# Patient Record
Sex: Male | Born: 1955
Health system: Southern US, Community
[De-identification: ages and names within clinical notes are randomized; demographics above are authoritative.]

## PROBLEM LIST (undated history)

## (undated) DIAGNOSIS — Z87891 Personal history of nicotine dependence: Secondary | ICD-10-CM

## (undated) DIAGNOSIS — C3492 Malignant neoplasm of unspecified part of left bronchus or lung: Secondary | ICD-10-CM

## (undated) DIAGNOSIS — I639 Cerebral infarction, unspecified: Secondary | ICD-10-CM

## (undated) HISTORY — DX: Malignant neoplasm of unspecified part of left bronchus or lung: C34.92

---

## 2017-04-26 ENCOUNTER — Encounter (HOSPITAL_COMMUNITY): Payer: Self-pay | Admitting: Emergency Medicine

## 2017-04-26 ENCOUNTER — Ambulatory Visit (HOSPITAL_COMMUNITY)
Admission: EM | Admit: 2017-04-26 | Discharge: 2017-04-26 | Disposition: A | Discharge status: Home / Self Care | Payer: Self-pay | Attending: Internal Medicine | Admitting: Internal Medicine

## 2017-04-26 DIAGNOSIS — J029 Acute pharyngitis, unspecified: Secondary | ICD-10-CM

## 2017-04-26 MED ORDER — AMOXICILLIN 500 MG PO CAPS
500.0000 mg | ORAL_CAPSULE | Freq: Three times a day (TID) | ORAL | 0 refills | Status: DC
Start: 2017-04-26 — End: 2017-07-05

## 2017-04-26 NOTE — ED Provider Notes (Signed)
CSN: [id number]     Arrival date & time 04/26/17  1343 History   None    Chief Complaint  Patient presents with  . Sore Throat   (Consider location/radiation/quality/duration/timing/severity/associated sxs/prior Treatment) The history is provided by the patient. No language interpreter was used.  Sore Throat  This is a new problem. The problem occurs constantly. The problem has not changed since onset.Nothing aggravates the symptoms. Nothing relieves the symptoms. He has tried nothing for the symptoms. The treatment provided no relief.  Pt complains of a sore throat.  Pt has pain with swallowing  History reviewed. No pertinent past medical history. History reviewed. No pertinent surgical history. History reviewed. No pertinent family history. Social History  Substance Use Topics  . Smoking status: Former Research scientist (life sciences)  . Smokeless tobacco: Not on file     Comment: quit smoking in 2017  . Alcohol use Yes    Review of Systems  All other systems reviewed and are negative.   Allergies  Patient has no known allergies.  Home Medications   Prior to Admission medications   Medication Sig Start Date End Date Taking? Authorizing Provider  amoxicillin (AMOXIL) 500 MG capsule Take 1 capsule (500 mg total) by mouth 3 (three) times daily. 04/26/17   Fransico Meadow, PA-C   Meds Ordered and Administered this Visit  Medications - No data to display  BP (!) 206/113 (BP Location: Right Arm) Comment: rn notified  Pulse 99   Temp 98.9 F (37.2 C) (Oral)   Resp 16   SpO2 100%  No data found.   Physical Exam  Constitutional: He appears well-developed and well-nourished.  HENT:  Head: Normocephalic and atraumatic.  Right Ear: External ear normal.  Left Ear: External ear normal.  Nose: Nose normal.  Erythema throat, no exudate, raspy voice.   Eyes: Conjunctivae are normal.  Neck: Neck supple.  Cardiovascular: Normal rate and regular rhythm.   No murmur heard. Pulmonary/Chest: Effort  normal and breath sounds normal. No respiratory distress.  Abdominal: Soft. There is no tenderness.  Musculoskeletal: He exhibits no edema.  Neurological: He is alert.  Skin: Skin is warm and dry.  Psychiatric: He has a normal mood and affect.  Nursing note and vitals reviewed.   Urgent Care Course     Procedures (including critical care time)  Labs Review Labs Reviewed - No data to display  Imaging Review No results found.   Visual Acuity Review  Right Eye Distance:   Left Eye Distance:   Bilateral Distance:    Right Eye Near:   Left Eye Near:    Bilateral Near:         MDM   1. Pharyngitis, unspecified etiology    Meds ordered this encounter  Medications  . amoxicillin (AMOXIL) 500 MG capsule    Sig: Take 1 capsule (500 mg total) by mouth 3 (three) times daily.    Dispense:  30 capsule    Refill:  0    Order Specific Question:   Supervising Provider    Answer:   Sherlene Shams [[id number]]  An After Visit Summary was printed and given to the patient.     Fransico Meadow, [place] 04/26/17 [phone]

## 2017-04-26 NOTE — ED Triage Notes (Signed)
Sore throat that started Wednesday and voice is raspy.  Throat hurts with a lot of talking

## 2017-04-26 NOTE — Discharge Instructions (Signed)
Return if any problems.

## 2017-07-05 ENCOUNTER — Ambulatory Visit (HOSPITAL_COMMUNITY)
Admission: EM | Admit: 2017-07-05 | Discharge: 2017-07-05 | Disposition: A | Discharge status: Nursing Home (Includes ICF) | Payer: Self-pay | Attending: Internal Medicine | Admitting: Internal Medicine

## 2017-07-05 ENCOUNTER — Encounter (HOSPITAL_COMMUNITY): Payer: Self-pay | Admitting: Emergency Medicine

## 2017-07-05 ENCOUNTER — Inpatient Hospital Stay (HOSPITAL_COMMUNITY)
Admission: EM | Admit: 2017-07-05 | Discharge: 2017-07-08 | DRG: 167 | Disposition: A | Discharge status: Home / Self Care | Payer: Self-pay | Attending: Internal Medicine | Admitting: Internal Medicine

## 2017-07-05 ENCOUNTER — Ambulatory Visit (INDEPENDENT_AMBULATORY_CARE_PROVIDER_SITE_OTHER): Payer: Self-pay

## 2017-07-05 ENCOUNTER — Emergency Department (HOSPITAL_COMMUNITY): Payer: Self-pay

## 2017-07-05 DIAGNOSIS — J9801 Acute bronchospasm: Secondary | ICD-10-CM

## 2017-07-05 DIAGNOSIS — Q278 Other specified congenital malformations of peripheral vascular system: Secondary | ICD-10-CM

## 2017-07-05 DIAGNOSIS — R9389 Abnormal findings on diagnostic imaging of other specified body structures: Secondary | ICD-10-CM

## 2017-07-05 DIAGNOSIS — R0982 Postnasal drip: Secondary | ICD-10-CM

## 2017-07-05 DIAGNOSIS — R000 Tachycardia, unspecified: Secondary | ICD-10-CM | POA: Diagnosis present

## 2017-07-05 DIAGNOSIS — R042 Hemoptysis: Secondary | ICD-10-CM | POA: Diagnosis present

## 2017-07-05 DIAGNOSIS — R0602 Shortness of breath: Secondary | ICD-10-CM

## 2017-07-05 DIAGNOSIS — R030 Elevated blood-pressure reading, without diagnosis of hypertension: Secondary | ICD-10-CM | POA: Diagnosis present

## 2017-07-05 DIAGNOSIS — J9811 Atelectasis: Secondary | ICD-10-CM | POA: Diagnosis present

## 2017-07-05 DIAGNOSIS — R499 Unspecified voice and resonance disorder: Secondary | ICD-10-CM

## 2017-07-05 DIAGNOSIS — R938 Abnormal findings on diagnostic imaging of other specified body structures: Secondary | ICD-10-CM

## 2017-07-05 DIAGNOSIS — I10 Essential (primary) hypertension: Secondary | ICD-10-CM | POA: Diagnosis present

## 2017-07-05 DIAGNOSIS — J9859 Other diseases of mediastinum, not elsewhere classified: Secondary | ICD-10-CM

## 2017-07-05 DIAGNOSIS — R490 Dysphonia: Secondary | ICD-10-CM | POA: Diagnosis present

## 2017-07-05 DIAGNOSIS — Z825 Family history of asthma and other chronic lower respiratory diseases: Secondary | ICD-10-CM

## 2017-07-05 DIAGNOSIS — Z87891 Personal history of nicotine dependence: Secondary | ICD-10-CM

## 2017-07-05 DIAGNOSIS — C3412 Malignant neoplasm of upper lobe, left bronchus or lung: Principal | ICD-10-CM | POA: Diagnosis present

## 2017-07-05 HISTORY — DX: Personal history of nicotine dependence: Z87.891

## 2017-07-05 LAB — CBC WITH DIFFERENTIAL/PLATELET
BASOS PCT: 1 %
Basophils Absolute: 0 10*3/uL (ref 0.0–0.1)
EOS ABS: 0.2 10*3/uL (ref 0.0–0.7)
EOS PCT: 2 %
HCT: 46.4 % (ref 39.0–52.0)
HEMOGLOBIN: 15.8 g/dL (ref 13.0–17.0)
Lymphocytes Relative: 21 %
Lymphs Abs: 1.7 10*3/uL (ref 0.7–4.0)
MCH: 32.8 pg (ref 26.0–34.0)
MCHC: 34.1 g/dL (ref 30.0–36.0)
MCV: 96.5 fL (ref 78.0–100.0)
MONOS PCT: 9 %
Monocytes Absolute: 0.7 10*3/uL (ref 0.1–1.0)
NEUTROS PCT: 67 %
Neutro Abs: 5.5 10*3/uL (ref 1.7–7.7)
PLATELETS: 312 10*3/uL (ref 150–400)
RBC: 4.81 MIL/uL (ref 4.22–5.81)
RDW: 13.7 % (ref 11.5–15.5)
WBC: 8.2 10*3/uL (ref 4.0–10.5)

## 2017-07-05 LAB — COMPREHENSIVE METABOLIC PANEL
ALBUMIN: 4.1 g/dL (ref 3.5–5.0)
ALK PHOS: 86 U/L (ref 38–126)
ALT: 15 U/L — ABNORMAL LOW (ref 17–63)
ANION GAP: 9 (ref 5–15)
AST: 21 U/L (ref 15–41)
BUN: <5 mg/dL — ABNORMAL LOW (ref 6–20)
CALCIUM: 9.5 mg/dL (ref 8.9–10.3)
CHLORIDE: 96 mmol/L — AB (ref 101–111)
CO2: 31 mmol/L (ref 22–32)
Creatinine, Ser: 0.84 mg/dL (ref 0.61–1.24)
GFR calc non Af Amer: >60 mL/min (ref >60)
GLUCOSE: 103 mg/dL — AB (ref 65–99)
Potassium: 3.5 mmol/L (ref 3.5–5.1)
SODIUM: 136 mmol/L (ref 135–145)
Total Bilirubin: 1 mg/dL (ref 0.3–1.2)
Total Protein: 7.5 g/dL (ref 6.5–8.1)

## 2017-07-05 MED ORDER — HYDRALAZINE HCL 20 MG/ML IJ SOLN: 5.0000 mg | INTRAMUSCULAR | Status: DC | PRN

## 2017-07-05 MED ORDER — ACETAMINOPHEN 325 MG PO TABS: 650.0000 mg | ORAL_TABLET | Freq: Four times a day (QID) | ORAL | Status: DC | PRN

## 2017-07-05 MED ORDER — ACETAMINOPHEN 650 MG RE SUPP: 650.0000 mg | Freq: Four times a day (QID) | RECTAL | Status: DC | PRN

## 2017-07-05 MED ORDER — ONDANSETRON HCL 4 MG/2ML IJ SOLN: 4.0000 mg | Freq: Three times a day (TID) | INTRAMUSCULAR | Status: DC | PRN

## 2017-07-05 MED ORDER — ZOLPIDEM TARTRATE 5 MG PO TABS: 5.0000 mg | ORAL_TABLET | Freq: Every evening | ORAL | Status: DC | PRN

## 2017-07-05 MED ORDER — ALBUTEROL SULFATE (2.5 MG/3ML) 0.083% IN NEBU
INHALATION_SOLUTION | RESPIRATORY_TRACT | Status: AC
  Filled 2017-07-05: qty 3

## 2017-07-05 MED ORDER — IPRATROPIUM-ALBUTEROL 0.5-2.5 (3) MG/3ML IN SOLN
3.0000 mL | Freq: Once | RESPIRATORY_TRACT | Status: AC
  Administered 2017-07-05: 3 mL via RESPIRATORY_TRACT
  Filled 2017-07-05: qty 3

## 2017-07-05 MED ORDER — IPRATROPIUM-ALBUTEROL 0.5-2.5 (3) MG/3ML IN SOLN
3.0000 mL | Freq: Once | RESPIRATORY_TRACT | Status: AC
  Administered 2017-07-05: 3 mL via RESPIRATORY_TRACT

## 2017-07-05 MED ORDER — IPRATROPIUM-ALBUTEROL 0.5-2.5 (3) MG/3ML IN SOLN
RESPIRATORY_TRACT | Status: AC
  Filled 2017-07-05: qty 3

## 2017-07-05 MED ORDER — SODIUM CHLORIDE 0.9 % IV SOLN
INTRAVENOUS | Status: DC
  Administered 2017-07-05 – 2017-07-07 (×4): via INTRAVENOUS

## 2017-07-05 MED ORDER — AMLODIPINE BESYLATE 5 MG PO TABS
5.0000 mg | ORAL_TABLET | Freq: Every day | ORAL | Status: DC
  Administered 2017-07-05 – 2017-07-06 (×2): 5 mg via ORAL
  Filled 2017-07-05 (×2): qty 1

## 2017-07-05 MED ORDER — GUAIFENESIN 100 MG/5ML PO SYRP
200.0000 mg | ORAL_SOLUTION | ORAL | Status: DC | PRN
  Filled 2017-07-05: qty 10

## 2017-07-05 MED ORDER — DEXAMETHASONE SODIUM PHOSPHATE 10 MG/ML IJ SOLN
10.0000 mg | Freq: Once | INTRAMUSCULAR | Status: AC
  Administered 2017-07-05: 10 mg via INTRAVENOUS
  Filled 2017-07-05: qty 1

## 2017-07-05 MED ORDER — LEVALBUTEROL HCL 1.25 MG/0.5ML IN NEBU
1.2500 mg | INHALATION_SOLUTION | Freq: Four times a day (QID) | RESPIRATORY_TRACT | Status: DC
  Filled 2017-07-05 (×2): qty 0.5

## 2017-07-05 MED ORDER — IOPAMIDOL (ISOVUE-300) INJECTION 61%
INTRAVENOUS | Status: AC
  Administered 2017-07-05: 100 mL
  Filled 2017-07-05: qty 75

## 2017-07-05 NOTE — ED Notes (Signed)
Radiology called needing to give report on chest x-ray... Notified Youlanda Roys, NP who sts he has seen the report already.

## 2017-07-05 NOTE — H&P (Signed)
History and Physical    Levi Brown QPY:[id number] DOB: July 28, 1956 DOA: 07/05/2017  Referring MD/NP/PA:   PCP: Patient, No Pcp Per   Patient coming from:  The patient is coming from home.  At baseline, pt is independent for most of ADL.   Chief Complaint: Shortness of breath, cough, hemoptysis and hoarseness  HPI: Levi Brown is a 61 y.o. male without significant past medical history except for former smoking, who presents with shortness of breath, cough, hemoptysis or hoarseness.   Patient states that he has been having dry cough, SOB, hoarse voice for 6 weeks, which has been progressively getting worse. Patient does not have chest pain. No fever or chills. Occasionally he has mild hemoptysis with blood-streaked sputum. No night sweats. He was treated with Amoxicillin recently with no improvement in his symptoms. No calf tenderness. He denies PND or orthopnea. Patient does not have nausea, vomiting, diarrhea, abdominal pain, symptoms of UTI or unilateral weakness.  Pt was seen in urgent care and found to have significantly abnormal chest x-ray concerning for thoracic aortic aneurysm versus cancer, therefore sent to ED for further evaluation and treatment. Patient states that he lost 5 pounds in the past 5 months. Patient states that he does not have a primary care physician. He smoked 30-pack-years and quit 1.5 years ago.   ED Course: pt was found to have WBC 22, electrolytes renal function okay, temperature 98.6, tachycardia, oxygen saturation 93% on room air. Patient is admitted to telemetry bed as inpatient.  CT of the chest with contrast showed: 1. 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 There is also mass effect and significant narrowing of the left main pulmonary artery. Differential considerations include lymphoma, thymic  malignancy, and potentially small cell lung cancer. No evidence of distant metastatic disease. 2. Complete collapse of the left upper lobe. It is difficult to identify where the primary mediastinal mass ends and atelectatic lung begins. Direct invasion into the lung is not excluded.  3. 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 MSK: No muscle spasm, no deformity, no limitation of range of movement in spin Heme: No easy bruising.  Travel history: No recent long distant travel.  Allergy: No Known Allergies  Past Medical History:  Diagnosis Date  . Former smoker     History reviewed. No pertinent surgical history.  Social History:  reports that he has quit smoking. He has never used smokeless tobacco. He reports that he drinks alcohol. He reports that he does not use drugs.  Family History:  Family History  Problem Relation Age of Onset  . Asthma Mother   . COPD Mother      Prior to Admission medications   Medication Sig Start Date End Date Taking? Authorizing Provider  amoxicillin (AMOXIL) 500 MG capsule Take 1 capsule (500 mg total) by mouth 3 (three) times daily. 04/26/17  Fransico Meadow, [place]    Physical Exam: Vitals:   07/05/17 2215 07/05/17 2258 07/05/17 2300 07/05/17 2317  BP: (!) 178/115 (!) 183/120 (!) 183/120   Pulse: (!) 106  99   Resp:   16   Temp:   98.4 F (36.9 C)   TempSrc:   Oral   SpO2: 93%  93%   Weight:    73 kg (160 lb  14.4 oz)  Height:   5\' 6"  (1.676 m)    General: Not in acute distress HEENT:       Eyes: PERRL, EOMI, no scleral icterus.       ENT: No discharge from the ears and nose, no pharynx injection, no tonsillar enlargement.        Neck: No JVD, no bruit, no mass felt. Heme: No neck lymph node enlargement. Cardiac: S1/S2, RRR, No murmurs, No gallops or rubs. Respiratory: has decreased air movement on the left side. No rales, wheezing, rhonchi or rubs. GI: Soft, nondistended, nontender, no rebound pain, no organomegaly, BS present. GU: No hematuria Ext: No pitting leg edema bilaterally. 2+DP/PT pulse bilaterally. Musculoskeletal: No joint deformities, No joint redness or warmth, no limitation of ROM in spin. Skin: No rashes.  Neuro: Alert, oriented X3, cranial nerves II-XII grossly intact, moves all extremities normally. Psych: Patient is not psychotic, no suicidal or hemocidal ideation.  Labs on Admission: I have personally reviewed following labs and imaging studies  CBC:  Recent Labs Lab 07/05/17 1759  WBC 8.2  NEUTROABS 5.5  HGB 15.8  HCT 46.4  MCV 96.5  PLT 778   Basic Metabolic Panel:  Recent Labs Lab 07/05/17 1759  NA 136  K 3.5  CL 96*  CO2 31  GLUCOSE 103*  BUN <5*  CREATININE 0.84  CALCIUM 9.5   GFR: Estimated Creatinine Clearance: 85.4 mL/min (by C-G formula based on SCr of 0.84 mg/dL). Liver Function Tests:  Recent Labs Lab 07/05/17 1759  AST 21  ALT 15*  ALKPHOS 86  BILITOT 1.0  PROT 7.5  ALBUMIN 4.1   No results for input(s): LIPASE, AMYLASE in the last 168 hours. No results for input(s): AMMONIA in the last 168 hours. Coagulation Profile:  Recent Labs Lab 07/05/17 2346  INR 1.02   Cardiac Enzymes: No results for input(s): CKTOTAL, CKMB, CKMBINDEX, TROPONINI in the last 168 hours. BNP (last 3 results) No results for input(s): PROBNP in the last 8760 hours. HbA1C: No results for input(s): HGBA1C in the last 72 hours. CBG: No results  for input(s): GLUCAP in the last 168 hours. Lipid Profile: No results for input(s): CHOL, HDL, LDLCALC, TRIG, CHOLHDL, LDLDIRECT in the last 72 hours. Thyroid Function Tests: No results for input(s): TSH, T4TOTAL, FREET4, T3FREE, THYROIDAB in the last 72 hours. Anemia Panel: No results for input(s): VITAMINB12, FOLATE, FERRITIN, TIBC, IRON, RETICCTPCT in the last 72 hours. Urine analysis: No results found for: COLORURINE, APPEARANCEUR, LABSPEC, PHURINE, GLUCOSEU, HGBUR, BILIRUBINUR, KETONESUR, PROTEINUR, UROBILINOGEN, NITRITE, LEUKOCYTESUR Sepsis Labs: @LABRCNTIP (procalcitonin:4,lacticidven:4) )No results found for this or any previous visit (from the past 240 hour(s)).   Radiological Exams on Admission: Dg Chest 2 View  Result Date: 07/05/2017 CLINICAL DATA:  Shortness of breath and wheezing. EXAM: CHEST  2 VIEW COMPARISON:  None. FINDINGS: Marked enlargement of the mediastinum and the patient may even have a right aortic arch. However, the mediastinal tissue is more prominent on the left side. Trachea is midline. Heart size appears to be grossly normal. There is mild elevation of the left  hemidiaphragm. Lungs are clear without airspace disease or pulmonary edema. No large pleural effusions. IMPRESSION: Enlargement and abnormal appearance of the mediastinum. Differential diagnosis would include a mediastinal/lung mass, lymphadenopathy and/or thoracic aortic aneurysm. Recommend further characterization with a chest CT with IV contrast. These results will be called to the ordering clinician or representative by the Radiologist Assistant, and communication documented in the PACS or zVision Dashboard. Electronically Signed   By: Markus Daft M.D.   On: 07/05/2017 13:20   Ct Chest W Contrast  Result Date: 07/05/2017 CLINICAL DATA:  61 year old male with shortness of breath for the past month and hoarseness for the past month and a half EXAM: CT CHEST WITH CONTRAST TECHNIQUE: Multidetector CT imaging of  the chest was performed during intravenous contrast administration. CONTRAST:  195mL ISOVUE-300 IOPAMIDOL (ISOVUE-300) INJECTION 61% COMPARISON:  Chest x-ray obtained earlier today FINDINGS: Cardiovascular: Right-sided aortic arch with aberrant left subclavian artery. Diffuse narrowing of the left main pulmonary artery secondary to local mass effect from the mediastinal mass. No evidence of aortic aneurysm. The heart is normal in size. No pericardial effusion. Mediastinum/Nodes: Ill-defined mediastinal mass which is difficult to measure precisely. The mass measures at least 6.5 x 5.2 x 6.6 cm and is contiguous with collapse of the left upper lobe. The mass completely obliterates the left upper lobe bronchus and likely impinges upon the left lower lobe bronchus. No additional mediastinal adenopathy identified. Lungs/Pleura: Complete collapse of the left upper lobe. There is no aeration within the left upper lobe. Secondary hyperinflation of the left lower lobe. The aerated portion of the lungs are clear. No pulmonary mass or nodules. Upper Abdomen: Visualized upper abdominal organs are unremarkable. Musculoskeletal: No acute fracture or aggressive appearing lytic or blastic osseous lesion. IMPRESSION: 1. 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 There is also mass effect and significant narrowing of the left main pulmonary artery. Differential considerations include lymphoma, thymic malignancy, and potentially small cell lung cancer. No evidence of distant metastatic disease. 2. Complete collapse of the left upper lobe. It is difficult to identify where the primary mediastinal mass ends and atelectatic lung begins. Direct invasion into the lung is not excluded. 3. 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 Electronically Signed   By: Jacqulynn Cadet M.D.   On: 07/05/2017 20:58     EKG: Independently reviewed.  Sinus rhythm, QTC 482, T-wave inversion in V1-V2, RAE.   Assessment/Plan Principal Problem:   Mediastinal mass Active Problems:   Elevated blood pressure reading   Mediastinal mass: Patient's shortness of breath, hemoptysis and hoarseness are due to mediastinal mass as shown by CT of chest. It is very concerning for malignancy, ifferential considerations include lymphoma, thymic malignancy, and potentially small cell lung cancer. No evidence of distant metastatic disease on CT scan.  -Will admit to tele bed as inpt -prn . Albuterol nebulizers for SOB -Robitussin when necessary for cough -check LDH and peripheral smear -pt received one dose of Decadron in ED -check INR/PTT -please call oncology in AM.  Elevated blood pressure reading: Blood pressure is elevated to 180/117. Patient may have undiagnosed hypertension -Start amlodipine 5 mg daily -IVF hydration when necessary  DVT ppx: SCD Code Status: Full code Family Communication: Yes, patient's sister  at bed side Disposition Plan:  Anticipate discharge back to previous home environment Consults called: none  Admission status:  Inpatient/tele    Date of Service 07/06/2017    Ivor Costa [facility] Pager [phone]  If 7PM-7AM, please contact night-coverage [email or web address] Password [email or web address] 07/06/2017, 1:20 AM

## 2017-07-05 NOTE — ED Provider Notes (Signed)
[facility] DEPT Provider Note   CSN: [id number] Arrival date & time: 07/05/17  1441     History   Chief Complaint Chief Complaint  Patient presents with  . Laryngitis     HPI   Blood pressure (!) 179/109, pulse 100, temperature 98.6 F (37 C), temperature source Oral, resp. rate 15, SpO2 99 %.  Levi Brown is a 61 y.o. male sent from urgent care for further evaluation of significantly abnormal chest x-ray, findings concerning for [place] differential includes a thoracic aortic aneurysm versus cancer. Patient states that he does not have a primary care physician, he smoked 30-pack-years and quit 1.5 years ago. He states that he developed a hoarse voice 1.5 months ago, he was seen and given a prescription for an antibiotic which he completed with no improvement in his symptoms. He reports a shortness of breath with increasing bilateral peripheral edema onset several weeks ago. He denies any unilateral swelling, calf pain. He states that he had difficulty sleeping last night because he felt so short of breath, he denies PND or orthopnea. He denies chest pain, syncope. He is not on any medications, he states that he feels improvement in his shortness of breath after nebulizer treatment at urgent care. He states that the shortness of breath is exacerbated by exertion, he works doing Artist. He denies unintentional weight loss, easy bruising or bleeding (he notes that he's had a blood-streaked sputum on his cough and the AM over the last week), night sweats.   Past Medical History:  Diagnosis Date  . Former smoker     Patient Active Problem List   Diagnosis Date Noted  . 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 Elevated blood pressure reading 07/05/2017    History reviewed. No pertinent surgical history.     Home Medications    Prior to Admission medications   Medication Sig Start Date End Date Taking? Authorizing Provider  acetaminophen (TYLENOL) 325 MG tablet  Take 325 mg by mouth every 6 (six) hours as needed for moderate pain.   Yes [provider]  Dextromethorphan Polistirex (COUGH DM PO) Take 30 mLs by mouth daily as needed (cough).   Yes [provider]  Multiple Vitamins-Minerals (DRY EYE FORMULA PO) Place 1 drop into both eyes daily.   Yes [provider]    Family History Family History  Problem Relation Age of Onset  . Asthma Mother   . COPD Mother     Social History Social History  Substance Use Topics  . Smoking status: Former Research scientist (life sciences)  . Smokeless tobacco: Never Used     Comment: quit smoking in 2017  . Alcohol use Yes     Allergies   Patient has no known allergies.   Review of Systems Review of Systems  A complete review of systems was obtained and all systems are negative except as noted in the HPI and PMH.   Physical Exam Updated Vital Signs BP (!) 183/120 (BP Location: Left Arm)   Pulse 99   Temp 98.4 F (36.9 C) (Oral)   Resp 16   Ht 5\' 6"  (1.676 m)   Wt 73 kg (160 lb 14.4 oz) Comment: a scale  SpO2 93%   BMI 25.97 kg/m   Physical Exam  Constitutional: He is oriented to person, place, and time. He appears well-developed and well-nourished. No distress.  HENT:  Head: Normocephalic and atraumatic.  Mouth/Throat: Oropharynx is clear and moist.  Eyes: Pupils are equal, round, and reactive to light.  Conjunctivae and EOM are normal.  Neck: Normal range of motion.  Cardiovascular: Normal rate, regular rhythm and intact distal pulses.   Pulses equal bilaterally.  Pulmonary/Chest: Effort normal. He has wheezes.  Scattered expiratory wheezing  Abdominal: Soft. There is no tenderness.  Musculoskeletal: Normal range of motion. He exhibits edema.  1+ edema to midshin bilaterally, Homans sign negative  Neurological: He is alert and oriented to person, place, and time.  Skin: He is not diaphoretic.  Psychiatric: He has a normal mood and affect.  Nursing note and vitals  reviewed.    ED Treatments / Results  Labs (all labs ordered are listed, but only abnormal results are displayed) Labs Reviewed  COMPREHENSIVE METABOLIC PANEL - Abnormal; Notable for the following:       Result Value   Chloride 96 (*)    Glucose, Bld 103 (*)    BUN <5 (*)    ALT 15 (*)    All other components within normal limits  CBC WITH DIFFERENTIAL/PLATELET  LACTATE DEHYDROGENASE  PATHOLOGIST SMEAR REVIEW  SAVE SMEAR  PROTIME-INR  APTT  HIV ANTIBODY (ROUTINE TESTING)  BASIC METABOLIC PANEL  CBC    EKG  EKG Interpretation None       Radiology Dg Chest 2 View  Result Date: 07/05/2017 CLINICAL DATA:  Shortness of breath and wheezing. EXAM: CHEST  2 VIEW COMPARISON:  None. FINDINGS: Marked enlargement of the mediastinum and the patient may even have a right aortic arch. However, the mediastinal tissue is more prominent on the left side. Trachea is midline. Heart size appears to be grossly normal. There is mild elevation of the left hemidiaphragm. Lungs are clear without airspace disease or pulmonary edema. No large pleural effusions. IMPRESSION: Enlargement and abnormal appearance of the mediastinum. Differential diagnosis would include a mediastinal/lung mass, lymphadenopathy and/or thoracic aortic aneurysm. Recommend further characterization with a chest CT with IV contrast. These results will be called to the ordering clinician or representative by the Radiologist Assistant, and communication documented in the PACS or zVision Dashboard. Electronically Signed   By: Markus Daft M.D.   On: 07/05/2017 13:20   Ct Chest W Contrast  Result Date: 07/05/2017 CLINICAL DATA:  61 year old male with shortness of breath for the past month and hoarseness for the past month and a half EXAM: CT CHEST WITH CONTRAST TECHNIQUE: Multidetector CT imaging of the chest was performed during intravenous contrast administration. CONTRAST:  142mL ISOVUE-300 IOPAMIDOL (ISOVUE-300) INJECTION 61%  COMPARISON:  Chest x-ray obtained earlier today FINDINGS: Cardiovascular: Right-sided aortic arch with aberrant left subclavian artery. Diffuse narrowing of the left main pulmonary artery secondary to local mass effect from the mediastinal mass. No evidence of aortic aneurysm. The heart is normal in size. No pericardial effusion. Mediastinum/Nodes: Ill-defined mediastinal mass which is difficult to measure precisely. The mass measures at least 6.5 x 5.2 x 6.6 cm and is contiguous with collapse of the left upper lobe. The mass completely obliterates the left upper lobe bronchus and likely impinges upon the left lower lobe bronchus. No additional mediastinal adenopathy identified. Lungs/Pleura: Complete collapse of the left upper lobe. There is no aeration within the left upper lobe. Secondary hyperinflation of the left lower lobe. The aerated portion of the lungs are clear. No pulmonary mass or nodules. Upper Abdomen: Visualized upper abdominal organs are unremarkable. Musculoskeletal: No acute fracture or aggressive appearing lytic or blastic osseous lesion. IMPRESSION: 1. 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 There is also mass effect and significant narrowing of the left main pulmonary artery. Differential considerations include lymphoma, thymic malignancy, and potentially small cell lung cancer. No evidence of distant metastatic disease. 2. Complete collapse of the left upper lobe. It is difficult to identify where the primary mediastinal mass ends and atelectatic lung begins. Direct invasion into the lung is not excluded. 3. 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 Electronically Signed   By: Jacqulynn Cadet M.D.   On: 07/05/2017 20:58    Procedures Procedures (including critical care time)  Medications Ordered in ED Medications   hydrALAZINE (APRESOLINE) injection 5 mg (not administered)  ondansetron (ZOFRAN) injection 4 mg (not administered)  amLODipine (NORVASC) tablet 5 mg (not administered)  levalbuterol (XOPENEX) nebulizer solution 1.25 mg (not administered)  guaifenesin (ROBITUSSIN) 100 MG/5ML syrup 200 mg (not administered)  0.9 %  sodium chloride infusion (not administered)  acetaminophen (TYLENOL) tablet 650 mg (not administered)    Or  acetaminophen (TYLENOL) suppository 650 mg (not administered)  zolpidem (AMBIEN) tablet 5 mg (not administered)  dexamethasone (DECADRON) injection 10 mg (10 mg Intravenous Given 07/05/17 1902)  ipratropium-albuterol (DUONEB) 0.5-2.5 (3) MG/3ML nebulizer solution 3 mL (3 mLs Nebulization Given 07/05/17 1902)  iopamidol (ISOVUE-300) 61 % injection (100 mLs  Contrast Given 07/05/17 2030)     Initial Impression / Assessment and Plan / ED Course  I have reviewed the triage vital signs and the nursing notes.  Pertinent labs & imaging results that were available during my care of the patient were reviewed by me and considered in my medical decision making (see chart for details).     Vitals:   07/05/17 2215 07/05/17 2258 07/05/17 2300 07/05/17 2317  BP: (!) 178/115 (!) 183/120 (!) 183/120   Pulse: (!) 106  99   Resp:   16   Temp:   98.4 F (36.9 C)   TempSrc:   Oral   SpO2: 93%  93%   Weight:    73 kg (160 lb 14.4 oz)  Height:   5\' 6"  (1.676 m)     Medications  hydrALAZINE (APRESOLINE) injection 5 mg (not administered)  ondansetron (ZOFRAN) injection 4 mg (not administered)  amLODipine (NORVASC) tablet 5 mg (not administered)  levalbuterol (XOPENEX) nebulizer solution 1.25 mg (not administered)  guaifenesin (ROBITUSSIN) 100 MG/5ML syrup 200 mg (not administered)  0.9 %  sodium chloride infusion (not administered)  acetaminophen (TYLENOL) tablet 650 mg (not administered)    Or  acetaminophen (TYLENOL) suppository 650 mg (not administered)  zolpidem (AMBIEN)  tablet 5 mg (not administered)  dexamethasone (DECADRON) injection 10 mg (10 mg Intravenous Given 07/05/17 1902)  ipratropium-albuterol (DUONEB) 0.5-2.5 (3) MG/3ML nebulizer solution 3 mL (3 mLs Nebulization Given 07/05/17 1902)  iopamidol (ISOVUE-300) 61 % injection (100 mLs  Contrast Given 07/05/17 2030)    Levi Brown is 61 y.o. male presenting with Hoarse voice, increasing peripheral edema, shortness of breath and grossly abnormal chest x-ray from urgent care with widened mediastinum. Patient with no chest pain, equal pulses, extensive smoking history, likely cancer. Patient's blood pressure is elevated he states that he's never had a diagnosis of hypertension and he is nervous given the abnormal chest x-ray. Will further evaluate with chest CT, I invited him to call family members to come to the emergency department for support. Patient is still wheezing, will give Decadron and second nebulizer treatment. Blood work pending prior  to CT  CAT scan with a 6-1/2 cm amorphous mass in the left mediastinum. Differential includes lymphoma versus thymic malignancy versus small cell lung cancer. No evidence of metastatic disease. Pilar Plate discussion with patient and his sister, will need admission for biopsy and staging. Patient with no insurance or outpatient care. Unassigned admission to [facility] hospitalist Dr. Blaine Hamper.    Final Clinical Impressions(s) / ED Diagnoses   Final diagnoses:  Mediastinal mass    New Prescriptions Current Discharge Medication List       Waynetta Pean 07/05/17 2331    Davonna Belling, MD 07/06/17 0002

## 2017-07-05 NOTE — Discharge Instructions (Signed)
To go to emergency department now for evaluation of chest abnormalities.

## 2017-07-05 NOTE — ED Provider Notes (Signed)
CSN: [id number]     Arrival date & time 07/05/17  1102 History   First MD Initiated Contact with Patient 07/05/17 1224     Chief Complaint  Patient presents with  . Shortness of Breath   (Consider location/radiation/quality/duration/timing/severity/associated sxs/prior Treatment) 61 year old male complaining of change of voice since May and a foreign body sensation in throat. States he lost his voice several weeks ago. Denies perceived drainage. Denies shortness of breath although he states it is hard to get a good breath. In addition he states that he frequently spits up blood, but especially in the morning when he gets up or brushes his teeth. He does not smoke he does not have asthma. 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 Currently no acute distress. Denies chest pain, abdominal pain, nausea or vomiting, focal weakness or paresthesias.      History reviewed. No pertinent past medical history. History reviewed. No pertinent surgical history. History reviewed. No pertinent family history. Social History  Substance Use Topics  . Smoking status: Former Research scientist (life sciences)  . Smokeless tobacco: Never Used     Comment: quit smoking in 2017  . Alcohol use Yes    Review of Systems  Constitutional: Positive for activity change. Negative for fever.  HENT: Positive for sore throat and voice change. Negative for congestion, ear pain and postnasal drip.   Eyes: Negative.   Respiratory: Negative for cough.        Denies having shortness of breath but states it is hard to breathe and get a good breath.  Cardiovascular: Negative for chest pain.  Gastrointestinal: Negative.   Neurological: Negative.   All other systems reviewed and are negative.   Allergies  Patient has no known allergies.  Home Medications   Prior to Admission medications   Medication Sig Start Date End Date Taking? Authorizing Provider  amoxicillin (AMOXIL) 500 MG capsule Take 1 capsule (500 mg total) by  mouth 3 (three) times daily. 04/26/17   Fransico Meadow, PA-C   Meds Ordered and Administered this Visit   Medications  ipratropium-albuterol (DUONEB) 0.5-2.5 (3) MG/3ML nebulizer solution 3 mL (3 mLs Nebulization Given 07/05/17 1317)    BP 104/75 (BP Location: Left Arm)   Pulse (!) 116   Temp 98.5 F (36.9 C) (Oral)   Resp 20   SpO2 95%  No data found.   Physical Exam  Constitutional: He is oriented to person, place, and time. He appears well-developed and well-nourished. No distress.  HENT:  Oropharynx with minor erythema and scant clear PND, no exudate.  Eyes: EOM are normal.  Neck: Normal range of motion. Neck supple. No thyromegaly present.  Cardiovascular: Normal rate, regular rhythm and normal heart sounds.   Pulmonary/Chest: Effort normal. No respiratory distress.  Breath sounds with expiratory wheeze bilaterally. Prolonged expiratory phase. No rhonchi. Minor coarseness with forced cough.  Musculoskeletal: He exhibits no edema.  Lymphadenopathy:    He has no cervical adenopathy.  Neurological: He is alert and oriented to person, place, and time. He exhibits normal muscle tone.  Skin: Skin is warm and dry.  Psychiatric: He has a normal mood and affect.  Nursing note and vitals reviewed.   Urgent Care Course     Procedures (including critical care time)  Labs Review Labs Reviewed - No data to display  Imaging Review Dg Chest 2 View  Result Date: 07/05/2017 CLINICAL DATA:  Shortness of breath and wheezing. EXAM: CHEST  2 VIEW COMPARISON:  None. FINDINGS: Marked enlargement of  the mediastinum and the patient may even have a right aortic arch. However, the mediastinal tissue is more prominent on the left side. Trachea is midline. Heart size appears to be grossly normal. There is mild elevation of the left hemidiaphragm. Lungs are clear without airspace disease or pulmonary edema. No large pleural effusions. IMPRESSION: Enlargement and abnormal appearance of the  mediastinum. Differential diagnosis would include a mediastinal/lung mass, lymphadenopathy and/or thoracic aortic aneurysm. Recommend further characterization with a chest CT with IV contrast. These results will be called to the ordering clinician or representative by the Radiologist Assistant, and communication documented in the PACS or zVision Dashboard. Electronically Signed   By: Markus Daft M.D.   On: 07/05/2017 13:20     Visual Acuity Review  Right Eye Distance:   Left Eye Distance:   Bilateral Distance:    Right Eye Near:   Left Eye Near:    Bilateral Near:         MDM   1. SOB (shortness of breath)   2. Bronchospasm   3. PND (post-nasal drip)   4. Change in voice   5. New abnormality on chest x-ray    Patient is being discharged to [facility] department due to abnormalities of the chest x-ray suggesting possible thoracic aortic aneurysm versus mass/lymphadenopathy possible metastatic disease. Patient does not have a PCP to follow up with. Currently no chest pain. He is breathing better after the DuoNeb. He looks generally well, stable no chest pain or shortness of breath.   Janne Napoleon, NP 07/05/17 1418

## 2017-07-05 NOTE — ED Triage Notes (Signed)
Pt here for SOB onset 1 month ... sts he "feels like there is something is stuck in throat"  Sts he was here on 04/26/17 for similar sx  Sx also include prod cough and hoarseness  Denies odynophagia, dyspnea  A&O x4... NAD... Ambulatory

## 2017-07-05 NOTE — ED Triage Notes (Signed)
Pt. Stated, I've had laryngitis for over a month. Ive seen a doctor for it and they sent me here.

## 2017-07-05 NOTE — ED Notes (Signed)
Pt back from CT

## 2017-07-05 NOTE — ED Notes (Signed)
Patient transported to CT 

## 2017-07-05 NOTE — ED Notes (Signed)
Pt given PO fluids, ok per [place], [place]

## 2017-07-06 ENCOUNTER — Encounter (HOSPITAL_COMMUNITY): Payer: Self-pay | Admitting: Hematology and Oncology

## 2017-07-06 ENCOUNTER — Inpatient Hospital Stay (HOSPITAL_COMMUNITY): Payer: Self-pay

## 2017-07-06 DIAGNOSIS — J9859 Other diseases of mediastinum, not elsewhere classified: Secondary | ICD-10-CM

## 2017-07-06 LAB — PHOSPHORUS: Phosphorus: 3 mg/dL (ref 2.5–4.6)

## 2017-07-06 LAB — BASIC METABOLIC PANEL
Anion gap: 8 (ref 5–15)
BUN: 6 mg/dL (ref 6–20)
CHLORIDE: 97 mmol/L — AB (ref 101–111)
CO2: 30 mmol/L (ref 22–32)
CREATININE: 0.9 mg/dL (ref 0.61–1.24)
Calcium: 9.2 mg/dL (ref 8.9–10.3)
GFR calc Af Amer: >60 mL/min (ref >60)
GFR calc non Af Amer: >60 mL/min (ref >60)
Glucose, Bld: 156 mg/dL — ABNORMAL HIGH (ref 65–99)
Potassium: 4 mmol/L (ref 3.5–5.1)
SODIUM: 135 mmol/L (ref 135–145)

## 2017-07-06 LAB — HIV ANTIBODY (ROUTINE TESTING W REFLEX): HIV SCREEN 4TH GENERATION: NONREACTIVE

## 2017-07-06 LAB — CBC
HCT: 44 % (ref 39.0–52.0)
Hemoglobin: 14.4 g/dL (ref 13.0–17.0)
MCH: 31.9 pg (ref 26.0–34.0)
MCHC: 32.7 g/dL (ref 30.0–36.0)
MCV: 97.6 fL (ref 78.0–100.0)
PLATELETS: 297 10*3/uL (ref 150–400)
RBC: 4.51 MIL/uL (ref 4.22–5.81)
RDW: 14.1 % (ref 11.5–15.5)
WBC: 4.3 10*3/uL (ref 4.0–10.5)

## 2017-07-06 LAB — URIC ACID: Uric Acid, Serum: 6.1 mg/dL (ref 4.4–7.6)

## 2017-07-06 LAB — APTT: aPTT: 35 s (ref 24–36)

## 2017-07-06 LAB — PROTIME-INR
INR: 1.02
Prothrombin Time: 13.4 seconds (ref 11.4–15.2)

## 2017-07-06 LAB — SAVE SMEAR

## 2017-07-06 LAB — LACTATE DEHYDROGENASE: LDH: 213 U/L — ABNORMAL HIGH (ref 98–192)

## 2017-07-06 MED ORDER — LEVALBUTEROL HCL 1.25 MG/0.5ML IN NEBU
1.2500 mg | INHALATION_SOLUTION | Freq: Four times a day (QID) | RESPIRATORY_TRACT | Status: DC
  Administered 2017-07-06: 1.25 mg via RESPIRATORY_TRACT
  Filled 2017-07-06: qty 0.5

## 2017-07-06 MED ORDER — IOPAMIDOL (ISOVUE-300) INJECTION 61%
INTRAVENOUS | Status: AC
  Administered 2017-07-06: 30 mL
  Filled 2017-07-06: qty 30

## 2017-07-06 MED ORDER — IOPAMIDOL (ISOVUE-300) INJECTION 61%
INTRAVENOUS | Status: AC
  Administered 2017-07-06: 30 mL
  Filled 2017-07-06: qty 100

## 2017-07-06 MED ORDER — METOPROLOL TARTRATE 12.5 MG HALF TABLET
12.5000 mg | ORAL_TABLET | Freq: Two times a day (BID) | ORAL | Status: DC
  Administered 2017-07-06 – 2017-07-08 (×4): 12.5 mg via ORAL
  Filled 2017-07-06 (×4): qty 1

## 2017-07-06 MED ORDER — AMLODIPINE BESYLATE 5 MG PO TABS
5.0000 mg | ORAL_TABLET | Freq: Every day | ORAL | Status: DC
  Administered 2017-07-06 – 2017-07-08 (×3): 5 mg via ORAL
  Filled 2017-07-06 (×3): qty 1

## 2017-07-06 MED ORDER — LEVALBUTEROL HCL 1.25 MG/0.5ML IN NEBU
1.2500 mg | INHALATION_SOLUTION | Freq: Three times a day (TID) | RESPIRATORY_TRACT | Status: DC
Start: 2017-07-06 — End: 2017-07-08
  Filled 2017-07-06 (×5): qty 0.5

## 2017-07-06 MED ORDER — ALBUTEROL SULFATE (2.5 MG/3ML) 0.083% IN NEBU: 2.5000 mg | INHALATION_SOLUTION | RESPIRATORY_TRACT | Status: DC | PRN

## 2017-07-06 MED ORDER — IOPAMIDOL (ISOVUE-300) INJECTION 61%
INTRAVENOUS | Status: AC
  Administered 2017-07-06: 100 mL
  Filled 2017-07-06: qty 100

## 2017-07-06 NOTE — Progress Notes (Signed)
Pulmonology seen the patient, note says planning for biopsy somepoint but does not say when, tried to be in touch with MD since pt is still in NPO, will continue to monitor the patient

## 2017-07-06 NOTE — Progress Notes (Signed)
Imaging reviewed by Dr. Earleen Newport.  After review, he feels a bronchoscopy would be the best first step.  I will contact the ordering provider to let him know.  Vennesa Bastedo E 2:23 PM 07/06/2017

## 2017-07-06 NOTE — Assessment & Plan Note (Addendum)
61 year old male with new presentation with coarse voice, dyspnea on exertion, cough, and hemoptysis in the setting of a large infiltrative mass lesion in the left mediastinum, hilum, and pulmonary involvement. Current contralateral pulmonary disease, additional lymphadenopathy, or evidence of metastasis in the visualized skeletal structures and liver. Most likely, we're dealing with a malignancy, completion staging as needed and tissue diagnosis is of paramount significance. She'll include small cell lung cancer versus lymphoma. Other malignancies are possible, but I'm only slightly lower and my differential based on location in the mediastinum. 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  LDH is minimally elevated which can be present in any number of aggressive tumors and by itself does help much to differentiate the diagnosis. CT of the abdomen and pelvis was obtained last night and showed no evidence of metastatic disease in the liver, retroperitoneum, or adrenal glands. No abnormalities skeletal structures.  Recommendations: --No significant changes in my recommendations from yesterday. --Proceed with bronchoscopic evaluation and biopsies to obtain tissue diagnosis --Recommend MRI of the brain  --If patient is found to have a lymphoma or small cell lung cancer, we may consider initiation of the cancer-directed therapy prior to discharge from the hospital due to significant symptoms experienced by the patient.  Thank you for involving [place] in the care of this patient, our service will continue follow-up and we'll update our recommendations as more information becomes available.

## 2017-07-06 NOTE — Progress Notes (Signed)
Client was notified that IV was beeping due to him bending Right arm. Advised client to try to keep arm as straight as possible. Eryck Negron SN

## 2017-07-06 NOTE — Progress Notes (Signed)
PROGRESS NOTE [facility]   Levi Brown   OEU:[id number] DOB: Mar 04, 1956  DOA: 07/05/2017 PCP: Patient, No Pcp Per   Brief Narrative:  61 yo AA male without significant PMH. Former 1 pack/day 30 pack year smoker quite over 1 year ago, orginially seen at urgent care and sent to [facility] for abnormal CXR findings. Presented to the ED with SOB, foreign body sensation in throat, and coughing up blood. States that he frequently spits up blood especially after waking up. Symptoms have been persistent for about 6 weeks and getting worse. A trail of amoxacillin was given with no improvement of symptoms. Breathing tx at urgent care help the SOB. Admits to increasing bilateral peripheral edema that started several weeks ago. Denied chest pain, abdominal pain, nausea, vomiting, focal weakness and paresthesias in the ED.   Subjective: 7-24 He is sitting up in bed comfortable with no complaints and states that he feels better. He has had his scan results discussed with him, he appears to be handling the news well. Admits cough and hoarseness.  Denies fever, dizziness, chest pain, sob, abdominal pain, NVD, LE pain or swelling. pulmonary consulted    Assessment & Plan:   Principal Problem:   Mediastinal mass Active Problems:   Elevated blood pressure reading  Shortness of breath complicated by a Mediastinal Mass  - consult pulmonary for biopsy  - continue breathing tx   Essential hypertension   - continue norvasc  DVT prophylaxis: scd Code Status: full Family Communication: no Disposition Plan: 7/25 pending pulmonary consult    Consultants:   Pulmonary  Procedures:   none  Antimicrobials:  none   Objective: Vitals:   07/06/17 0545 07/06/17 0755 07/06/17 0812 07/06/17 1140  BP: (!) 167/110 (!) 166/117  (!) 157/109  Pulse: 98 96  100  Resp:  16  20  Temp: 98.9 F (37.2 C) 98.3 F (36.8 C)  99.1 F (37.3 C)  TempSrc: Oral Oral  Oral  SpO2: 95% 97% 94% 96%  Weight: 72.9  kg (160 lb 11.2 oz)     Height:        Intake/Output Summary (Last 24 hours) at 07/06/17 1302 Last data filed at 07/06/17 1109  Gross per 24 hour  Intake              300 ml  Output              800 ml  Net             -500 ml   Filed Weights   07/05/17 2317 07/06/17 0545  Weight: 73 kg (160 lb 14.4 oz) 72.9 kg (160 lb 11.2 oz)    Examination:  General exam: Appears calm and comfortable  HEENT: NCAT, PERRL, OP moist and clear Respiratory system: Clear to auscultation. No wheezes,crackle or rhonchi Cardiovascular system: S1 & S2 heard, RRR. No JVD, murmurs, rubs or gallops Gastrointestinal system: Abdomen is nondistended, soft and nontender. Normal bowel sounds heard. Central nervous system: Alert and oriented. No focal neurological deficits. Extremities: No pedal edema.  Skin: No rashes, lesions or ulcers Psychiatry: Judgement and insight appear normal. Mood & affect appropriate.    Data Reviewed: I have personally reviewed following labs and imaging studies  CBC:  Recent Labs Lab 07/05/17 1759 07/06/17 0357  WBC 8.2 4.3  NEUTROABS 5.5  --   HGB 15.8 14.4  HCT 46.4 44.0  MCV 96.5 97.6  PLT 312 154   Basic Metabolic Panel:  Recent Labs Lab 07/05/17  1759 07/06/17 0357  NA 136 135  K 3.5 4.0  CL 96* 97*  CO2 31 30  GLUCOSE 103* 156*  BUN <5* 6  CREATININE 0.84 0.90  CALCIUM 9.5 9.2   GFR: Estimated Creatinine Clearance: 79.8 mL/min (by C-G formula based on SCr of 0.9 mg/dL). Liver Function Tests:  Recent Labs Lab 07/05/17 1759  AST 21  ALT 15*  ALKPHOS 86  BILITOT 1.0  PROT 7.5  ALBUMIN 4.1   No results for input(s): LIPASE, AMYLASE in the last 168 hours. No results for input(s): AMMONIA in the last 168 hours. Coagulation Profile:  Recent Labs Lab 07/05/17 2346  INR 1.02   Cardiac Enzymes: No results for input(s): CKTOTAL, CKMB, CKMBINDEX, TROPONINI in the last 168 hours. BNP (last 3 results) No results for input(s): PROBNP in the last  8760 hours. HbA1C: No results for input(s): HGBA1C in the last 72 hours. CBG: No results for input(s): GLUCAP in the last 168 hours. Lipid Profile: No results for input(s): CHOL, HDL, LDLCALC, TRIG, CHOLHDL, LDLDIRECT in the last 72 hours. Thyroid Function Tests: No results for input(s): TSH, T4TOTAL, FREET4, T3FREE, THYROIDAB in the last 72 hours. Anemia Panel: No results for input(s): VITAMINB12, FOLATE, FERRITIN, TIBC, IRON, RETICCTPCT in the last 72 hours. Sepsis Labs: No results for input(s): PROCALCITON, LATICACIDVEN in the last 168 hours.  No results found for this or any previous visit (from the past 240 hour(s)).       Radiology Studies: Dg Chest 2 View  Result Date: 07/05/2017 CLINICAL DATA:  Shortness of breath and wheezing. EXAM: CHEST  2 VIEW COMPARISON:  None. FINDINGS: Marked enlargement of the mediastinum and the patient may even have a right aortic arch. However, the mediastinal tissue is more prominent on the left side. Trachea is midline. Heart size appears to be grossly normal. There is mild elevation of the left hemidiaphragm. Lungs are clear without airspace disease or pulmonary edema. No large pleural effusions. IMPRESSION: Enlargement and abnormal appearance of the mediastinum. Differential diagnosis would include a mediastinal/lung mass, lymphadenopathy and/or thoracic aortic aneurysm. Recommend further characterization with a chest CT with IV contrast. These results will be called to the ordering clinician or representative by the Radiologist Assistant, and communication documented in the PACS or zVision Dashboard. Electronically Signed   By: Markus Daft M.D.   On: 07/05/2017 13:20   Ct Chest W Contrast  Result Date: 07/05/2017 CLINICAL DATA:  61 year old male with shortness of breath for the past month and hoarseness for the past month and a half EXAM: CT CHEST WITH CONTRAST TECHNIQUE: Multidetector CT imaging of the chest was performed during intravenous contrast  administration. CONTRAST:  168mL ISOVUE-300 IOPAMIDOL (ISOVUE-300) INJECTION 61% COMPARISON:  Chest x-ray obtained earlier today FINDINGS: Cardiovascular: Right-sided aortic arch with aberrant left subclavian artery. Diffuse narrowing of the left main pulmonary artery secondary to local mass effect from the mediastinal mass. No evidence of aortic aneurysm. The heart is normal in size. No pericardial effusion. Mediastinum/Nodes: Ill-defined mediastinal mass which is difficult to measure precisely. The mass measures at least 6.5 x 5.2 x 6.6 cm and is contiguous with collapse of the left upper lobe. The mass completely obliterates the left upper lobe bronchus and likely impinges upon the left lower lobe bronchus. No additional mediastinal adenopathy identified. Lungs/Pleura: Complete collapse of the left upper lobe. There is no aeration within the left upper lobe. Secondary hyperinflation of the left lower lobe. The aerated portion of the lungs are clear. No pulmonary mass  or nodules. Upper Abdomen: Visualized upper abdominal organs are unremarkable. Musculoskeletal: No acute fracture or aggressive appearing lytic or blastic osseous lesion. IMPRESSION: 1. 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 There is also mass effect and significant narrowing of the left main pulmonary artery. Differential considerations include lymphoma, thymic malignancy, and potentially small cell lung cancer. No evidence of distant metastatic disease. 2. Complete collapse of the left upper lobe. It is difficult to identify where the primary mediastinal mass ends and atelectatic lung begins. Direct invasion into the lung is not excluded. 3. 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 Electronically Signed   By: Jacqulynn Cadet M.D.   On: 07/05/2017 20:58      Scheduled  Meds: . amLODipine  5 mg Oral Daily  . levalbuterol  1.25 mg Nebulization TID   Continuous Infusions: . sodium chloride 75 mL/hr at 07/06/17 1109     LOS: 1 day    Time spent:     Mitzie Na, PA-S Pager: Text Page via [email or web address]  [phone]  If 7PM-7AM, please contact night-coverage [email or web address] Password [email or web address] 07/06/2017, 1:02 PM

## 2017-07-06 NOTE — Consult Note (Signed)
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 Current contralateral pulmonary disease, additional lymphadenopathy, or evidence of metastasis in the visualized skeletal structures and liver. Most likely, we're dealing with a malignancy, completion staging as needed and tissue diagnosis is of paramount significance. She'll include small cell lung cancer versus lymphoma. Other malignancies are possible, but I'm only slightly lower and my differential based on location in the mediastinum. 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  [place] is largely nonrevealing. LDH is minimally elevated which can be present in any number of aggressive tumors and by itself does help much to differentiate the diagnosis.  Recommendations: --Agree with obtaining a biopsy by the most expeditious route. Bronchoscopy with transbronchial biopsy will likely serve the purpose. --Recommend MRI of the brain and CT of the abdomen and pelvis to complete initial staging --If patient is found to have a lymphoma or small cell lung cancer, we may consider initiation of the cancer-directed therapy prior to discharge from the hospital due to significant symptoms experienced by the patient.  Thank you for involving [place] in the care of this patient, our service will continue follow-up and we'll update our recommendations as more information becomes available.   Orders Placed This Encounter  Procedures  . CT Chest W Contrast    Standing Status:   Standing    Number of Occurrences:   1    Order  Specific Question:   Reason for Exam (SYMPTOM  OR DIAGNOSIS REQUIRED)    Answer:   abnormal    Order Specific Question:   Does the patient have a contrast media/X-ray dye allergy?    Answer:   No    Order Specific Question:   If indicated for the ordered procedure, I authorize the administration of contrast media per Radiology protocol    Answer:   Yes    Order Specific Question:   Radiology Contrast Protocol - do NOT remove file path    Answer:   [email or web address]  . CBC with Differential/Platelet    Standing Status:   Standing    Number of Occurrences:   1  . Comprehensive metabolic panel    Standing Status:   Standing    Number of Occurrences:   1  . Lactate dehydrogenase    Standing Status:   Standing    Number of Occurrences:   1  . Pathologist smear review    Standing Status:   Standing    Number of Occurrences:   1  . Save smear    Standing Status:   Standing    Number of Occurrences:   1  . Protime-INR    Standing Status:   Standing    Number of Occurrences:   1  . APTT    Standing Status:   Standing    Number of Occurrences:   1  . HIV antibody (Routine Testing)    Standing Status:   Standing    Number of Occurrences:   1  . Basic metabolic panel    Standing Status:   Standing    Number of Occurrences:   1  . CBC  Standing Status:   Standing    Number of Occurrences:   1  . Diet Heart Room service appropriate? Yes; Fluid consistency: Thin    Standing Status:   Standing    Number of Occurrences:   1    Order Specific Question:   Room service appropriate?    Answer:   Yes    Order Specific Question:   Fluid consistency:    Answer:   Thin  . Diet NPO time specified    Standing Status:   Standing    Number of Occurrences:   1  . Nursing communication    Labs are showing as collected but not in process. Can you please call lab and make sure they have the samples and running them? Thank you    Standing Status:   Standing    Number of  Occurrences:   1  . Expected discharge Date: 07/07/2017; Planned Disposition: Home    Standing Status:   Standing    Number of Occurrences:   1    Order Specific Question:   Date    Answer:   07/07/2017    Order Specific Question:   Planned Disposition    Answer:   Home  . Vital signs    Standing Status:   Standing    Number of Occurrences:   1  . Notify physician    Standing Status:   Standing    Number of Occurrences:   20    Order Specific Question:   Notify Physician    Answer:   for pulse less than 55 or greater than 120    Order Specific Question:   Notify Physician    Answer:   for respiratory rate less than 12 or greater than 25    Order Specific Question:   Notify Physician    Answer:   for temperature greater than 100.5 F    Order Specific Question:   Notify Physician    Answer:   for urinary output less than 30 mL/kg/hr for four hours    Order Specific Question:   Notify Physician    Answer:   for systolic BP less than 90 or greater than 287, diastolic BP less than 60 or greater than 100  . RN may order General Admission PRN Orders utilizing "General Admission PRN medications" (through manage orders) for the following patient needs: allergy symptoms (Claritin), cold sores (Carmex), cough (Robitussin DM), eye irritation (Liquifilm Tears), hemorrhoids (Tucks), indigestion (Maalox), minor skin irritation (Hydrocortisone Cream), muscle pain Suezanne Jacquet Gay), nose irritation (saline nasal spray) and sore throat (Chloraseptic spray).    Standing Status:   Standing    Number of Occurrences:   [id number]  . SCDs    Standing Status:   Standing    Number of Occurrences:   1    Order Specific Question:   Laterality    Answer:   Bilateral  . Cardiac Monitoring Continuous x 48 hours Indications for use: Syncope of unknown etiology, Other; Other indications for use: Tachycardia    Standing Status:   Standing    Number of Occurrences:   1    Order Specific Question:   Indications for use:     Answer:   Syncope of unknown etiology    Order Specific Question:   Indications for use:    Answer:   Other    Order Specific Question:   Other indications for use:    Answer:   Tachycardia  . Up as tolerated  Standing Status:   Standing    Number of Occurrences:   1  . Full code    Standing Status:   Standing    Number of Occurrences:   1  . Pulse oximetry check with vital signs    Standing Status:   Standing    Number of Occurrences:   1  . EKG 12-Lead    Once on exam    Standing Status:   Standing    Number of Occurrences:   1    Order Specific Question:   Reason for Exam    Answer:   sob  . EKG 12-Lead    Standing Status:   Standing    Number of Occurrences:   1  . Insert peripheral IV    Standing Status:   Standing    Number of Occurrences:   1  . Admit to Inpatient (patient's expected length of stay will be greater than 2 midnights or inpatient only procedure)    Standing Status:   Standing    Number of Occurrences:   1    Order Specific Question:   Hospital Area    Answer:   [facility] [100100]    Order Specific Question:   Level of Care    Answer:   Telemetry [5]    Order Specific Question:   Diagnosis    Answer:   Mediastinal mass [161096]    Order Specific Question:   Admitting Physician    Answer:   Ivor Costa [[id number]]    Order Specific Question:   Attending Physician    Answer:   Ivor Costa [[id number]]    Order Specific Question:   Estimated length of stay    Answer:   past midnight tomorrow    Order Specific Question:   Certification:    Answer:   I certify this patient will need inpatient services for at least 2 midnights    Order Specific Question:   PT Class (Do Not Modify)    Answer:   Inpatient [101]    Order Specific Question:   PT Acc Code (Do Not Modify)    Answer:   Private [1]    All questions were answered.  . The patient knows to call the clinic with any problems, questions or concerns.  This note was electronically signed.     History of Presenting Illness Levi Brown 61 y.o. referred for consultation by Dr Carolin Sicks for assistance in evaluation of left-side hilar/mediastinal mass. Patient initially presented with complaints of changes in voice, cough, and hemoptysis yesterday. He reports that the hoarseness of the voice has started at the end of May 2018. He is subsequently developed of, progressive dyspnea with exertion, and eventually hemoptysis since the mid-June. Patient denies having any fevers, chills, night sweats. Denies any significant changes to his activity level, he has continued working through the symptoms although he did need to take more rest. He did not have any headaches, vision changes, focal weakness or severe deficits, and copies, or seizures. He denies dysphagia, although he does have persistent sensation of fullness in his throat. He denies abdominal pain, nausea, vomiting, diarrhea, or constipation. Denies chest pain, palpitations. No shortness of breath at rest. He did have a single episode of swelling in bilateral ankles a few days ago, but no persistent swelling in the lower extremities. He denies any new skin rashes. No urinary symptoms.  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 The mass measured 6.6 cm in the largest dimension. No additional lymphadenopathy was visualized in the mediastinum, supraclavicular areas, and no additional pulmonary masses on the contralateral side. The visualized portion of the liver was normal. Currently, patient continues to have his symptoms although they are improved. He did have an adverse reaction with lightheadedness and dizziness to administration of antihypertensives. Evaluated by pulmonology and is tentatively planned for possible bronchoscopy to obtain tissue  samples.  Medical History: Past Medical History:  Diagnosis Date  . Former smoker    Quit smoking in early 2017    Surgical History: History reviewed. No pertinent surgical history.  Family History: Family History  Problem Relation Age of Onset  . Asthma Mother   . COPD Mother   . Cancer Neg Hx     Social History: Social History   Social History  . Marital status: Single    Spouse name: N/A  . Number of children: N/A  . Years of education: N/A   Occupational History  . Not on file.   Social History Main Topics  . Smoking status: Former Smoker    Packs/day: 1.00    Years: 30.00    Types: Cigarettes    Quit date: 2017  . Smokeless tobacco: Never Used     Comment: quit smoking in 2017  . Alcohol use 8.4 - 12.6 oz/week    14 - 21 Cans of beer per week  . Drug use: No  . Sexual activity: Not on file   Other Topics Concern  . Not on file   Social History Narrative  . No narrative on file    Allergies: No Known Allergies  Medications:  Current Facility-Administered Medications  Medication Dose Route Frequency Provider Last Rate Last Dose  . 0.9 %  sodium chloride infusion   Intravenous Continuous Ivor Costa, MD 75 mL/hr at 07/06/17 1109    . acetaminophen (TYLENOL) tablet 650 mg  650 mg Oral Q6H PRN Ivor Costa, MD       Or  . acetaminophen (TYLENOL) suppository 650 mg  650 mg Rectal Q6H PRN Ivor Costa, MD      . albuterol (PROVENTIL) (2.5 MG/3ML) 0.083% nebulizer solution 2.5 mg  2.5 mg Nebulization Q4H PRN Rosita Fire, MD      . amLODipine (NORVASC) tablet 5 mg  5 mg Oral Daily Rosita Fire, MD   5 mg at 07/06/17 1434  . guaifenesin (ROBITUSSIN) 100 MG/5ML syrup 200 mg  200 mg Oral Q4H PRN Ivor Costa, MD      . hydrALAZINE (APRESOLINE) injection 5 mg  5 mg Intravenous Q2H PRN Ivor Costa, MD      . levalbuterol Penne Lash) nebulizer solution 1.25 mg  1.25 mg Nebulization TID Rosita Fire, MD      . ondansetron [facility]) injection 4  mg  4 mg Intravenous Q8H PRN Ivor Costa, MD      . zolpidem (AMBIEN) tablet 5 mg  5 mg Oral QHS PRN Ivor Costa, MD        Review of Systems: Review of Systems  All other systems reviewed and are negative.    PHYSICAL EXAMINATION Blood pressure (!) 168/100, pulse 100, temperature 99.1 F (37.3 C), temperature source Oral, resp. rate 20, height '5\' 6"'$  (1.676 m), weight 160 lb 11.2 oz (72.9 kg), SpO2 96 %.  ECOG PERFORMANCE  STATUS: 1 - Symptomatic but completely ambulatory  Physical Exam  Constitutional: He is oriented to person, place, and time and well-developed, well-nourished, and in no distress. He appears not lethargic and not jaundiced. He appears not cachectic. No distress.  Vital signs reviewed, hypertension noted.  HENT:  Head: Normocephalic.  Mouth/Throat: Oropharynx is clear and moist and mucous membranes are normal. Normal dentition.  Eyes: Pupils are equal, round, and reactive to light. Conjunctivae and EOM are normal. No scleral icterus.  Neck: No JVD present. Carotid bruit is not present. No thyroid mass and no thyromegaly present.  Cardiovascular: Regular rhythm and normal heart sounds.  Tachycardia present.   No murmur heard. No peripheral edema presently  Pulmonary/Chest: No stridor. He has no decreased breath sounds. He has no wheezes. He has no rhonchi. He has no rales.  Decreased air entry on the left side especially in the upper portion of the lung. Coarse breath sounds.  Abdominal: Soft. Normal appearance. There is no hepatosplenomegaly. There is no tenderness.  Musculoskeletal:  No skeletal abnormalities, no tenderness to palpation of the bony prominences.  Lymphadenopathy:  No palpable lymphadenopathy in the submandibular, occipital, cervical, supraclavicular, axillary, and inguinal regions  Neurological: He is oriented to person, place, and time. He appears not lethargic.  No gross focal neurological deficits  Skin: He is not diaphoretic.  No skin rash,  petechiae, or ecchymosis.     LABORATORY DATA: I have personally reviewed the data as listed: Admission on 07/05/2017  Component Date Value Ref Range Status  . WBC 07/05/2017 8.2  4.0 - 10.5 K/uL Final  . RBC 07/05/2017 4.81  4.22 - 5.81 MIL/uL Final  . Hemoglobin 07/05/2017 15.8  13.0 - 17.0 g/dL Final  . HCT 07/05/2017 46.4  39.0 - 52.0 % Final  . MCV 07/05/2017 96.5  78.0 - 100.0 fL Final  . MCH 07/05/2017 32.8  26.0 - 34.0 pg Final  . MCHC 07/05/2017 34.1  30.0 - 36.0 g/dL Final  . RDW 07/05/2017 13.7  11.5 - 15.5 % Final  . Platelets 07/05/2017 312  150 - 400 K/uL Final  . Neutrophils Relative % 07/05/2017 67  % Final  . Neutro Abs 07/05/2017 5.5  1.7 - 7.7 K/uL Final  . Lymphocytes Relative 07/05/2017 21  % Final  . Lymphs Abs 07/05/2017 1.7  0.7 - 4.0 K/uL Final  . Monocytes Relative 07/05/2017 9  % Final  . Monocytes Absolute 07/05/2017 0.7  0.1 - 1.0 K/uL Final  . Eosinophils Relative 07/05/2017 2  % Final  . Eosinophils Absolute 07/05/2017 0.2  0.0 - 0.7 K/uL Final  . Basophils Relative 07/05/2017 1  % Final  . Basophils Absolute 07/05/2017 0.0  0.0 - 0.1 K/uL Final  . Sodium 07/05/2017 136  135 - 145 mmol/L Final  . Potassium 07/05/2017 3.5  3.5 - 5.1 mmol/L Final  . Chloride 07/05/2017 96* 101 - 111 mmol/L Final  . CO2 07/05/2017 31  22 - 32 mmol/L Final  . Glucose, Bld 07/05/2017 103* 65 - 99 mg/dL Final  . BUN 07/05/2017 <5* 6 - 20 mg/dL Final  . Creatinine, Ser 07/05/2017 0.84  0.61 - 1.24 mg/dL Final  . Calcium 07/05/2017 9.5  8.9 - 10.3 mg/dL Final  . Total Protein 07/05/2017 7.5  6.5 - 8.1 g/dL Final  . Albumin 07/05/2017 4.1  3.5 - 5.0 g/dL Final  . AST 07/05/2017 21  15 - 41 U/L Final  . ALT 07/05/2017 15* 17 - 63 U/L Final  .  Alkaline Phosphatase 07/05/2017 86  38 - 126 U/L Final  . Total Bilirubin 07/05/2017 1.0  0.3 - 1.2 mg/dL Final  . GFR calc non Af Amer 07/05/2017 >60  >60 mL/min Final  . GFR calc Af Amer 07/05/2017 >60  >60 mL/min Final   Comment:  (NOTE) The eGFR has been calculated using the CKD EPI equation. This calculation has not been validated in all clinical situations. eGFR's persistently <60 mL/min signify possible Chronic Kidney Disease.   . Anion gap 07/05/2017 9  5 - 15 Final  . LDH 07/05/2017 213* 98 - 192 U/L Final   SLIGHT HEMOLYSIS  . Smear Review 07/05/2017 SMEAR STAINED AND AVAILABLE FOR REVIEW   Final  . Prothrombin Time 07/05/2017 13.4  11.4 - 15.2 seconds Final  . INR 07/05/2017 1.02   Final  . aPTT 07/05/2017 35  24 - 36 seconds Final  . Sodium 07/06/2017 135  135 - 145 mmol/L Final  . Potassium 07/06/2017 4.0  3.5 - 5.1 mmol/L Final  . Chloride 07/06/2017 97* 101 - 111 mmol/L Final  . CO2 07/06/2017 30  22 - 32 mmol/L Final  . Glucose, Bld 07/06/2017 156* 65 - 99 mg/dL Final  . BUN 07/06/2017 6  6 - 20 mg/dL Final  . Creatinine, Ser 07/06/2017 0.90  0.61 - 1.24 mg/dL Final  . Calcium 07/06/2017 9.2  8.9 - 10.3 mg/dL Final  . GFR calc non Af Amer 07/06/2017 >60  >60 mL/min Final  . GFR calc Af Amer 07/06/2017 >60  >60 mL/min Final   Comment: (NOTE) The eGFR has been calculated using the CKD EPI equation. This calculation has not been validated in all clinical situations. eGFR's persistently <60 mL/min signify possible Chronic Kidney Disease.   . Anion gap 07/06/2017 8  5 - 15 Final  . WBC 07/06/2017 4.3  4.0 - 10.5 K/uL Final  . RBC 07/06/2017 4.51  4.22 - 5.81 MIL/uL Final  . Hemoglobin 07/06/2017 14.4  13.0 - 17.0 g/dL Final  . HCT 07/06/2017 44.0  39.0 - 52.0 % Final  . MCV 07/06/2017 97.6  78.0 - 100.0 fL Final  . MCH 07/06/2017 31.9  26.0 - 34.0 pg Final  . MCHC 07/06/2017 32.7  30.0 - 36.0 g/dL Final  . RDW 07/06/2017 14.1  11.5 - 15.5 % Final  . Platelets 07/06/2017 297  150 - 400 K/uL Final         Ardath Sax, MD

## 2017-07-06 NOTE — Consult Note (Signed)
Name: DARLY FAILS MRN: [id number] DOB: 08/29/1956    ADMISSION DATE:  07/05/2017 CONSULTATION DATE:  07/06/2017  REFERRING MD :  Ivor Costa  CHIEF COMPLAINT: Shortness of breath, cough, hemoptysis and hoarseness   BRIEF PATIENT DESCRIPTION:  AA Male  sitting in chair in NAD on RA. Very hoarse voice.  SIGNIFICANT EVENTS  07/05/2017>> Admit  STUDIES:  07/05/2017>>CT Chest with contrast : Large, amorphous approximately 6.6 x 6.5 x 5.2 cm mass centered in the left mediastinum which completely obliterates the left upper lobe bronchus and impinges into the left lower lobe bronchus. There is also mass effect and significant narrowing of the left main pulmonary artery. Differential considerations include lymphoma, thymic malignancy, and potentially small cell lung cancer. No evidence of distant metastatic disease. 2. Complete collapse of the left upper lobe. It is difficult to identify where the primary mediastinal mass ends and atelectatic lung begins. Direct invasion into the lung is not excluded.  3. 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 male without significant past medical history except for former smoker, who presents to the ED 07/05/2017 with shortness of breath, cough, hemoptysis or hoarseness.   Patient states that he has been having dry cough, SOB, hoarse voice for 6 weeks, which has been progressively getting worse. Patient does not have chest pain. No fever or chills. Occasionally he has mild hemoptysis with blood-streaked sputum. No night sweats. He was treated with Amoxicillin recently with no improvement in his symptoms. No calf tenderness. He denies PND or orthopnea. Patient does not have nausea, vomiting, diarrhea, abdominal pain, symptoms of UTI or unilateral weakness.  Pt was seen in urgent care and found to have significantly abnormal chest x-ray concerning for thoracic aortic aneurysm versus  cancer, therefore sent to ED for further evaluation and treatment. Patient states that he lost 5 pounds in the past 5 months. Patient states that he does not have a primary care physician. He smoked 30-pack-years and quit 1.5 years ago. He works in the Pharmacist, community , [employer].  ED Course: pt was found to have WBC 22, electrolytes renal function okay, temperature 98.6, tachycardia, oxygen saturation 93% on room air. Patient is admitted to telemetry bed as inpatient for further work up.  CCM were consulted to assist with management of mediastinal mass  PAST MEDICAL HISTORY :   has a past medical history of Former smoker.  has no past surgical history on file. Prior to Admission medications   Medication Sig Start Date End Date Taking? Authorizing Provider  acetaminophen (TYLENOL) 325 MG tablet Take 325 mg by mouth every 6 (six) hours as needed for moderate pain.   Yes [provider]  Dextromethorphan Polistirex (COUGH DM PO) Take 30 mLs by mouth daily as needed (cough).   Yes [provider]  Multiple Vitamins-Minerals (DRY EYE FORMULA PO) Place 1 drop into both eyes daily.   Yes [provider]   No Known Allergies  FAMILY HISTORY:  family history includes Asthma in his mother; COPD in his mother. SOCIAL HISTORY:  reports that he has quit smoking. He has never used smokeless tobacco. He reports that he drinks alcohol. He reports that he does not use drugs.  REVIEW OF SYSTEMS:   Constitutional: Negative for fever, chills, + weight loss, malaise/fatigue and diaphoresis.  HENT: Negative for hearing loss, ear pain, nosebleeds, congestion, sore throat, neck pain, tinnitus and ear discharge.  Eyes: Negative for blurred vision, double vision, photophobia, pain, discharge and redness.  Respiratory: + for cough, hemoptysis, sputum production, shortness of breath, no wheezing and stridor.   Cardiovascular: Negative for chest pain, palpitations, orthopnea,  claudication, leg swelling and PND.  Gastrointestinal: Negative for heartburn, nausea, vomiting, abdominal pain, diarrhea, constipation, blood in stool and melena.+ decrease in appetite  Genitourinary: Negative for dysuria, urgency, frequency, hematuria and flank pain.  Musculoskeletal: Negative for myalgias, back pain, joint pain and falls.  Skin: Negative for itching and rash.  Neurological: Negative for dizziness, tingling, tremors, sensory change, speech change, focal weakness, seizures, loss of consciousness, weakness and headaches.  Endo/Heme/Allergies: Negative for environmental allergies and polydipsia. Does not bruise/bleed easily.  SUBJECTIVE:   VITAL SIGNS: Temp:  [98.3 F (36.8 C)-98.9 F (37.2 C)] 98.3 F (36.8 C) (07/24 0755) Pulse Rate:  [94-116] 96 (07/24 0755) Resp:  [15-20] 16 (07/24 0755) BP: (104-186)/(75-141) 166/117 (07/24 0755) SpO2:  [92 %-100 %] 94 % (07/24 0812) Weight:  [160 lb 11.2 oz (72.9 kg)-160 lb 14.4 oz (73 kg)] 160 lb 11.2 oz (72.9 kg) (07/24 0545)  PHYSICAL EXAMINATION: General:  Awake and alert, male OOB in chair, In NAD on RA Neuro:A&O x 3, MAE x 4, Non-focal HEENT: PERRLA, EOMI, No scleral icterus, No JVD, bruit, No  mass, No LAD  Cardiovascular: S1, S2, RRR, No MRG Lungs: Coarse throughout, bilateral chest excursion, No rales, + fine wheeze on expiration, decreased per L side. Abdomen: Soft, non-distended, non-tender,BS + Musculoskeletal: No obvious deformities notes, No limitation of ROM Skin:  Warm Dry and intact, no lesions or rash noted   Recent Labs Lab 07/05/17 1759 07/06/17 0357  NA 136 135  K 3.5 4.0  CL 96* 97*  CO2 31 30  BUN <5* 6  CREATININE 0.84 0.90  GLUCOSE 103* 156*    Recent Labs Lab 07/05/17 1759 07/06/17 0357  HGB 15.8 14.4  HCT 46.4 44.0  WBC 8.2 4.3  PLT 312 297   Dg Chest 2 View  Result Date: 07/05/2017 CLINICAL DATA:  Shortness of breath and wheezing. EXAM: CHEST  2 VIEW COMPARISON:  None. FINDINGS:  Marked enlargement of the mediastinum and the patient may even have a right aortic arch. However, the mediastinal tissue is more prominent on the left side. Trachea is midline. Heart size appears to be grossly normal. There is mild elevation of the left hemidiaphragm. Lungs are clear without airspace disease or pulmonary edema. No large pleural effusions. IMPRESSION: Enlargement and abnormal appearance of the mediastinum. Differential diagnosis would include a mediastinal/lung mass, lymphadenopathy and/or thoracic aortic aneurysm. Recommend further characterization with a chest CT with IV contrast. These results will be called to the ordering clinician or representative by the Radiologist Assistant, and communication documented in the PACS or zVision Dashboard. Electronically Signed   By: Markus Daft M.D.   On: 07/05/2017 13:20   Ct Chest W Contrast  Result Date: 07/05/2017 CLINICAL DATA:  61 year old male with shortness of breath for the past month and hoarseness for the past month and a half EXAM: CT CHEST WITH CONTRAST TECHNIQUE: Multidetector CT imaging of the chest was performed during intravenous contrast administration. 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 Diffuse narrowing of the left main pulmonary artery secondary to local mass effect from the mediastinal mass. No evidence of aortic aneurysm. The heart is normal in size. No pericardial effusion. Mediastinum/Nodes:  Ill-defined mediastinal mass which is difficult to measure precisely. The mass measures at least 6.5 x 5.2 x 6.6 cm and is contiguous with collapse of the left upper lobe. The mass completely obliterates the left upper lobe bronchus and likely impinges upon the left lower lobe bronchus. No additional mediastinal adenopathy identified. Lungs/Pleura: Complete collapse of the left  upper lobe. There is no aeration within the left upper lobe. Secondary hyperinflation of the left lower lobe. The aerated portion of the lungs are clear. No pulmonary mass or nodules. Upper Abdomen: Visualized upper abdominal organs are unremarkable. Musculoskeletal: No acute fracture or aggressive appearing lytic or blastic osseous lesion. IMPRESSION: 1. 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 There is also mass effect and significant narrowing of the left main pulmonary artery. Differential considerations include lymphoma, thymic malignancy, and potentially small cell lung cancer. No evidence of distant metastatic disease. 2. Complete collapse of the left upper lobe. It is difficult to identify where the primary mediastinal mass ends and atelectatic lung begins. Direct invasion into the lung is not excluded. 3. 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 Electronically Signed   By: Jacqulynn Cadet M.D.   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 for malignancy LUL collapse, Mass effect and narrowing of Left Main pulmonary artery No evidence of diatant metastatic disease on CT Weight loss of 5 pounds in 5 months Plan: Consider IR CT biopsy or EBUS for tissue sampling and staging of suspected malignancy Albuterol Nebs prn Continue prn cough medication, consider Hycodan if Robitussin not enough Monitor oxygen saturations, titrate oxygen for saturations> 93% IS and mobilize to optimize pulmonary function of healthy lung Follow LDH >> 213, smear pending PFT's once staged to determine if surgical candidate Oncology consult Based on tissue sampling and staging consider either TCTS consult/ oncology or  or palliative radiation therapy Will need PET scan as  outpatient Will need MRI brain  ? Role for additional decadron  DVT prophylaxis>> if pharmacological agent:  Lovenox preferred  30 minutes CC time  Magdalen Spatz, AGACNP-BC [place] Pager # [phone] or  Pager: [phone]  07/06/2017, 11:36 AM  STAFF NOTE: Linwood Dibbles, MD FACP have personally reviewed patient's available data, including medical history, events of note, physical examination and test results as part of my evaluation. I have discussed with resident/NP and other care providers such as pharmacist, RN and RRT. In addition, I personally evaluated patient and elicited key findings of: awake ,a lert, no distress in chair, hoarseness to voice, no mass in neck or lymphadenopathy, agophany anterior left and coarse, clear to right, abdo soft, no masses, no edema, pcxr and CT I reviewed - revealed large mass mediastinal LUL with collapse atx, the mass is concerning around some of the PA, the lesion at chest wall may be atx lung from collapse, would favor Bronch with BX as likely to see endobrochial lesion and will bx, if none seen would use ebus on second case, he is set up for bronch wed 11 am, all questions ansered and npo and consented verbal, etiology not clear, lymphoma, thyroid, lung?  Lavon Paganini. Titus Mould, MD, [place] Pgr: [facility] 07/06/2017 3:12 PM

## 2017-07-06 NOTE — Progress Notes (Signed)
pt called nurse to room complain that patient felt some lightheaded and dizziness , BP is 168/100, tab Norvasc was just given before patient complain that, since RN just gave him Norvasc will recheck BP after half an hour and go from there  [employer]

## 2017-07-06 NOTE — Progress Notes (Signed)
Pt's BP is still higher side but much stable than prior, MD has adjusted the dose of medicine, pt is taking contrast right now for CT abdomen, due for MRI and pt is scheduled for transbronchial biopsy tomorrow am, NPO since midnight, pt is aware about it, will continue to monitor

## 2017-07-07 ENCOUNTER — Inpatient Hospital Stay (HOSPITAL_COMMUNITY): Payer: Self-pay

## 2017-07-07 ENCOUNTER — Encounter (HOSPITAL_COMMUNITY)
Admission: EM | Disposition: A | Discharge status: Home / Self Care | Payer: Self-pay | Source: Home / Self Care | Attending: Internal Medicine

## 2017-07-07 DIAGNOSIS — I639 Cerebral infarction, unspecified: Secondary | ICD-10-CM

## 2017-07-07 HISTORY — PX: VIDEO BRONCHOSCOPY: SHX5072

## 2017-07-07 HISTORY — DX: Cerebral infarction, unspecified: I63.9

## 2017-07-07 SURGERY — VIDEO BRONCHOSCOPY WITHOUT FLUORO
Anesthesia: Moderate Sedation | Laterality: Bilateral

## 2017-07-07 MED ORDER — LIDOCAINE HCL (PF) 1 % IJ SOLN
INTRAMUSCULAR | Status: DC | PRN
  Administered 2017-07-07: 6 mL

## 2017-07-07 MED ORDER — MIDAZOLAM HCL 5 MG/ML IJ SOLN
INTRAMUSCULAR | Status: AC
  Filled 2017-07-07: qty 2

## 2017-07-07 MED ORDER — GADOBENATE DIMEGLUMINE 529 MG/ML IV SOLN
15.0000 mL | Freq: Once | INTRAVENOUS | Status: AC | PRN
  Administered 2017-07-07: 15 mL via INTRAVENOUS

## 2017-07-07 MED ORDER — FENTANYL CITRATE (PF) 100 MCG/2ML IJ SOLN
INTRAMUSCULAR | Status: DC | PRN
  Administered 2017-07-07 (×2): 50 ug via INTRAVENOUS
  Administered 2017-07-07 (×2): 25 ug via INTRAVENOUS
  Administered 2017-07-07: 50 ug via INTRAVENOUS

## 2017-07-07 MED ORDER — HYDRALAZINE HCL 20 MG/ML IJ SOLN
INTRAMUSCULAR | Status: AC
  Filled 2017-07-07: qty 1

## 2017-07-07 MED ORDER — PHENYLEPHRINE HCL 0.25 % NA SOLN
NASAL | Status: DC | PRN
  Administered 2017-07-07: 2 via NASAL

## 2017-07-07 MED ORDER — HYDRALAZINE HCL 20 MG/ML IJ SOLN
INTRAMUSCULAR | Status: DC | PRN
  Administered 2017-07-07: 10 mg via INTRAVENOUS

## 2017-07-07 MED ORDER — FENTANYL CITRATE (PF) 100 MCG/2ML IJ SOLN
INTRAMUSCULAR | Status: AC
  Filled 2017-07-07: qty 4

## 2017-07-07 MED ORDER — HYDRALAZINE HCL 20 MG/ML IJ SOLN
10.0000 mg | Freq: Once | INTRAMUSCULAR | Status: AC
  Administered 2017-07-07: 10 mg via INTRAVENOUS

## 2017-07-07 MED ORDER — LIDOCAINE HCL 2 % EX GEL
CUTANEOUS | Status: DC | PRN
  Administered 2017-07-07: 1

## 2017-07-07 MED ORDER — MIDAZOLAM HCL 10 MG/2ML IJ SOLN
INTRAMUSCULAR | Status: DC | PRN
  Administered 2017-07-07: 2 mg via INTRAVENOUS
  Administered 2017-07-07 (×3): 1 mg via INTRAVENOUS

## 2017-07-07 MED ORDER — EPINEPHRINE PF 1 MG/10ML IJ SOSY
PREFILLED_SYRINGE | INTRAMUSCULAR | Status: DC | PRN
Start: 2017-07-07 — End: 2017-07-07
  Administered 2017-07-07: 1 via INTRATRACHEAL

## 2017-07-07 NOTE — Op Note (Addendum)
[facility] Cardiopulmonary Patient Name: Levi Brown Pocedure Date: 07/07/2017 MRN: [id number] Attending MD: Collene Gobble , MD Date of Birth: Dec 30, 1955 CSN: Finalized Age: 61 Admit Type: Inpatient Gender: Male Procedure:            Bronchoscopy Indications:          Left upper lobe mass, Left hilar mass, Hemoptysis with                        abnormal CXR Providers:            Collene Gobble, MD, Ashley Mariner RRT,RCP, Phillis Knack                        RRT, RCP Referring MD:          Medicines:            Midazolam 5 mg IV, Fentanyl 200 mcg IV, Lidocaine 1%                        applied to cords 10 mL, Lidocaine 1% applied to the                        tracheobronchial tree 15 mL, Epinephrine 1 mg/10 mL                        topical 10 mL, Hydralazine 20mg  total in divided doses. Complications:        No immediate complications. Estimated blood loss:                        Minimal Estimated Blood Loss: Estimated blood loss was minimal. Procedure:            Pre-Anesthesia Assessment:                       - A History and Physical has been performed. Patient                        meds and allergies have been reviewed. The risks and                        benefits of the procedure and the sedation options and                        risks were discussed with the patient. All questions                        were answered and informed consent was obtained.                        Patient identification and proposed procedure were                        verified prior to the procedure by the physician in the                        procedure room. Mental Status Examination: alert and                        oriented. Airway Examination:  normal oropharyngeal                        airway. Respiratory Examination: poor air movement in                        the left lung. CV Examination: regular rate and rhythm,                        hypertensive to 220/120 before  medications given. ASA                        Grade Assessment: II - A patient with mild systemic                        disease. After reviewing the risks and benefits, the                        patient was deemed in satisfactory condition to undergo                        the procedure. The anesthesia plan was to use moderate                        sedation / analgesia (conscious sedation). Immediately                        prior to administration of medications, the patient was                        re-assessed for adequacy to receive sedatives. The                        heart rate, respiratory rate, oxygen saturations, blood                        pressure, adequacy of pulmonary ventilation, and                        response to care were monitored throughout the                        procedure. The physical status of the patient was                        re-assessed after the procedure.                       After obtaining informed consent, the bronchoscope was                        passed under direct vision. Throughout the procedure,                        the patient's blood pressure, pulse, and oxygen                        saturations were monitored continuously. the [id number]                        [id number]  scope was introduced through the right nostril                        and advanced to the tracheobronchial tree. The                        procedure was unusually difficult due to excessive                        bleeding. The patient tolerated the procedure fairly                        well. Scope In: 9:59:17 AM Scope Out: 10:23:31 AM Findings:      The nasopharynx/oropharynx appears normal. The larynx appears normal.       The vocal cords appear normal. The subglottic space is normal. The       trachea is of normal caliber but appears to be deviated towards the       right due to external compression. The carina is sharp. The       tracheobronchial tree of the  right lung was examined to at least the       first subsegmental level. Bronchial mucosa and anatomy in the right lung       are normal; there are no endobronchial lesions, and no secretions.      Left Lung Abnormalities: A partially obstructing (about 80% obstructed)       mass was found proximally, at the orifice in the left upper lobe. The       mass was large and bloody, circumferential, endobronchial, exophytic and       friable. The lesion was successfully traversed. A partially obstructing       (about 10% obstructed) mass was found proximally in the left lower lobe.       The mass was small and endobronchial and friable. The lesion was       successfully traversed. Endobronchial biopsies were performed in the       left upper lobe using forceps and sent for histopathology examination.       Two samples were obtained. Brushings were obtained in the left upper       lobe and sent for routine cytology. Three samples were obtained. Impression:           - Left upper lobe mass                       - Left hilar mass                       - Hemoptysis with abnormal CXR                       - The right lung was normal.                       - A bloody, circumferential, endobronchial, exophytic                        and friable mass was found in the left upper lobe. This                        lesion is likely malignant.                       -  An endobronchial and friable mass was found in the                        left lower lobe. This lesion is likely malignant.                       - An endobronchial biopsy was performed.                       - Brushings were obtained. Moderate Sedation:      Moderate (conscious) sedation was personally administered by the       endoscopist. The following parameters were monitored: oxygen saturation,       heart rate, blood pressure, respiratory rate, EKG, adequacy of pulmonary       ventilation, and response to care. Total physician intraservice  time was       48 minutes. Recommendation:       - Await biopsy, brushing and cytology results. Procedure Code(s):    --- Professional ---                       [phone], Bronchoscopy, rigid or flexible, including                        fluoroscopic guidance, when performed; with bronchial                        or endobronchial biopsy(s), single or multiple sites                       31623, Bronchoscopy, rigid or flexible, including                        fluoroscopic guidance, when performed; with brushing or                        protected brushings                       99152, Moderate sedation services provided by the same                        physician or other qualified health care professional                        performing the diagnostic or therapeutic service that                        the sedation supports, requiring the presence of an                        independent trained observer to assist in the                        monitoring of the patient's level of consciousness and                        physiological status; initial 15 minutes of                        intraservice time, patient age 58 years or older  02774, Moderate sedation services; each additional 15                        minutes intraservice time                       99153, Moderate sedation services; each additional 15                        minutes intraservice time Diagnosis Code(s):    --- Professional ---                       R91.8, Other nonspecific abnormal finding of lung field                       R04.2, Hemoptysis                       J98.9, Respiratory disorder, unspecified CPT copyright 2016 [employer]. All rights reserved. The codes documented in this report are preliminary and upon coder review may  be revised to meet current compliance requirements. Collene Gobble, MD Collene Gobble, MD 07/07/2017 11:02:46 AM Number of Addenda: 0

## 2017-07-07 NOTE — Progress Notes (Signed)
PROGRESS NOTE [facility]   TYHIR SCHWAN   OXB:[id number] DOB: 08/09/1956  DOA: 07/05/2017 PCP: Patient, No Pcp Per   Brief Narrative:  61 yo AA male without significant PMH. Former 1 pack/day 30 pack year smoker quite over 1 year ago, orginially seen at urgent care and sent to [facility] for abnormal CXR findings. Presented to the ED with SOB, foreign body sensation in throat, and coughing up blood. States that he frequently spits up blood especially after waking up. Symptoms have been persistent for about 6 weeks and getting worse. A trail of amoxacillin was given with no improvement of symptoms. Breathing tx at urgent care help the SOB. Admits to increasing bilateral peripheral edema that started several weeks ago. Denied chest pain, abdominal pain, nausea, vomiting, focal weakness and paresthesias in the ED.   Subjective: He is seen after returned from bronchoscopy, he is still a little drowsy, but oriented x3, he denies pain, he Admits cough and hoarseness.  Though no cough noticed during encounter, his voice is ok He does not have fever, he is on room air    Assessment & Plan:   Principal Problem:   Mediastinal mass Active Problems:   Elevated blood pressure reading  Shortness of breath complicated by a Mediastinal Mass (former smoker)  -s/p bronchoscopy with bronchial brushing and biopsy on 7/25, pathology pending, appreciate pulmonary input  - mri brain ordered  -oncology input appreciated, may need initiate inpatient chemo pending pathology result, will follow oncology recommendations   Essential hypertension   - continue norvasc  DVT prophylaxis: scd Code Status: full Family Communication: no Disposition Plan:  pending pulmonary/oncology recommendations    Consultants:   Pulmonary  oncology  Procedures:  s/p bronchoscopy with bronchial brushing and biopsy on 7/25  Antimicrobials:  none   Objective: Vitals:   07/07/17 1055 07/07/17 1100 07/07/17 1126  07/07/17 1648  BP: (!) 166/101 (!) 168/102 (!) 149/94 (!) 144/97  Pulse: (!) 109 (!) 109 (!) 102 89  Resp: 16 13    Temp:    99 F (37.2 C)  TempSrc:    Oral  SpO2:    100%  Weight:      Height:        Intake/Output Summary (Last 24 hours) at 07/07/17 1736 Last data filed at 07/07/17 1533  Gross per 24 hour  Intake          2193.75 ml  Output             3050 ml  Net          -856.25 ml   Filed Weights   07/06/17 0545 07/07/17 0549 07/07/17 0832  Weight: 72.9 kg (160 lb 11.2 oz) 72.7 kg (160 lb 3.2 oz) 72.6 kg (160 lb)    Examination:  General exam: Appears calm and comfortable  HEENT: NCAT, PERRL, OP moist and clear Respiratory system: Clear to auscultation. No wheezes,crackle or rhonchi Cardiovascular system: S1 & S2 heard, RRR. No JVD, murmurs, rubs or gallops Gastrointestinal system: Abdomen is nondistended, soft and nontender. Normal bowel sounds heard. Central nervous system: Alert and oriented. No focal neurological deficits. Extremities: No pedal edema.  Skin: No rashes, lesions or ulcers Psychiatry: Judgement and insight appear normal. Mood & affect appropriate.    Data Reviewed: I have personally reviewed following labs and imaging studies  CBC:  Recent Labs Lab 07/05/17 1759 07/06/17 0357  WBC 8.2 4.3  NEUTROABS 5.5  --   HGB 15.8 14.4  HCT 46.4 44.0  MCV 96.5 97.6  PLT 312 062   Basic Metabolic Panel:  Recent Labs Lab 07/05/17 1759 07/06/17 0357 07/06/17 1635  NA 136 135  --   K 3.5 4.0  --   CL 96* 97*  --   CO2 31 30  --   GLUCOSE 103* 156*  --   BUN <5* 6  --   CREATININE 0.84 0.90  --   CALCIUM 9.5 9.2  --   PHOS  --   --  3.0   GFR: Estimated Creatinine Clearance: 79.8 mL/min (by C-G formula based on SCr of 0.9 mg/dL). Liver Function Tests:  Recent Labs Lab 07/05/17 1759  AST 21  ALT 15*  ALKPHOS 86  BILITOT 1.0  PROT 7.5  ALBUMIN 4.1   No results for input(s): LIPASE, AMYLASE in the last 168 hours. No results for  input(s): AMMONIA in the last 168 hours. Coagulation Profile:  Recent Labs Lab 07/05/17 2346  INR 1.02   Cardiac Enzymes: No results for input(s): CKTOTAL, CKMB, CKMBINDEX, TROPONINI in the last 168 hours. BNP (last 3 results) No results for input(s): PROBNP in the last 8760 hours. HbA1C: No results for input(s): HGBA1C in the last 72 hours. CBG: No results for input(s): GLUCAP in the last 168 hours. Lipid Profile: No results for input(s): CHOL, HDL, LDLCALC, TRIG, CHOLHDL, LDLDIRECT in the last 72 hours. Thyroid Function Tests: No results for input(s): TSH, T4TOTAL, FREET4, T3FREE, THYROIDAB in the last 72 hours. Anemia Panel: No results for input(s): VITAMINB12, FOLATE, FERRITIN, TIBC, IRON, RETICCTPCT in the last 72 hours. Sepsis Labs: No results for input(s): PROCALCITON, LATICACIDVEN in the last 168 hours.  No results found for this or any previous visit (from the past 240 hour(s)).       Radiology Studies: Ct Chest W Contrast  Result Date: 07/05/2017 CLINICAL DATA:  61 year old male with shortness of breath for the past month and hoarseness for the past month and a half EXAM: CT CHEST WITH CONTRAST TECHNIQUE: Multidetector CT imaging of the chest was performed during intravenous contrast administration. CONTRAST:  124mL ISOVUE-300 IOPAMIDOL (ISOVUE-300) INJECTION 61% COMPARISON:  Chest x-ray obtained earlier today FINDINGS: Cardiovascular: Right-sided aortic arch with aberrant left subclavian artery. Diffuse narrowing of the left main pulmonary artery secondary to local mass effect from the mediastinal mass. No evidence of aortic aneurysm. The heart is normal in size. No pericardial effusion. Mediastinum/Nodes: Ill-defined mediastinal mass which is difficult to measure precisely. The mass measures at least 6.5 x 5.2 x 6.6 cm and is contiguous with collapse of the left upper lobe. The mass completely obliterates the left upper lobe bronchus and likely impinges upon the left  lower lobe bronchus. No additional mediastinal adenopathy identified. Lungs/Pleura: Complete collapse of the left upper lobe. There is no aeration within the left upper lobe. Secondary hyperinflation of the left lower lobe. The aerated portion of the lungs are clear. No pulmonary mass or nodules. Upper Abdomen: Visualized upper abdominal organs are unremarkable. Musculoskeletal: No acute fracture or aggressive appearing lytic or blastic osseous lesion. IMPRESSION: 1. 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 There is also mass effect and significant narrowing of the left main pulmonary artery. Differential considerations include lymphoma, thymic malignancy, and potentially small cell lung cancer. No evidence of distant metastatic disease. 2. Complete collapse of the left upper lobe. It is difficult to identify  where the primary mediastinal mass ends and atelectatic lung begins. Direct invasion into the lung is not excluded. 3. 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 Electronically Signed   By: Jacqulynn Cadet M.D.   On: 07/05/2017 20:58   Ct Abdomen Pelvis W Contrast  Result Date: 07/06/2017 CLINICAL DATA:  Mediastinal mass EXAM: CT ABDOMEN AND PELVIS WITH CONTRAST TECHNIQUE: Multidetector CT imaging of the abdomen and pelvis was performed using the standard protocol following bolus administration of intravenous contrast. CONTRAST:  100 mL Isovue 300 intravenous COMPARISON:  CT chest 07/05/2017 FINDINGS: Lower chest: Lung bases demonstrate no acute consolidation or pleural effusion. Heart size upper normal. Patient has history of right-sided aortic arch, and the descending thoracic aorta is seen to the right of the spine. Hepatobiliary: No focal liver abnormality is seen. No gallstones, gallbladder wall thickening, or biliary dilatation.  Pancreas: Unremarkable. No pancreatic ductal dilatation or surrounding inflammatory changes. Spleen: Normal in size without focal abnormality. Adrenals/Urinary Tract: Adrenal glands are unremarkable. Kidneys are normal, without renal calculi, focal lesion, or hydronephrosis. Bladder is unremarkable. Stomach/Bowel: Stomach is within normal limits. Appendix appears normal. No evidence of bowel wall thickening, distention, or inflammatory changes. Vascular/Lymphatic: Aortic atherosclerosis. No enlarged abdominal or pelvic lymph nodes. Reproductive: Prominent peripheral calcification in the prostate. Other: No free air or free fluid. Musculoskeletal: No acute or suspicious bone lesion. Degenerative changes. IMPRESSION: 1. No CT evidence for metastatic disease to the abdomen or pelvis. 2. 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 Electronically Signed   By: Donavan Foil M.D.   On: 07/06/2017 22:17      Scheduled Meds: . amLODipine  5 mg Oral Daily  . levalbuterol  1.25 mg Nebulization TID  . metoprolol tartrate  12.5 mg Oral BID   Continuous Infusions: . sodium chloride 75 mL/hr at 07/06/17 1109     LOS: 2 days    Time spent: 34mins    Yulian Gosney, MD PhD Pager: Text Page via [email or web address]  [phone]  If 7PM-7AM, please contact night-coverage [email or web address] Password [email or web address] 07/07/2017, 5:36 PM

## 2017-07-07 NOTE — Progress Notes (Signed)
Patient for bronchoscopy.  Upon arrival, BP > 157 systolic.  After several repeated BP, systolic ranged from 262-035.  MD called, orders received to give 10 mg hydralazine.  RN came in to administer the drug.  BP post drug 197/111.  Will continue to monitor.

## 2017-07-07 NOTE — Progress Notes (Signed)
[facility] Follow-up Visit:  Assessment: Mediastinal mass 61 year old male with new presentation with coarse voice, dyspnea on exertion, cough, and hemoptysis in the setting of a large infiltrative mass lesion in the left mediastinum, hilum, and pulmonary involvement. Current contralateral pulmonary disease, additional lymphadenopathy, or evidence of metastasis in the visualized skeletal structures and liver. Most likely, we're dealing with a malignancy, completion staging as needed and tissue diagnosis is of paramount significance. She'll include small cell lung cancer versus lymphoma. Other malignancies are possible, but I'm only slightly lower and my differential based on location in the mediastinum. 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  LDH is minimally elevated which can be present in any number of aggressive tumors and by itself does help much to differentiate the diagnosis. CT of the abdomen and pelvis was obtained last night and showed no evidence of metastatic disease in the liver, retroperitoneum, or adrenal glands. No abnormalities skeletal structures.  Recommendations: --No significant changes in my recommendations from yesterday. --Proceed with bronchoscopic evaluation and biopsies to obtain tissue diagnosis --Recommend MRI of the brain  --If patient is found to have a lymphoma or small cell lung cancer, we may consider initiation of the cancer-directed therapy prior to discharge from the hospital due to significant symptoms experienced by the patient.  Thank you for involving [place] in the care of this patient, our service will continue follow-up and we'll update our recommendations as more information becomes available.   Orders Placed This Encounter  Procedures   . CT Chest W Contrast    Standing Status:   Standing    Number of Occurrences:   1    Order Specific Question:   Reason for Exam (SYMPTOM  OR DIAGNOSIS REQUIRED)    Answer:   abnormal    Order Specific Question:   Does the patient have a contrast media/X-ray dye allergy?    Answer:   No    Order Specific Question:   If indicated for the ordered procedure, I authorize the administration of contrast media per Radiology protocol    Answer:   Yes    Order Specific Question:   Radiology Contrast Protocol - do NOT remove file path    Answer:   \\[email or web address].[email or web address]  . MR BRAIN W WO CONTRAST    Standing Status:   Standing    Number of Occurrences:   1    Order Specific Question:   If indicated for the ordered procedure, I authorize the administration of contrast media per Radiology protocol    Answer:   Yes    Order Specific Question:   Symptom/Reason for Exam    Answer:   Mediastinal mass [016010]    Order Specific Question:   What is the patient's sedation requirement?    Answer:   No Sedation    Order Specific Question:   Does the patient have a pacemaker or implanted devices?    Answer:   No    Order Specific Question:   Radiology Contrast Protocol - do NOT remove file path    Answer:   \\[email or web address]  . CT ABDOMEN PELVIS W CONTRAST    Standing Status:   Standing    Number of Occurrences:   1    Order Specific Question:   Does the patient have a contrast media/X-ray dye  allergy?    Answer:   No    Order Specific Question:   If indicated for the ordered procedure, I authorize the administration of contrast media per Radiology protocol    Answer:   Yes    Order Specific Question:   Symptom/Reason for Exam    Answer:   Mediastinal mass [277824]    Order Specific Question:   Radiology Contrast Protocol - do NOT remove file path    Answer:   \\[email or web address].pdf  . CBC with Differential/Platelet    Standing  Status:   Standing    Number of Occurrences:   1  . Comprehensive metabolic panel    Standing Status:   Standing    Number of Occurrences:   1  . Lactate dehydrogenase    Standing Status:   Standing    Number of Occurrences:   1  . Pathologist smear review    Standing Status:   Standing    Number of Occurrences:   1  . Save smear    Standing Status:   Standing    Number of Occurrences:   1  . Protime-INR    Standing Status:   Standing    Number of Occurrences:   1  . APTT    Standing Status:   Standing    Number of Occurrences:   1  . HIV antibody (Routine Testing)    Standing Status:   Standing    Number of Occurrences:   1  . Basic metabolic panel    Standing Status:   Standing    Number of Occurrences:   1  . CBC    Standing Status:   Standing    Number of Occurrences:   1  . Uric acid    Standing Status:   Standing    Number of Occurrences:   1  . Phosphorus    Standing Status:   Standing    Number of Occurrences:   1  . Diet NPO time specified    Standing Status:   Standing    Number of Occurrences:   1  . Nursing communication    Labs are showing as collected but not in process. Can you please call lab and make sure they have the samples and running them? Thank you    Standing Status:   Standing    Number of Occurrences:   1  . Expected discharge Date: 07/07/2017; Planned Disposition: Home    Standing Status:   Standing    Number of Occurrences:   1    Order Specific Question:   Date    Answer:   07/07/2017    Order Specific Question:   Planned Disposition    Answer:   Home  . Vital signs    Standing Status:   Standing    Number of Occurrences:   1  . Notify physician    Standing Status:   Standing    Number of Occurrences:   20    Order Specific Question:   Notify Physician    Answer:   for pulse less than 55 or greater than 120    Order Specific Question:   Notify Physician    Answer:   for respiratory rate less than 12 or greater than 25    Order  Specific Question:   Notify Physician    Answer:   for temperature greater than 100.5 F    Order Specific Question:   Notify Physician    Answer:  for urinary output less than 30 mL/kg/hr for four hours    Order Specific Question:   Notify Physician    Answer:   for systolic BP less than 90 or greater than 400, diastolic BP less than 60 or greater than 100  . RN may order General Admission PRN Orders utilizing "General Admission PRN medications" (through manage orders) for the following patient needs: allergy symptoms (Claritin), cold sores (Carmex), cough (Robitussin DM), eye irritation (Liquifilm Tears), hemorrhoids (Tucks), indigestion (Maalox), minor skin irritation (Hydrocortisone Cream), muscle pain Suezanne Jacquet Gay), nose irritation (saline nasal spray) and sore throat (Chloraseptic spray).    Standing Status:   Standing    Number of Occurrences:   [id number]  . SCDs    Standing Status:   Standing    Number of Occurrences:   1    Order Specific Question:   Laterality    Answer:   Bilateral  . Cardiac Monitoring Continuous x 48 hours Indications for use: Syncope of unknown etiology, Other; Other indications for use: Tachycardia    Standing Status:   Standing    Number of Occurrences:   1    Order Specific Question:   Indications for use:    Answer:   Syncope of unknown etiology    Order Specific Question:   Indications for use:    Answer:   Other    Order Specific Question:   Other indications for use:    Answer:   Tachycardia  . Up as tolerated    Standing Status:   Standing    Number of Occurrences:   1  . Full code    Standing Status:   Standing    Number of Occurrences:   1  . Pulse oximetry check with vital signs    Standing Status:   Standing    Number of Occurrences:   1  . EKG 12-Lead    Once on exam    Standing Status:   Standing    Number of Occurrences:   1    Order Specific Question:   Reason for Exam    Answer:   sob  . EKG 12-Lead    Standing Status:   Standing     Number of Occurrences:   1  . Insert peripheral IV    Standing Status:   Standing    Number of Occurrences:   1  . Admit to Inpatient (patient's expected length of stay will be greater than 2 midnights or inpatient only procedure)    Standing Status:   Standing    Number of Occurrences:   1    Order Specific Question:   Hospital Area    Answer:   [facility] [[id number]]    Order Specific Question:   Level of Care    Answer:   Telemetry [5]    Order Specific Question:   Diagnosis    Answer:   Mediastinal mass [[id number]]    Order Specific Question:   Admitting Physician    Answer:   Ivor Costa [[id number]]    Order Specific Question:   Attending Physician    Answer:   Ivor Costa [[id number]]    Order Specific Question:   Estimated length of stay    Answer:   past midnight tomorrow    Order Specific Question:   Certification:    Answer:   I certify this patient will need inpatient services for at least 2 midnights    Order Specific Question:   PT  Class (Do Not Modify)    Answer:   Inpatient [101]    Order Specific Question:   PT Acc Code (Do Not Modify)    Answer:   Private [1]    All questions were answered.    This note was electronically signed.    History of Presenting Illness Levi Brown 61 y.o. presently admitted for evaluation of a large left-sided mediastinal and pulmonary hilum mass. Patient denies any new complaints since the last visit. Changes in the respiratory status. No new areas of pain. No new neurological deficits.  Medical History: Past Medical History:  Diagnosis Date  . Former smoker    Quit smoking in early 2017    Surgical History: History reviewed. No pertinent surgical history.  Family History: Family History  Problem Relation Age of Onset  . Asthma Mother   . COPD Mother   . Cancer Neg Hx     Social History: Social History   Social History  . Marital status: Single    Spouse name: N/A  . Number of children: N/A  . Years of  education: N/A   Occupational History  . Not on file.   Social History Main Topics  . Smoking status: Former Smoker    Packs/day: 1.00    Years: 30.00    Types: Cigarettes    Quit date: 2017  . Smokeless tobacco: Never Used     Comment: quit smoking in 2017  . Alcohol use 8.4 - 12.6 oz/week    14 - 21 Cans of beer per week  . Drug use: No  . Sexual activity: Not on file   Other Topics Concern  . Not on file   Social History Narrative  . No narrative on file    Allergies: No Known Allergies  Medications:  Current Facility-Administered Medications  Medication Dose Route Frequency Provider Last Rate Last Dose  . 0.9 %  sodium chloride infusion   Intravenous Continuous Ivor Costa, MD 75 mL/hr at 07/06/17 1109    . [MAR Hold] acetaminophen (TYLENOL) tablet 650 mg  650 mg Oral Q6H PRN Ivor Costa, MD       Or  . Doug Sou Hold] acetaminophen (TYLENOL) suppository 650 mg  650 mg Rectal Q6H PRN Ivor Costa, MD      . Doug Sou Hold] albuterol (PROVENTIL) (2.5 MG/3ML) 0.083% nebulizer solution 2.5 mg  2.5 mg Nebulization Q4H PRN Rosita Fire, MD      . Doug Sou Hold] amLODipine (NORVASC) tablet 5 mg  5 mg Oral Daily Rosita Fire, MD   5 mg at 07/06/17 1434  . [MAR Hold] guaifenesin (ROBITUSSIN) 100 MG/5ML syrup 200 mg  200 mg Oral Q4H PRN Ivor Costa, MD      . Doug Sou Hold] hydrALAZINE (APRESOLINE) injection 5 mg  5 mg Intravenous Q2H PRN Ivor Costa, MD      . Doug Sou Hold] levalbuterol Penne Lash) nebulizer solution 1.25 mg  1.25 mg Nebulization TID Rosita Fire, MD      . Doug Sou Hold] metoprolol tartrate (LOPRESSOR) tablet 12.5 mg  12.5 mg Oral BID Rosita Fire, MD   12.5 mg at 07/06/17 2002  . [MAR Hold] ondansetron (ZOFRAN) injection 4 mg  4 mg Intravenous Q8H PRN Ivor Costa, MD      . Doug Sou Hold] zolpidem (AMBIEN) tablet 5 mg  5 mg Oral QHS PRN Ivor Costa, MD        Review of Systems: Review of Systems  All other systems reviewed and are negative.  PHYSICAL  EXAMINATION Blood pressure (!) 163/116, pulse 99, temperature 98.1 F (36.7 C), temperature source Oral, resp. rate 17, height 5\' 6"  (1.676 m), weight 160 lb 3.2 oz (72.7 kg), SpO2 96 %.  ECOG PERFORMANCE STATUS: 1 - Symptomatic but completely ambulatory  Physical Exam  Constitutional: He is oriented to person, place, and time and well-developed, well-nourished, and in no distress. No distress.  HENT:  Head: Normocephalic and atraumatic.  Mouth/Throat: Oropharynx is clear and moist. No oropharyngeal exudate.  Eyes: Pupils are equal, round, and reactive to light. EOM are normal. No scleral icterus.  Cardiovascular: Normal rate, regular rhythm and intact distal pulses.   No murmur heard. Pulmonary/Chest: Effort normal.  section sounds of the left side with intermittent wheezing  Abdominal: Soft. Bowel sounds are normal. He exhibits no mass. There is no tenderness. There is no guarding.  Musculoskeletal: Normal range of motion. He exhibits no edema, tenderness or deformity.  Lymphadenopathy:    He has no cervical adenopathy.  Neurological: He is alert and oriented to person, place, and time. He displays normal reflexes. No cranial nerve deficit.  Skin: He is not diaphoretic.     LABORATORY DATA: I have personally reviewed the data as listed: Admission on 07/05/2017  Component Date Value Ref Range Status  . WBC 07/05/2017 8.2  4.0 - 10.5 K/uL Final  . RBC 07/05/2017 4.81  4.22 - 5.81 MIL/uL Final  . Hemoglobin 07/05/2017 15.8  13.0 - 17.0 g/dL Final  . HCT 07/07/2017 46.4  39.0 - 52.0 % Final  . MCV 07/05/2017 96.5  78.0 - 100.0 fL Final  . MCH 07/05/2017 32.8  26.0 - 34.0 pg Final  . MCHC 07/05/2017 34.1  30.0 - 36.0 g/dL Final  . RDW 07/07/2017 13.7  11.5 - 15.5 % Final  . Platelets 07/05/2017 312  150 - 400 K/uL Final  . Neutrophils Relative % 07/05/2017 67  % Final  . Neutro Abs 07/05/2017 5.5  1.7 - 7.7 K/uL Final  . Lymphocytes Relative 07/05/2017 21  % Final  . Lymphs Abs  07/05/2017 1.7  0.7 - 4.0 K/uL Final  . Monocytes Relative 07/05/2017 9  % Final  . Monocytes Absolute 07/05/2017 0.7  0.1 - 1.0 K/uL Final  . Eosinophils Relative 07/05/2017 2  % Final  . Eosinophils Absolute 07/05/2017 0.2  0.0 - 0.7 K/uL Final  . Basophils Relative 07/05/2017 1  % Final  . Basophils Absolute 07/05/2017 0.0  0.0 - 0.1 K/uL Final  . Sodium 07/05/2017 136  135 - 145 mmol/L Final  . Potassium 07/05/2017 3.5  3.5 - 5.1 mmol/L Final  . Chloride 07/05/2017 96* 101 - 111 mmol/L Final  . CO2 07/05/2017 31  22 - 32 mmol/L Final  . Glucose, Bld 07/05/2017 103* 65 - 99 mg/dL Final  . BUN 07/07/2017 <5* 6 - 20 mg/dL Final  . Creatinine, Ser 07/05/2017 0.84  0.61 - 1.24 mg/dL Final  . Calcium 07/07/2017 9.5  8.9 - 10.3 mg/dL Final  . Total Protein 07/05/2017 7.5  6.5 - 8.1 g/dL Final  . Albumin 07/07/2017 4.1  3.5 - 5.0 g/dL Final  . AST 04/67/9385 21  15 - 41 U/L Final  . ALT 07/05/2017 15* 17 - 63 U/L Final  . Alkaline Phosphatase 07/05/2017 86  38 - 126 U/L Final  . Total Bilirubin 07/05/2017 1.0  0.3 - 1.2 mg/dL Final  . GFR calc non Af Amer 07/05/2017 >60  >60 mL/min Final  . GFR calc Af 07/07/2017  07/05/2017 >60  >60 mL/min Final   Comment: (NOTE) The eGFR has been calculated using the CKD EPI equation. This calculation has not been validated in all clinical situations. eGFR's persistently <60 mL/min signify possible Chronic Kidney Disease.   . Anion gap 07/05/2017 9  5 - 15 Final  . LDH 07/05/2017 213* 98 - 192 U/L Final   SLIGHT HEMOLYSIS  . Smear Review 07/05/2017 SMEAR STAINED AND AVAILABLE FOR REVIEW   Final  . Prothrombin Time 07/05/2017 13.4  11.4 - 15.2 seconds Final  . INR 07/05/2017 1.02   Final  . aPTT 07/05/2017 35  24 - 36 seconds Final  . HIV Screen 4th Generation wRfx 07/05/2017 Non Reactive  Non Reactive Final   Comment: (NOTE) Performed At: [facility] [place], [place] [id number] Lindon Romp MD [id number]   . Sodium  07/06/2017 135  135 - 145 mmol/L Final  . Potassium 07/06/2017 4.0  3.5 - 5.1 mmol/L Final  . Chloride 07/06/2017 97* 101 - 111 mmol/L Final  . CO2 07/06/2017 30  22 - 32 mmol/L Final  . Glucose, Bld 07/06/2017 156* 65 - 99 mg/dL Final  . BUN 07/06/2017 6  6 - 20 mg/dL Final  . Creatinine, Ser 07/06/2017 0.90  0.61 - 1.24 mg/dL Final  . Calcium 07/06/2017 9.2  8.9 - 10.3 mg/dL Final  . GFR calc non Af Amer 07/06/2017 >60  >60 mL/min Final  . GFR calc Af Amer 07/06/2017 >60  >60 mL/min Final   Comment: (NOTE) The eGFR has been calculated using the CKD EPI equation. This calculation has not been validated in all clinical situations. eGFR's persistently <60 mL/min signify possible Chronic Kidney Disease.   . Anion gap 07/06/2017 8  5 - 15 Final  . WBC 07/06/2017 4.3  4.0 - 10.5 K/uL Final  . RBC 07/06/2017 4.51  4.22 - 5.81 MIL/uL Final  . Hemoglobin 07/06/2017 14.4  13.0 - 17.0 g/dL Final  . HCT 07/06/2017 44.0  39.0 - 52.0 % Final  . MCV 07/06/2017 97.6  78.0 - 100.0 fL Final  . MCH 07/06/2017 31.9  26.0 - 34.0 pg Final  . MCHC 07/06/2017 32.7  30.0 - 36.0 g/dL Final  . RDW 07/06/2017 14.1  11.5 - 15.5 % Final  . Platelets 07/06/2017 297  150 - 400 K/uL Final  . Uric Acid, Serum 07/06/2017 6.1  4.4 - 7.6 mg/dL Final  . Phosphorus 07/06/2017 3.0  2.5 - 4.6 mg/dL Final       Ardath Sax, MD

## 2017-07-07 NOTE — H&P (View-Only) (Signed)
Name: Levi Brown MRN: [id number] DOB: January 31, 1956    ADMISSION DATE:  07/05/2017 CONSULTATION DATE:  07/06/2017  REFERRING MD :  Ivor Costa  CHIEF COMPLAINT: Shortness of breath, cough, hemoptysis and hoarseness   BRIEF PATIENT DESCRIPTION:  AA Male  sitting in chair in NAD on RA. Very hoarse voice.  SIGNIFICANT EVENTS  07/05/2017>> Admit  STUDIES:  07/05/2017>>CT Chest with contrast : Large, amorphous approximately 6.6 x 6.5 x 5.2 cm mass centered in the left mediastinum which completely obliterates the left upper lobe bronchus and impinges into the left lower lobe bronchus. There is also mass effect and significant narrowing of the left main pulmonary artery. Differential considerations include lymphoma, thymic malignancy, and potentially small cell lung cancer. No evidence of distant metastatic disease. 2. Complete collapse of the left upper lobe. It is difficult to identify where the primary mediastinal mass ends and atelectatic lung begins. Direct invasion into the lung is not excluded.  3. 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 male without significant past medical history except for former smoker, who presents to the ED 07/05/2017 with shortness of breath, cough, hemoptysis or hoarseness.   Patient states that he has been having dry cough, SOB, hoarse voice for 6 weeks, which has been progressively getting worse. Patient does not have chest pain. No fever or chills. Occasionally he has mild hemoptysis with blood-streaked sputum. No night sweats. He was treated with Amoxicillin recently with no improvement in his symptoms. No calf tenderness. He denies PND or orthopnea. Patient does not have nausea, vomiting, diarrhea, abdominal pain, symptoms of UTI or unilateral weakness.  Pt was seen in urgent care and found to have significantly abnormal chest x-ray concerning for thoracic aortic aneurysm versus  cancer, therefore sent to ED for further evaluation and treatment. Patient states that he lost 5 pounds in the past 5 months. Patient states that he does not have a primary care physician. He smoked 30-pack-years and quit 1.5 years ago. He works in the Pharmacist, community , [employer].  ED Course: pt was found to have WBC 22, electrolytes renal function okay, temperature 98.6, tachycardia, oxygen saturation 93% on room air. Patient is admitted to telemetry bed as inpatient for further work up.  CCM were consulted to assist with management of mediastinal mass  PAST MEDICAL HISTORY :   has a past medical history of Former smoker.  has no past surgical history on file. Prior to Admission medications   Medication Sig Start Date End Date Taking? Authorizing Provider  acetaminophen (TYLENOL) 325 MG tablet Take 325 mg by mouth every 6 (six) hours as needed for moderate pain.   Yes [provider]  Dextromethorphan Polistirex (COUGH DM PO) Take 30 mLs by mouth daily as needed (cough).   Yes [provider]  Multiple Vitamins-Minerals (DRY EYE FORMULA PO) Place 1 drop into both eyes daily.   Yes [provider]   No Known Allergies  FAMILY HISTORY:  family history includes Asthma in his mother; COPD in his mother. SOCIAL HISTORY:  reports that he has quit smoking. He has never used smokeless tobacco. He reports that he drinks alcohol. He reports that he does not use drugs.  REVIEW OF SYSTEMS:   Constitutional: Negative for fever, chills, + weight loss, malaise/fatigue and diaphoresis.  HENT: Negative for hearing loss, ear pain, nosebleeds, congestion, sore throat, neck pain, tinnitus and ear discharge.  Eyes: Negative for blurred vision, double vision, photophobia, pain, discharge and redness.  Respiratory: + for cough, hemoptysis, sputum production, shortness of breath, no wheezing and stridor.   Cardiovascular: Negative for chest pain, palpitations, orthopnea,  claudication, leg swelling and PND.  Gastrointestinal: Negative for heartburn, nausea, vomiting, abdominal pain, diarrhea, constipation, blood in stool and melena.+ decrease in appetite  Genitourinary: Negative for dysuria, urgency, frequency, hematuria and flank pain.  Musculoskeletal: Negative for myalgias, back pain, joint pain and falls.  Skin: Negative for itching and rash.  Neurological: Negative for dizziness, tingling, tremors, sensory change, speech change, focal weakness, seizures, loss of consciousness, weakness and headaches.  Endo/Heme/Allergies: Negative for environmental allergies and polydipsia. Does not bruise/bleed easily.  SUBJECTIVE:   VITAL SIGNS: Temp:  [98.3 F (36.8 C)-98.9 F (37.2 C)] 98.3 F (36.8 C) (07/24 0755) Pulse Rate:  [94-116] 96 (07/24 0755) Resp:  [15-20] 16 (07/24 0755) BP: (104-186)/(75-141) 166/117 (07/24 0755) SpO2:  [92 %-100 %] 94 % (07/24 0812) Weight:  [160 lb 11.2 oz (72.9 kg)-160 lb 14.4 oz (73 kg)] 160 lb 11.2 oz (72.9 kg) (07/24 0545)  PHYSICAL EXAMINATION: General:  Awake and alert, male OOB in chair, In NAD on RA Neuro:A&O x 3, MAE x 4, Non-focal HEENT: PERRLA, EOMI, No scleral icterus, No JVD, bruit, No  mass, No LAD  Cardiovascular: S1, S2, RRR, No MRG Lungs: Coarse throughout, bilateral chest excursion, No rales, + fine wheeze on expiration, decreased per L side. Abdomen: Soft, non-distended, non-tender,BS + Musculoskeletal: No obvious deformities notes, No limitation of ROM Skin:  Warm Dry and intact, no lesions or rash noted   Recent Labs Lab 07/05/17 1759 07/06/17 0357  NA 136 135  K 3.5 4.0  CL 96* 97*  CO2 31 30  BUN <5* 6  CREATININE 0.84 0.90  GLUCOSE 103* 156*    Recent Labs Lab 07/05/17 1759 07/06/17 0357  HGB 15.8 14.4  HCT 46.4 44.0  WBC 8.2 4.3  PLT 312 297   Dg Chest 2 View  Result Date: 07/05/2017 CLINICAL DATA:  Shortness of breath and wheezing. EXAM: CHEST  2 VIEW COMPARISON:  None. FINDINGS:  Marked enlargement of the mediastinum and the patient may even have a right aortic arch. However, the mediastinal tissue is more prominent on the left side. Trachea is midline. Heart size appears to be grossly normal. There is mild elevation of the left hemidiaphragm. Lungs are clear without airspace disease or pulmonary edema. No large pleural effusions. IMPRESSION: Enlargement and abnormal appearance of the mediastinum. Differential diagnosis would include a mediastinal/lung mass, lymphadenopathy and/or thoracic aortic aneurysm. Recommend further characterization with a chest CT with IV contrast. These results will be called to the ordering clinician or representative by the Radiologist Assistant, and communication documented in the PACS or zVision Dashboard. Electronically Signed   By: Markus Daft M.D.   On: 07/05/2017 13:20   Ct Chest W Contrast  Result Date: 07/05/2017 CLINICAL DATA:  61 year old male with shortness of breath for the past month and hoarseness for the past month and a half EXAM: CT CHEST WITH CONTRAST TECHNIQUE: Multidetector CT imaging of the chest was performed during intravenous contrast administration. 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 Diffuse narrowing of the left main pulmonary artery secondary to local mass effect from the mediastinal mass. No evidence of aortic aneurysm. The heart is normal in size. No pericardial effusion. Mediastinum/Nodes:  Ill-defined mediastinal mass which is difficult to measure precisely. The mass measures at least 6.5 x 5.2 x 6.6 cm and is contiguous with collapse of the left upper lobe. The mass completely obliterates the left upper lobe bronchus and likely impinges upon the left lower lobe bronchus. No additional mediastinal adenopathy identified. Lungs/Pleura: Complete collapse of the left  upper lobe. There is no aeration within the left upper lobe. Secondary hyperinflation of the left lower lobe. The aerated portion of the lungs are clear. No pulmonary mass or nodules. Upper Abdomen: Visualized upper abdominal organs are unremarkable. Musculoskeletal: No acute fracture or aggressive appearing lytic or blastic osseous lesion. IMPRESSION: 1. 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 There is also mass effect and significant narrowing of the left main pulmonary artery. Differential considerations include lymphoma, thymic malignancy, and potentially small cell lung cancer. No evidence of distant metastatic disease. 2. Complete collapse of the left upper lobe. It is difficult to identify where the primary mediastinal mass ends and atelectatic lung begins. Direct invasion into the lung is not excluded. 3. 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 Electronically Signed   By: Jacqulynn Cadet M.D.   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 for malignancy LUL collapse, Mass effect and narrowing of Left Main pulmonary artery No evidence of diatant metastatic disease on CT Weight loss of 5 pounds in 5 months Plan: Consider IR CT biopsy or EBUS for tissue sampling and staging of suspected malignancy Albuterol Nebs prn Continue prn cough medication, consider Hycodan if Robitussin not enough Monitor oxygen saturations, titrate oxygen for saturations> 93% IS and mobilize to optimize pulmonary function of healthy lung Follow LDH >> 213, smear pending PFT's once staged to determine if surgical candidate Oncology consult Based on tissue sampling and staging consider either TCTS consult/ oncology or  or palliative radiation therapy Will need PET scan as  outpatient Will need MRI brain  ? Role for additional decadron  DVT prophylaxis>> if pharmacological agent:  Lovenox preferred  30 minutes CC time  Magdalen Spatz, AGACNP-BC [place] Pager # [phone] or  Pager: [phone]  07/06/2017, 11:36 AM  STAFF NOTE: Linwood Dibbles, MD FACP have personally reviewed patient's available data, including medical history, events of note, physical examination and test results as part of my evaluation. I have discussed with resident/NP and other care providers such as pharmacist, RN and RRT. In addition, I personally evaluated patient and elicited key findings of: awake ,a lert, no distress in chair, hoarseness to voice, no mass in neck or lymphadenopathy, agophany anterior left and coarse, clear to right, abdo soft, no masses, no edema, pcxr and CT I reviewed - revealed large mass mediastinal LUL with collapse atx, the mass is concerning around some of the PA, the lesion at chest wall may be atx lung from collapse, would favor Bronch with BX as likely to see endobrochial lesion and will bx, if none seen would use ebus on second case, he is set up for bronch wed 11 am, all questions ansered and npo and consented verbal, etiology not clear, lymphoma, thyroid, lung?  Lavon Paganini. Titus Mould, MD, [place] Pgr: [facility] 07/06/2017 3:12 PM

## 2017-07-07 NOTE — Interval H&P Note (Signed)
PCCM Interval Note  61 yo former smoker admitted with dyspnea, hoarseness and low volume hemoptysis. CT chest confirmed LUL bronchus obstruction, LUL mass and post-obstructive volume loss. He is brought now for bronchoscopy and further eval of the LUL mass. He is hypertensive to 200's/ 120's. No complaints.   Vitals:   07/07/17 0855 07/07/17 0900 07/07/17 0905 07/07/17 0910  BP: (!) 205/130 (!) 189/123 (!) 186/120 (!) 186/120  Pulse: 97 (!) 101 96 97  Resp: 16 16 17 15   Temp:      TempSrc:      SpO2: 95% 95% 94% 91%  Weight:      Height:       Gen: Pleasant, well-nourished, in no distress,  normal affect  ENT: No lesions,  mouth clear,  oropharynx clear, no postnasal drip  Neck: No JVD, no stridor  Lungs: No use of accessory muscles, decreased BS on L, R clear  Cardiovascular: RRR, heart sounds normal, no murmur or gallops, no peripheral edema  Musculoskeletal: No deformities, no cyanosis or clubbing  Neuro: alert, non focal  Skin: Warm, no lesions or rashes   Recent Labs Lab 07/05/17 1759 07/06/17 0357  HGB 15.8 14.4  HCT 46.4 44.0  WBC 8.2 4.3  PLT 312 297    Recent Labs Lab 07/05/17 2346  INR 1.02    Impression:  LUL mass with post-obstructive volume loss, 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  Hypertension  Plans:  BP control with hydralazine and sedation. Once well controlled then proceed Bronchoscopy now. Suspect that he will have a LUL endobronchial lesion to sample. Procedure risks and benefits explained to patient. He understands and agrees to proceed.   Baltazar Apo, MD, PhD 07/07/2017, 9:43 AM [facility] [phone] or if no answer [phone]

## 2017-07-07 NOTE — Progress Notes (Signed)
Educated pt numerous times r/t the importance of using urinal so that staff can measure output; however, pt continues to use bathroom toilet and self-flushes.  Therefore, I/O's are inaccurate.

## 2017-07-07 NOTE — Progress Notes (Signed)
Video bronchoscopy performed.    Intervention bronchial brushing. Intervention bronchial biopsy.  No complications noted.  Will continue to monitor.

## 2017-07-08 ENCOUNTER — Other Ambulatory Visit: Payer: Self-pay | Admitting: Oncology

## 2017-07-08 ENCOUNTER — Encounter (HOSPITAL_COMMUNITY): Payer: Self-pay | Admitting: Emergency Medicine

## 2017-07-08 DIAGNOSIS — R918 Other nonspecific abnormal finding of lung field: Secondary | ICD-10-CM

## 2017-07-08 DIAGNOSIS — J441 Chronic obstructive pulmonary disease with (acute) exacerbation: Secondary | ICD-10-CM

## 2017-07-08 DIAGNOSIS — R0902 Hypoxemia: Secondary | ICD-10-CM

## 2017-07-08 LAB — CBC
HCT: 45.9 % (ref 39.0–52.0)
HEMOGLOBIN: 15.5 g/dL (ref 13.0–17.0)
MCH: 32.7 pg (ref 26.0–34.0)
MCHC: 33.8 g/dL (ref 30.0–36.0)
MCV: 96.8 fL (ref 78.0–100.0)
PLATELETS: 317 10*3/uL (ref 150–400)
RBC: 4.74 MIL/uL (ref 4.22–5.81)
RDW: 13.9 % (ref 11.5–15.5)
WBC: 9.8 10*3/uL (ref 4.0–10.5)

## 2017-07-08 LAB — BASIC METABOLIC PANEL
Anion gap: 8 (ref 5–15)
BUN: 7 mg/dL (ref 6–20)
CO2: 29 mmol/L (ref 22–32)
Calcium: 9.4 mg/dL (ref 8.9–10.3)
Chloride: 97 mmol/L — ABNORMAL LOW (ref 101–111)
Creatinine, Ser: 0.91 mg/dL (ref 0.61–1.24)
Glucose, Bld: 109 mg/dL — ABNORMAL HIGH (ref 65–99)
POTASSIUM: 3.7 mmol/L (ref 3.5–5.1)
SODIUM: 134 mmol/L — AB (ref 135–145)

## 2017-07-08 LAB — MAGNESIUM: MAGNESIUM: 2.1 mg/dL (ref 1.7–2.4)

## 2017-07-08 MED ORDER — ASPIRIN EC 81 MG PO TBEC: 81.0000 mg | DELAYED_RELEASE_TABLET | Freq: Every day | ORAL | 0 refills | Status: AC

## 2017-07-08 MED ORDER — METOPROLOL TARTRATE 25 MG PO TABS: 12.5000 mg | ORAL_TABLET | Freq: Two times a day (BID) | ORAL | 0 refills | Status: DC

## 2017-07-08 MED ORDER — LEVALBUTEROL HCL 1.25 MG/0.5ML IN NEBU: 1.2500 mg | INHALATION_SOLUTION | Freq: Three times a day (TID) | RESPIRATORY_TRACT | Status: DC | PRN

## 2017-07-08 NOTE — Progress Notes (Signed)
Name: Levi Brown MRN: [id number] DOB: 1956-10-21    ADMISSION DATE:  07/05/2017 CONSULTATION DATE:  07/06/2017  REFERRING MD :  Ivor Costa  CHIEF COMPLAINT: Shortness of breath, cough, hemoptysis and hoarseness   BRIEF PATIENT DESCRIPTION:  AA Male  sitting in chair in NAD on RA. Very hoarse voice.  SIGNIFICANT EVENTS  07/05/2017>> Admit  STUDIES:  07/05/2017>>CT Chest with contrast : Large, amorphous approximately 6.6 x 6.5 x 5.2 cm mass centered in the left mediastinum which completely obliterates the left upper lobe bronchus and impinges into the left lower lobe bronchus. There is also mass effect and significant narrowing of the left main pulmonary artery. Differential considerations include lymphoma, thymic malignancy, and potentially small cell lung cancer. No evidence of distant metastatic disease. 2. Complete collapse of the left upper lobe. It is difficult to identify where the primary mediastinal mass ends and atelectatic lung begins. Direct invasion into the lung is not excluded.  3. 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 male without significant past medical history except for former smoker, who presents to the ED 07/05/2017 with shortness of breath, cough, hemoptysis or hoarseness.   Patient states that he has been having dry cough, SOB, hoarse voice for 6 weeks, which has been progressively getting worse. Patient does not have chest pain. No fever or chills. Occasionally he has mild hemoptysis with blood-streaked sputum. No night sweats. He was treated with Amoxicillin recently with no improvement in his symptoms. No calf tenderness. He denies PND or orthopnea. Patient does not have nausea, vomiting, diarrhea, abdominal pain, symptoms of UTI or unilateral weakness.  Pt was seen in urgent care and found to have significantly abnormal chest x-ray concerning for thoracic aortic aneurysm versus  cancer, therefore sent to ED for further evaluation and treatment. Patient states that he lost 5 pounds in the past 5 months. Patient states that he does not have a primary care physician. He smoked 30-pack-years and quit 1.5 years ago. He works in the Pharmacist, community , [employer].  ED Course: pt was found to have WBC 22, electrolytes renal function okay, temperature 98.6, tachycardia, oxygen saturation 93% on room air. Patient is admitted to telemetry bed as inpatient for further work up.  CCM were consulted to assist with management of mediastinal mass  SUBJECTIVE:  No events overnight, no hemoptysis  VITAL SIGNS: Temp:  [97.9 F (36.6 C)-99 F (37.2 C)] 98.1 F (36.7 C) (07/26 1138) Pulse Rate:  [89-107] 94 (07/26 1138) Resp:  [18] 18 (07/26 1138) BP: (120-158)/(84-104) 139/92 (07/26 1138) SpO2:  [94 %-100 %] 94 % (07/26 1138) Weight:  [72.5 kg (159 lb 12.8 oz)] 72.5 kg (159 lb 12.8 oz) (07/26 0425)  PHYSICAL EXAMINATION: General:  Awake and interactive, NAD Neuro: A&Ox3, moving all ext to command HEENT: [place]/AT, PERRL, EOM-I and MMM Cardiovascular: RRR, Nl S1/S2, -M/R/G. Lungs: CTA bilaterally Abdomen: Soft, NT, ND and +BS Musculoskeletal: -edema and -tenderness Skin:  Warm Dry and intact, no lesions or rash noted  Recent Labs Lab 07/05/17 1759 07/06/17 0357 07/08/17 0454  NA 136 135 134*  K 3.5 4.0 3.7  CL 96* 97* 97*  CO2 31 30 29   BUN <5* 6 7  CREATININE 0.84 0.90 0.91  GLUCOSE 103* 156* 109*   Recent Labs Lab 07/05/17 1759 07/06/17 0357 07/08/17 0454  HGB 15.8 14.4 15.5  HCT 46.4 44.0 45.9  WBC 8.2  4.3 9.8  PLT 312 297 317   Mr Brain W Wo Contrast  Result Date: 07/07/2017 CLINICAL DATA:  Initial evaluation for cancer staging. EXAM: MRI HEAD WITHOUT AND WITH CONTRAST TECHNIQUE: Multiplanar, multiecho pulse sequences of the brain and surrounding structures were obtained without and with intravenous contrast. CONTRAST:  15 cc of MultiHance. COMPARISON:   None available. FINDINGS: Brain: Generalized age related cerebral volume loss present. Patchy T2/FLAIR hyperintensity within the periventricular and deep white matter both cerebral hemispheres, most consistent with chronic small vessel ischemic disease, mild to moderate nature. Superimposed remote lacunar infarcts present within the left basal ganglia and left thalamus. Few scatter remote lacunar infarct present within the pons as well. No abnormal foci of restricted diffusion to suggest acute or subacute ischemia. Gray-white matter differentiation maintained. No remote cortical infarction. No evidence for acute or chronic intracranial hemorrhage. No mass lesion, midline shift or mass effect. No hydrocephalus. No extra-axial fluid collection. Major dural sinuses are patent. No abnormal enhancement. No evidence for intracranial metastatic disease. Pituitary gland and suprasellar region within normal limits. Midline structures intact and normal. Vascular: Major intracranial vascular flow voids are maintained. Skull and upper cervical spine: Craniocervical junction within normal limits. Visualized upper cervical spine unremarkable. Bone marrow signal intensity within normal limits. No scalp soft tissue abnormality. Sinuses/Orbits: Globes and orbital soft tissues within normal limits. Scattered mucosal thickening with retention cyst present within the ethmoidal air cells and maxillary sinuses. Paranasal sinuses are otherwise clear. Trace left mastoid effusion, of doubtful significance. Inner ear structures normal. IMPRESSION: 1. No acute intracranial process. No evidence for intracranial metastatic disease. 2. Mild to moderate chronic small vessel ischemic disease with remote lacunar infarcts involving the left basal ganglia, left thalamus, and pons. Electronically Signed   By: Jeannine Boga M.D.   On: 07/07/2017 20:47   Ct Abdomen Pelvis W Contrast  Result Date: 07/06/2017 CLINICAL DATA:  Mediastinal mass  EXAM: CT ABDOMEN AND PELVIS WITH CONTRAST TECHNIQUE: Multidetector CT imaging of the abdomen and pelvis was performed using the standard protocol following bolus administration of intravenous contrast. CONTRAST:  100 mL Isovue 300 intravenous COMPARISON:  CT chest 07/05/2017 FINDINGS: Lower chest: Lung bases demonstrate no acute consolidation or pleural effusion. Heart size upper normal. Patient has history of right-sided aortic arch, and the descending thoracic aorta is seen to the right of the spine. Hepatobiliary: No focal liver abnormality is seen. No gallstones, gallbladder wall thickening, or biliary dilatation. Pancreas: Unremarkable. No pancreatic ductal dilatation or surrounding inflammatory changes. Spleen: Normal in size without focal abnormality. Adrenals/Urinary Tract: Adrenal glands are unremarkable. Kidneys are normal, without renal calculi, focal lesion, or hydronephrosis. Bladder is unremarkable. Stomach/Bowel: Stomach is within normal limits. Appendix appears normal. No evidence of bowel wall thickening, distention, or inflammatory changes. Vascular/Lymphatic: Aortic atherosclerosis. No enlarged abdominal or pelvic lymph nodes. Reproductive: Prominent peripheral calcification in the prostate. Other: No free air or free fluid. Musculoskeletal: No acute or suspicious bone lesion. Degenerative changes. IMPRESSION: 1. No CT evidence for metastatic disease to the abdomen or pelvis. 2. 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 Electronically Signed   By: Donavan Foil M.D.   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 Lebron Conners from oncology. PRN albuterol nebs Continue prn cough medication, consider Hycodan if Robitussin not enough Monitor oxygen saturations, titrate oxygen for saturations> 93% Titrate O2 for sat of 88-92% IS and mobilize to optimize pulmonary function of healthy lung Follow LDH >> 213, smear pending Will need PFTs as outpatient Will need staging, defer to oncology Message left with oncology office. Will need PET scan as outpatient DVT prophylaxis>> if pharmacological agent:  Lovenox preferred  Discussed with PCCM-NP and oncology MD (Dr. Jana Hakim)  PCCM will sign off, please call back if needed.  Rush Farmer, M.D. [facility]. Pager: [phone]. After hours pager: [phone].

## 2017-07-08 NOTE — Care Management Note (Signed)
Case Management Note  Patient Details  Name: Levi Brown MRN: [id number] Date of Birth: [birth year]  Subjective/Objective:  Mediastinal Mass                 Action/Plan: Patient is independent of his ADL's, owns a business and did not think that he needed health insurance because he was never sick. Patient is agreeable to go to the [facility] for primary care; CM also told him that they have a pharmacy there and SW to assist him with resources in the community; He stated that he talked to his family and is working out a Secretary/administrator to [employer].  Expected Discharge Date:  07/08/17               Expected Discharge Plan:  Home/Self Care  In-House Referral:   Financial Counselor  Discharge planning Services  CM Consult, [facility]  Status of Service:  In process, will continue to follow  Sherrilyn Rist [phone] 07/08/2017, 3:54 PM

## 2017-07-08 NOTE — Discharge Summary (Signed)
Discharge Summary  Levi Brown WTU:[id number] DOB: 10/20/1956  PCP: Patient, No Pcp Per  Admit date: 07/05/2017 Discharge date: 07/08/2017  Time spent: <70mins  Recommendations for Outpatient Follow-up:  1. F/u with PMD within a week  for hospital discharge follow up, repeat cbc/bmp at follow up 2. F/u with oncology on 7/31  Discharge Diagnoses:  Active Hospital Problems   Diagnosis Date Noted  . 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 Elevated blood pressure reading 07/05/2017    Resolved Hospital Problems   Diagnosis Date Noted Date Resolved  No resolved problems to display.    Discharge Condition: stable  Diet recommendation: heart healthy  Filed Weights   07/07/17 0549 07/07/17 0832 07/08/17 0425  Weight: 72.7 kg (160 lb 3.2 oz) 72.6 kg (160 lb) 72.5 kg (159 lb 12.8 oz)    History of present illness:  PCP: Patient, No Pcp Per   Patient coming from:  The patient is coming from home.  At baseline, pt is independent for most of ADL.   Chief Complaint: Shortness of breath, cough, hemoptysis and hoarseness  HPI: Levi Brown is a 61 y.o. male without significant past medical history except for former smoking, who presents with shortness of breath, cough, hemoptysis or hoarseness.   Patient states that he has been having dry cough, SOB, hoarse voice for 6 weeks, which has been progressively getting worse. Patient does not have chest pain. No fever or chills. Occasionally he has mild hemoptysis with blood-streaked sputum. No night sweats. He was treated with Amoxicillin recently with no improvement in his symptoms. No calf tenderness. He denies PND or orthopnea. Patient does not have nausea, vomiting, diarrhea, abdominal pain, symptoms of UTI or unilateral weakness.  Pt was seen in urgent care and found to have significantly abnormal chest x-ray concerning for thoracic aortic aneurysm versus cancer, therefore sent to ED for further evaluation and treatment. Patient states  that he lost 5 pounds in the past 5 months. Patient states that he does not have a primary care physician. He smoked 30-pack-years and quit 1.5 years ago.   ED Course: pt was found to have WBC 22, electrolytes renal function okay, temperature 98.6, tachycardia, oxygen saturation 93% on room air. Patient is admitted to telemetry bed as inpatient.  CT of the chest with contrast showed: 1. 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 There is also mass effect and significant narrowing of the left main pulmonary artery. Differential considerations include lymphoma, thymic malignancy, and potentially small cell lung cancer. No evidence of distant metastatic disease. 2. Complete collapse of the left upper lobe. It is difficult to identify where the primary mediastinal mass ends and atelectatic lung begins. Direct invasion into the lung is not excluded.  3. 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 -patient presented with Shortness of breath and horseness (former smoker), he denies cough, no weight loss -CT ab/pel no mets, mri brain no mets  -s/p bronchoscopy with bronchial brushing and biopsy on 7/25, pathology with "POORLY DIFFERENTIATED SQUAMOUS CELL CARCINOMA" - I have discussed the case with oncology Dr Letta Pate who recommend patient to follow up with oncology on 7/31  Essential hypertension              -  he was previously not on meds, he is discharged on lopressor, he need to follow up with pmd for blood pressure meds titration  H/o remote lacunar infarcts involving the left basal ganglia, left thalamus, and pons Incidental findings on  MRI brain, patient does not have symptom, he is not aware any history of stroke. He is started on lopressor, asa, pmd follow up.  DVT prophylaxis: scd Code Status: full Family Communication: patient  Disposition Plan:  home with oncology follow up on 7/31   Consultants:   Pulmonary  oncology  Procedures:  s/p bronchoscopy with bronchial brushing and biopsy on 7/25  Antimicrobials:  none   Discharge Exam: BP (!) 139/92 (BP Location: Left Arm)   Pulse 94   Temp 98.1 F (36.7 C) (Oral)   Resp 18   Ht 5\' 6"  (1.676 m)   Wt 72.5 kg (159 lb 12.8 oz)   SpO2 94%   BMI 25.79 kg/m   General: NAD Cardiovascular: RRR Respiratory: CTABL, no wheezing, no rales, no rhonchi Extremity: no edema  Discharge Instructions You were cared for by a hospitalist during your hospital stay. If you have any questions about your discharge medications or the care you received while you were in the hospital after you are discharged, you can call the unit and asked to speak with the hospitalist on call if the hospitalist that took care of you is not available. Once you are discharged, your primary care physician will handle any further medical issues. Please note that NO REFILLS for any discharge medications will be authorized once you are discharged, as it is imperative that you return to your primary care physician (or establish a relationship with a primary care physician if you do not have one) for your aftercare needs so that they can reassess your need for medications and monitor your lab values.  Discharge Instructions    Diet - low sodium heart healthy    Complete by:  As directed    Increase activity slowly    Complete by:  As directed      Allergies as of 07/08/2017   No Known Allergies     Medication List    TAKE these medications   acetaminophen 325 MG tablet Commonly known as:  TYLENOL Take 325 mg by mouth every 6 (six) hours as needed for moderate pain.   aspirin EC 81 MG  tablet Take 1 tablet (81 mg total) by mouth daily.   COUGH DM PO Take 30 mLs by mouth daily as needed (cough).   DRY EYE FORMULA PO Place 1 drop into both eyes daily.   metoprolol tartrate 25 MG tablet Commonly known as:  LOPRESSOR Take 0.5 tablets (12.5 mg total) by mouth 2 (two) times daily.      No Known Allergies Follow-up Information    Ardath Sax, MD Follow up on 07/13/2017.   Specialty:  Hematology and Oncology Why:  3:30pm - [facility] Contact information: [place] [address] [phone]        [facility] Follow up in 2 week(s).   Why:  hospital discharge follow up Contact information: [address] [place] [address] [phone]           The results of significant diagnostics from this hospitalization (including imaging, microbiology, ancillary and laboratory) are listed below for reference.  Significant Diagnostic Studies: Dg Chest 2 View  Result Date: 07/05/2017 CLINICAL DATA:  Shortness of breath and wheezing. EXAM: CHEST  2 VIEW COMPARISON:  None. FINDINGS: Marked enlargement of the mediastinum and the patient may even have a right aortic arch. However, the mediastinal tissue is more prominent on the left side. Trachea is midline. Heart size appears to be grossly normal. There is mild elevation of the left hemidiaphragm. Lungs are clear without airspace disease or pulmonary edema. No large pleural effusions. IMPRESSION: Enlargement and abnormal appearance of the mediastinum. Differential diagnosis would include a mediastinal/lung mass, lymphadenopathy and/or thoracic aortic aneurysm. Recommend further characterization with a chest CT with IV contrast. These results will be called to the ordering clinician or representative by the Radiologist Assistant, and communication documented in the PACS or zVision Dashboard. Electronically Signed   By: Markus Daft M.D.   On:  07/05/2017 13:20   Ct Chest W Contrast  Result Date: 07/05/2017 CLINICAL DATA:  61 year old male with shortness of breath for the past month and hoarseness for the past month and a half EXAM: CT CHEST WITH CONTRAST TECHNIQUE: Multidetector CT imaging of the chest was performed during intravenous contrast administration. CONTRAST:  172mL ISOVUE-300 IOPAMIDOL (ISOVUE-300) INJECTION 61% COMPARISON:  Chest x-ray obtained earlier today FINDINGS: Cardiovascular: Right-sided aortic arch with aberrant left subclavian artery. Diffuse narrowing of the left main pulmonary artery secondary to local mass effect from the mediastinal mass. No evidence of aortic aneurysm. The heart is normal in size. No pericardial effusion. Mediastinum/Nodes: Ill-defined mediastinal mass which is difficult to measure precisely. The mass measures at least 6.5 x 5.2 x 6.6 cm and is contiguous with collapse of the left upper lobe. The mass completely obliterates the left upper lobe bronchus and likely impinges upon the left lower lobe bronchus. No additional mediastinal adenopathy identified. Lungs/Pleura: Complete collapse of the left upper lobe. There is no aeration within the left upper lobe. Secondary hyperinflation of the left lower lobe. The aerated portion of the lungs are clear. No pulmonary mass or nodules. Upper Abdomen: Visualized upper abdominal organs are unremarkable. Musculoskeletal: No acute fracture or aggressive appearing lytic or blastic osseous lesion. IMPRESSION: 1. 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 There is also mass effect and significant narrowing of the left main pulmonary artery. Differential considerations include lymphoma, thymic malignancy, and potentially small cell lung cancer. No evidence of distant metastatic disease. 2. Complete collapse of the left upper  lobe. It is difficult to identify where the primary mediastinal mass ends and atelectatic lung begins. Direct invasion into the lung is not excluded. 3. 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 Electronically Signed   By: Jacqulynn Cadet M.D.   On: 07/05/2017 20:58   Mr Jeri Cos WC Contrast  Result Date: 07/07/2017 CLINICAL DATA:  Initial evaluation for cancer staging. EXAM: MRI HEAD WITHOUT AND WITH CONTRAST TECHNIQUE: Multiplanar, multiecho pulse sequences of the brain and surrounding structures were obtained without and with intravenous contrast. CONTRAST:  15 cc of MultiHance. COMPARISON:  None available. FINDINGS: Brain: Generalized age related cerebral volume loss present. Patchy T2/FLAIR hyperintensity within the periventricular and deep white matter both cerebral hemispheres, most consistent with chronic small vessel ischemic disease, mild to moderate nature. Superimposed remote lacunar infarcts present within the left basal ganglia and left thalamus. Few scatter remote lacunar infarct present within  the pons as well. No abnormal foci of restricted diffusion to suggest acute or subacute ischemia. Gray-white matter differentiation maintained. No remote cortical infarction. No evidence for acute or chronic intracranial hemorrhage. No mass lesion, midline shift or mass effect. No hydrocephalus. No extra-axial fluid collection. Major dural sinuses are patent. No abnormal enhancement. No evidence for intracranial metastatic disease. Pituitary gland and suprasellar region within normal limits. Midline structures intact and normal. Vascular: Major intracranial vascular flow voids are maintained. Skull and upper cervical spine: Craniocervical junction within normal limits. Visualized upper cervical spine unremarkable. Bone marrow signal intensity within normal limits. No scalp soft tissue abnormality. Sinuses/Orbits: Globes and orbital soft tissues within normal limits. Scattered mucosal  thickening with retention cyst present within the ethmoidal air cells and maxillary sinuses. Paranasal sinuses are otherwise clear. Trace left mastoid effusion, of doubtful significance. Inner ear structures normal. IMPRESSION: 1. No acute intracranial process. No evidence for intracranial metastatic disease. 2. Mild to moderate chronic small vessel ischemic disease with remote lacunar infarcts involving the left basal ganglia, left thalamus, and pons. Electronically Signed   By: Jeannine Boga M.D.   On: 07/07/2017 20:47   Ct Abdomen Pelvis W Contrast  Result Date: 07/06/2017 CLINICAL DATA:  Mediastinal mass EXAM: CT ABDOMEN AND PELVIS WITH CONTRAST TECHNIQUE: Multidetector CT imaging of the abdomen and pelvis was performed using the standard protocol following bolus administration of intravenous contrast. CONTRAST:  100 mL Isovue 300 intravenous COMPARISON:  CT chest 07/05/2017 FINDINGS: Lower chest: Lung bases demonstrate no acute consolidation or pleural effusion. Heart size upper normal. Patient has history of right-sided aortic arch, and the descending thoracic aorta is seen to the right of the spine. Hepatobiliary: No focal liver abnormality is seen. No gallstones, gallbladder wall thickening, or biliary dilatation. Pancreas: Unremarkable. No pancreatic ductal dilatation or surrounding inflammatory changes. Spleen: Normal in size without focal abnormality. Adrenals/Urinary Tract: Adrenal glands are unremarkable. Kidneys are normal, without renal calculi, focal lesion, or hydronephrosis. Bladder is unremarkable. Stomach/Bowel: Stomach is within normal limits. Appendix appears normal. No evidence of bowel wall thickening, distention, or inflammatory changes. Vascular/Lymphatic: Aortic atherosclerosis. No enlarged abdominal or pelvic lymph nodes. Reproductive: Prominent peripheral calcification in the prostate. Other: No free air or free fluid. Musculoskeletal: No acute or suspicious bone lesion.  Degenerative changes. IMPRESSION: 1. No CT evidence for metastatic disease to the abdomen or pelvis. 2. 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 Electronically Signed   By: Donavan Foil M.D.   On: 07/06/2017 22:17    Microbiology: No results found for this or any previous visit (from the past 240 hour(s)).   Labs: Basic Metabolic Panel:  Recent Labs Lab 07/05/17 1759 07/06/17 0357 07/06/17 1635 07/08/17 0454  NA 136 135  --  134*  K 3.5 4.0  --  3.7  CL 96* 97*  --  97*  CO2 31 30  --  29  GLUCOSE 103* 156*  --  109*  BUN <5* 6  --  7  CREATININE 0.84 0.90  --  0.91  CALCIUM 9.5 9.2  --  9.4  MG  --   --   --  2.1  PHOS  --   --  3.0  --    Liver Function Tests:  Recent Labs Lab 07/05/17 1759  AST 21  ALT 15*  ALKPHOS 86  BILITOT 1.0  PROT 7.5  ALBUMIN 4.1   No results for input(s): LIPASE, AMYLASE in the last  168 hours. No results for input(s): AMMONIA in the last 168 hours. CBC:  Recent Labs Lab 07/05/17 1759 07/06/17 0357 07/08/17 0454  WBC 8.2 4.3 9.8  NEUTROABS 5.5  --   --   HGB 15.8 14.4 15.5  HCT 46.4 44.0 45.9  MCV 96.5 97.6 96.8  PLT 312 297 317   Cardiac Enzymes: No results for input(s): CKTOTAL, CKMB, CKMBINDEX, TROPONINI in the last 168 hours. BNP: BNP (last 3 results) No results for input(s): BNP in the last 8760 hours.  ProBNP (last 3 results) No results for input(s): PROBNP in the last 8760 hours.  CBG: No results for input(s): GLUCAP in the last 168 hours.     SignedFlorencia Reasons MD, PhD  [facility] 07/08/2017, 3:55 PM

## 2017-07-09 LAB — HEMOGLOBIN A1C
Hgb A1c MFr Bld: 4.9 % (ref 4.8–5.6)
MEAN PLASMA GLUCOSE: 94 mg/dL

## 2017-07-13 ENCOUNTER — Encounter: Payer: Self-pay | Admitting: Hematology and Oncology

## 2017-07-13 ENCOUNTER — Telehealth: Payer: Self-pay | Admitting: Hematology and Oncology

## 2017-07-13 ENCOUNTER — Ambulatory Visit (HOSPITAL_BASED_OUTPATIENT_CLINIC_OR_DEPARTMENT_OTHER): Payer: Self-pay | Admitting: Hematology and Oncology

## 2017-07-13 ENCOUNTER — Encounter: Payer: Self-pay | Admitting: Family Medicine

## 2017-07-13 ENCOUNTER — Ambulatory Visit (INDEPENDENT_AMBULATORY_CARE_PROVIDER_SITE_OTHER): Payer: Self-pay | Admitting: Family Medicine

## 2017-07-13 ENCOUNTER — Ambulatory Visit: Payer: Self-pay | Admitting: Hematology and Oncology

## 2017-07-13 VITALS — BP 147/95 | HR 98 | Temp 99.8°F | Resp 18 | Ht 66.0 in | Wt 165.2 lb

## 2017-07-13 VITALS — BP 150/80 | HR 91 | Temp 98.0°F | Resp 16 | Ht 66.0 in | Wt 163.6 lb

## 2017-07-13 DIAGNOSIS — J9859 Other diseases of mediastinum, not elsewhere classified: Secondary | ICD-10-CM

## 2017-07-13 DIAGNOSIS — I10 Essential (primary) hypertension: Secondary | ICD-10-CM

## 2017-07-13 DIAGNOSIS — C3412 Malignant neoplasm of upper lobe, left bronchus or lung: Secondary | ICD-10-CM | POA: Insufficient documentation

## 2017-07-13 DIAGNOSIS — C3492 Malignant neoplasm of unspecified part of left bronchus or lung: Secondary | ICD-10-CM

## 2017-07-13 DIAGNOSIS — R0602 Shortness of breath: Secondary | ICD-10-CM

## 2017-07-13 HISTORY — DX: Malignant neoplasm of unspecified part of left bronchus or lung: C34.92

## 2017-07-13 MED ORDER — AMLODIPINE BESYLATE 5 MG PO TABS: 5.0000 mg | ORAL_TABLET | Freq: Every day | ORAL | 3 refills | Status: DC

## 2017-07-13 MED ORDER — ALBUTEROL SULFATE (2.5 MG/3ML) 0.083% IN NEBU
2.5000 mg | INHALATION_SOLUTION | Freq: Once | RESPIRATORY_TRACT | Status: AC
  Administered 2017-07-13: 2.5 mg via RESPIRATORY_TRACT

## 2017-07-13 MED ORDER — FLUTICASONE FUROATE 100 MCG/ACT IN AEPB: 2.0000 | INHALATION_SPRAY | Freq: Every day | RESPIRATORY_TRACT | 11 refills | Status: DC

## 2017-07-13 MED ORDER — IPRATROPIUM BROMIDE 0.02 % IN SOLN
0.5000 mg | Freq: Once | RESPIRATORY_TRACT | Status: AC
  Administered 2017-07-13: 0.5 mg via RESPIRATORY_TRACT

## 2017-07-13 MED ORDER — ALBUTEROL SULFATE HFA 108 (90 BASE) MCG/ACT IN AERS
2.0000 | INHALATION_SPRAY | RESPIRATORY_TRACT | 1 refills | Status: DC | PRN
Start: 2017-07-13 — End: 2017-08-17

## 2017-07-13 MED ORDER — METOPROLOL TARTRATE 25 MG PO TABS: 12.5000 mg | ORAL_TABLET | Freq: Two times a day (BID) | ORAL | 3 refills | Status: DC

## 2017-07-13 MED FILL — **ARNUITY ELLIPTA 100 MCG I: 100 MCG | 28 days supply | Qty: 56 | Fill #0

## 2017-07-13 MED FILL — METOPROLOL TARTRATE 25 MG T: 25 | 30 days supply | Qty: 30 | Fill #0

## 2017-07-13 MED FILL — AMLODIPINE BESYLATE 5 MG TA: 5 | 30 days supply | Qty: 30 | Fill #0

## 2017-07-13 MED FILL — !VENTOLIN HFA INHALER: 108 (90 BAS | 25 days supply | Qty: 18 | Fill #0

## 2017-07-13 NOTE — Telephone Encounter (Signed)
Gave patient avs form about upcoming appointments in August.   [facility] will contact him about his PET scan and IR will contact him about his port.

## 2017-07-13 NOTE — Progress Notes (Signed)
[place] [facility] Follow-up Visit:  Assessment: Non-small cell cancer of left lung [facility]) 61 year old male with minimal precedent comorbidities who initially presented with shortness of breath and voice hoarseness and was found to have a large mass in the left mediastinum extending into the left lung. Dating imaging with CT of the abdomen and pelvis and MRI of the brain revealed no metastatic disease elsewhere. 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  Based on the current extent of the disease, I believe patient has a T4 N2 M0 disease at least consistent with stage IIIB lung cancer. This is advanced disease with small chance of curative intent therapy. Clear differentiation from stage IV disease as necessary and I do recommend additional assessment with a PET/CT. If additional disease is not discovered, I suggested proceeding with combined chemoradiotherapy. Patient is young and healthy and is likely to be able to tolerate combination of cisplatin and etoposide administered concurrently with radiation if he is a candidate for the latter.   Plan: --PET-CT --Consult Radiation Oncology for possible radiotherapy --Consult IR for infusaport placement --Chemo-Ed and possible Cisplatin/etoposide on RTC with [place].  Voice recognition software was used and creation of this note. Despite my best effort at editing the text, some misspelling/errors may have occurred.   Orders Placed This Encounter  Procedures  . NM PET Image Initial (PI) Skull Base To Thigh    Standing Status:   Future    Standing Expiration Date:   07/13/2018    Order Specific Question:   Reason for Exam (SYMPTOM  OR DIAGNOSIS REQUIRED)    Answer:   New NSCLC diagnosis, please eval for met disease    Order Specific Question:   If indicated for the ordered procedure, I authorize the administration of a radiopharmaceutical per  Radiology protocol    Answer:   Yes    Order Specific Question:   Preferred imaging location?    Answer:   [facility]    Order Specific Question:   Radiology Contrast Protocol - do NOT remove file path    Answer:   _[email or web address]  . IR FLUORO GUIDE PORT INSERTION RIGHT    Standing Status:   Future    Standing Expiration Date:   09/12/2018    Order Specific Question:   Reason for Exam (SYMPTOM  OR DIAGNOSIS REQUIRED)    Answer:   New NSCLC diagnosis -- will be needing systemic therapy    Order Specific Question:   Preferred Imaging Location?    Answer:   [facility]  . Ambulatory referral to Radiation Oncology    Referral Priority:   Routine    Referral Type:   Consultation    Referral Reason:   Specialty Services Required    Requested Specialty:   Radiation Oncology    Number of Visits Requested:   1    Cancer Staging Non-small cell cancer of left lung [facility]) Staging form: Lung, AJCC 8th Edition - Clinical stage from 07/13/2017: Stage IIIB (cT4, cN2, cM0) - Signed by Ardath Sax, MD on 07/13/2017   All questions were answered.  . The patient knows to call the clinic with any problems, questions or concerns.  This note was electronically signed.    History of Presenting Illness Levi Brown 61 y.o. following up with the [facility] for recent diagnosis of squamous cell carcinoma non-small cell lung cancer. Patient presents to discuss the diagnosis, staging, and plan  the treatment. At this time, patient denies any new symptoms. She is to experience dyspnea with exertion, nonproductive cough at this time without recurrence of hemoptysis. Seems to have weak voice.  Patient initially presented with complaints of changes in voice, cough, and hemoptysis yesterday. He reports that the hoarseness of the voice has started at the end of May 2018. He is subsequently developed of, progressive dyspnea with exertion, and eventually  hemoptysis since the mid-June. Patient denies having any fevers, chills, night sweats. Denies any significant changes to his activity level, he has continued working through the symptoms although he did need to take more rest. He did not have any headaches, vision changes, focal weakness or severe deficits, and copies, or seizures. He denies dysphagia, although he does have persistent sensation of fullness in his throat. He denies abdominal pain, nausea, vomiting, diarrhea, or constipation. Denies chest pain, palpitations. No shortness of breath at rest. He did have a single episode of swelling in bilateral ankles a few days ago, but no persistent swelling in the lower extremities. He denies any new skin rashes. No urinary symptoms.  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 The mass measured 6.6 cm in the largest dimension. No additional lymphadenopathy was visualized in the mediastinum, supraclavicular areas, and no additional pulmonary masses on the contralateral side. The visualized portion of the liver was normal.   Oncological/hematological History:   Non-small cell cancer of left lung ([place])   07/05/2017 Imaging    CT Chest: amorphous 6.6 x 6.5 x 5.2 cm mass centered in the left mediastinum which completely obliterates the left upper lobe bronchus and impinges into the left lower lobe bronchus. There is also mass effect and significant narrowing of the left main pulmonary artery. No evidence of distant metastatic disease. Complete collapse of the left upper lobe. It is difficult to identify where the primary mediastinal mass ends and atelectatic lung begins. Direct invasion into the lung is not excluded.      07/06/2017 Imaging    CT Abdomen/Pelvis: No CT evidence for metastatic disease to the abdomen or  pelvis.      07/07/2017 Imaging    No acute intracranial process. No evidence for intracranial metastatic disease. Mild to moderate chronic small vessel ischemic disease with remote lacunar infarcts involving the left basal ganglia, left thalamus, and pons.      07/13/2017 Initial Diagnosis    Non-small cell cancer of left lung [facility]): high-grade poorly differentiated squamous cell carcinoma based on biopsy on 07/06/17       Medical History: Past Medical History:  Diagnosis Date  . Former smoker    Quit smoking in early 2017  . Non-small cell cancer of left lung ([place]) 07/13/2017    Surgical History: Past Surgical History:  Procedure Laterality Date  . VIDEO BRONCHOSCOPY Bilateral 07/07/2017   Procedure: VIDEO BRONCHOSCOPY WITHOUT FLUORO;  Surgeon: Collene Gobble, MD;  Location: [facility] ENDOSCOPY;  Service: Cardiopulmonary;  Laterality: Bilateral;    Family History: Family History  Problem Relation Age of Onset  . Asthma Mother   . COPD Mother   . Cancer Neg Hx     Social History: Social History   Social History  . Marital status: Single    Spouse name: N/A  . Number of children: N/A  . Years of education:  N/A   Occupational History  . Not on file.   Social History Main Topics  . Smoking status: Former Smoker    Packs/day: 1.00    Years: 30.00    Types: Cigarettes    Quit date: 2017  . Smokeless tobacco: Never Used     Comment: quit smoking in 2017  . Alcohol use 8.4 - 12.6 oz/week    14 - 21 Cans of beer per week  . Drug use: No  . Sexual activity: Not on file   Other Topics Concern  . Not on file   Social History Narrative  . No narrative on file    Allergies: No Known Allergies  Medications:  Current Outpatient Prescriptions  Medication Sig Dispense Refill  . acetaminophen (TYLENOL) 325 MG tablet Take 325 mg by mouth every 6 (six) hours as needed for moderate pain.    Marland Kitchen albuterol (PROVENTIL HFA;VENTOLIN HFA) 108 (90 Base) MCG/ACT inhaler Inhale 2  puffs into the lungs every 4 (four) hours as needed for wheezing or shortness of breath (cough, shortness of breath or wheezing.). 1 Inhaler 1  . amLODipine (NORVASC) 5 MG tablet Take 1 tablet (5 mg total) by mouth daily. 90 tablet 3  . aspirin EC 81 MG tablet Take 1 tablet (81 mg total) by mouth daily. 30 tablet 0  . Dextromethorphan Polistirex (COUGH DM PO) Take 30 mLs by mouth daily as needed (cough).    . Fluticasone Furoate (ARNUITY ELLIPTA) 100 MCG/ACT AEPB Inhale 2 puffs into the lungs daily. 30 each 11  . metoprolol tartrate (LOPRESSOR) 25 MG tablet Take 0.5 tablets (12.5 mg total) by mouth 2 (two) times daily. 60 tablet 3  . Multiple Vitamins-Minerals (DRY EYE FORMULA PO) Place 1 drop into both eyes daily.     No current facility-administered medications for this visit.     Review of Systems: Review of Systems  Constitutional: Positive for fatigue and unexpected weight change. Negative for appetite change, diaphoresis and fever.  HENT:   Positive for voice change. Negative for hearing loss, lump/mass, mouth sores, nosebleeds, sore throat, tinnitus and trouble swallowing.   Eyes: Negative for eye problems and icterus.  Respiratory: Positive for cough, hemoptysis, shortness of breath and wheezing. Negative for chest tightness.   Cardiovascular: Negative for chest pain, leg swelling and palpitations.  Gastrointestinal: Negative.   Endocrine: Negative for hot flashes.  Genitourinary: Negative.    Musculoskeletal: Negative.   Skin: Negative.   Neurological: Negative.   Hematological: Negative.   Psychiatric/Behavioral: Negative.      PHYSICAL EXAMINATION Blood pressure (!) 147/95, pulse 98, temperature 99.8 F (37.7 C), temperature source Oral, resp. rate 18, height 5' 6" (1.676 m), weight 165 lb 3.2 oz (74.9 kg), SpO2 100 %.  ECOG PERFORMANCE STATUS: 2 - Symptomatic, <50% confined to bed  Physical Exam  Constitutional: He is oriented to person, place, and time and  well-developed, well-nourished, and in no distress. No distress.  HENT:  Head: Normocephalic and atraumatic.  Mouth/Throat: Oropharynx is clear and moist. No oropharyngeal exudate.  Eyes: Pupils are equal, round, and reactive to light. Conjunctivae and EOM are normal. Left eye exhibits no discharge. No scleral icterus.  Neck: Normal range of motion. No thyromegaly present.  Cardiovascular: Normal rate, regular rhythm and normal heart sounds.  Exam reveals no friction rub.   No murmur heard. Pulmonary/Chest: Effort normal. No stridor. No respiratory distress.  Expiratory wheezing in the middle and lower portions of the left lung. Decreased breath sounds in the  upper left lung.  Abdominal: Soft. He exhibits no distension and no mass. There is no tenderness.  Musculoskeletal: He exhibits no edema.  Lymphadenopathy:    He has no cervical adenopathy.  Neurological: He is alert and oriented to person, place, and time. He displays normal reflexes. No cranial nerve deficit. He exhibits normal muscle tone.  Patient's voice is decreased in strength without dysarthria or dysphonia  Skin: Skin is warm and dry. No rash noted. He is not diaphoretic. No erythema.     LABORATORY DATA: I have personally reviewed the data as listed: Admission on 07/05/2017, Discharged on 07/08/2017  Component Date Value Ref Range Status  . WBC 07/05/2017 8.2  4.0 - 10.5 K/uL Final  . RBC 07/05/2017 4.81  4.22 - 5.81 MIL/uL Final  . Hemoglobin 07/05/2017 15.8  13.0 - 17.0 g/dL Final  . HCT 07/05/2017 46.4  39.0 - 52.0 % Final  . MCV 07/05/2017 96.5  78.0 - 100.0 fL Final  . MCH 07/05/2017 32.8  26.0 - 34.0 pg Final  . MCHC 07/05/2017 34.1  30.0 - 36.0 g/dL Final  . RDW 07/05/2017 13.7  11.5 - 15.5 % Final  . Platelets 07/05/2017 312  150 - 400 K/uL Final  . Neutrophils Relative % 07/05/2017 67  % Final  . Neutro Abs 07/05/2017 5.5  1.7 - 7.7 K/uL Final  . Lymphocytes Relative 07/05/2017 21  % Final  . Lymphs Abs  07/05/2017 1.7  0.7 - 4.0 K/uL Final  . Monocytes Relative 07/05/2017 9  % Final  . Monocytes Absolute 07/05/2017 0.7  0.1 - 1.0 K/uL Final  . Eosinophils Relative 07/05/2017 2  % Final  . Eosinophils Absolute 07/05/2017 0.2  0.0 - 0.7 K/uL Final  . Basophils Relative 07/05/2017 1  % Final  . Basophils Absolute 07/05/2017 0.0  0.0 - 0.1 K/uL Final  . Sodium 07/05/2017 136  135 - 145 mmol/L Final  . Potassium 07/05/2017 3.5  3.5 - 5.1 mmol/L Final  . Chloride 07/05/2017 96* 101 - 111 mmol/L Final  . CO2 07/05/2017 31  22 - 32 mmol/L Final  . Glucose, Bld 07/05/2017 103* 65 - 99 mg/dL Final  . BUN 07/05/2017 <5* 6 - 20 mg/dL Final  . Creatinine, Ser 07/05/2017 0.84  0.61 - 1.24 mg/dL Final  . Calcium 07/05/2017 9.5  8.9 - 10.3 mg/dL Final  . Total Protein 07/05/2017 7.5  6.5 - 8.1 g/dL Final  . Albumin 07/05/2017 4.1  3.5 - 5.0 g/dL Final  . AST 07/05/2017 21  15 - 41 U/L Final  . ALT 07/05/2017 15* 17 - 63 U/L Final  . Alkaline Phosphatase 07/05/2017 86  38 - 126 U/L Final  . Total Bilirubin 07/05/2017 1.0  0.3 - 1.2 mg/dL Final  . GFR calc non Af Amer 07/05/2017 >60  >60 mL/min Final  . GFR calc Af Amer 07/05/2017 >60  >60 mL/min Final   Comment: (NOTE) The eGFR has been calculated using the CKD EPI equation. This calculation has not been validated in all clinical situations. eGFR's persistently <60 mL/min signify possible Chronic Kidney Disease.   . Anion gap 07/05/2017 9  5 - 15 Final  . LDH 07/05/2017 213* 98 - 192 U/L Final   SLIGHT HEMOLYSIS  . Smear Review 07/05/2017 SMEAR STAINED AND AVAILABLE FOR REVIEW   Final  . Prothrombin Time 07/05/2017 13.4  11.4 - 15.2 seconds Final  . INR 07/05/2017 1.02   Final  . aPTT 07/05/2017 35  24 - 36  seconds Final  . HIV Screen 4th Generation wRfx 07/05/2017 Non Reactive  Non Reactive Final   Comment: (NOTE) Performed At: [facility] [place], [place] [id number] Lindon Romp MD QI:[id number]   . Sodium  07/06/2017 135  135 - 145 mmol/L Final  . Potassium 07/06/2017 4.0  3.5 - 5.1 mmol/L Final  . Chloride 07/06/2017 97* 101 - 111 mmol/L Final  . CO2 07/06/2017 30  22 - 32 mmol/L Final  . Glucose, Bld 07/06/2017 156* 65 - 99 mg/dL Final  . BUN 07/06/2017 6  6 - 20 mg/dL Final  . Creatinine, Ser 07/06/2017 0.90  0.61 - 1.24 mg/dL Final  . Calcium 07/06/2017 9.2  8.9 - 10.3 mg/dL Final  . GFR calc non Af Amer 07/06/2017 >60  >60 mL/min Final  . GFR calc Af Amer 07/06/2017 >60  >60 mL/min Final   Comment: (NOTE) The eGFR has been calculated using the CKD EPI equation. This calculation has not been validated in all clinical situations. eGFR's persistently <60 mL/min signify possible Chronic Kidney Disease.   . Anion gap 07/06/2017 8  5 - 15 Final  . WBC 07/06/2017 4.3  4.0 - 10.5 K/uL Final  . RBC 07/06/2017 4.51  4.22 - 5.81 MIL/uL Final  . Hemoglobin 07/06/2017 14.4  13.0 - 17.0 g/dL Final  . HCT 07/06/2017 44.0  39.0 - 52.0 % Final  . MCV 07/06/2017 97.6  78.0 - 100.0 fL Final  . MCH 07/06/2017 31.9  26.0 - 34.0 pg Final  . MCHC 07/06/2017 32.7  30.0 - 36.0 g/dL Final  . RDW 07/06/2017 14.1  11.5 - 15.5 % Final  . Platelets 07/06/2017 297  150 - 400 K/uL Final  . Uric Acid, Serum 07/06/2017 6.1  4.4 - 7.6 mg/dL Final  . Phosphorus 07/06/2017 3.0  2.5 - 4.6 mg/dL Final  . WBC 07/08/2017 9.8  4.0 - 10.5 K/uL Final  . RBC 07/08/2017 4.74  4.22 - 5.81 MIL/uL Final  . Hemoglobin 07/08/2017 15.5  13.0 - 17.0 g/dL Final  . HCT 07/08/2017 45.9  39.0 - 52.0 % Final  . MCV 07/08/2017 96.8  78.0 - 100.0 fL Final  . MCH 07/08/2017 32.7  26.0 - 34.0 pg Final  . MCHC 07/08/2017 33.8  30.0 - 36.0 g/dL Final  . RDW 07/08/2017 13.9  11.5 - 15.5 % Final  . Platelets 07/08/2017 317  150 - 400 K/uL Final  . Sodium 07/08/2017 134* 135 - 145 mmol/L Final  . Potassium 07/08/2017 3.7  3.5 - 5.1 mmol/L Final  . Chloride 07/08/2017 97* 101 - 111 mmol/L Final  . CO2 07/08/2017 29  22 - 32 mmol/L Final  .  Glucose, Bld 07/08/2017 109* 65 - 99 mg/dL Final  . BUN 07/08/2017 7  6 - 20 mg/dL Final  . Creatinine, Ser 07/08/2017 0.91  0.61 - 1.24 mg/dL Final  . Calcium 07/08/2017 9.4  8.9 - 10.3 mg/dL Final  . GFR calc non Af Amer 07/08/2017 >60  >60 mL/min Final  . GFR calc Af Amer 07/08/2017 >60  >60 mL/min Final   Comment: (NOTE) The eGFR has been calculated using the CKD EPI equation. This calculation has not been validated in all clinical situations. eGFR's persistently <60 mL/min signify possible Chronic Kidney Disease.   . Anion gap 07/08/2017 8  5 - 15 Final  . Magnesium 07/08/2017 2.1  1.7 - 2.4 mg/dL Final  . Hgb A1c MFr Bld 07/08/2017 4.9  4.8 - 5.6 %  Final   Comment: (NOTE)         Pre-diabetes: 5.7 - 6.4         Diabetes: >6.4         Glycemic control for adults with diabetes: <7.0   . Mean Plasma Glucose 07/08/2017 94  mg/dL Final   Comment: (NOTE) Performed At: [facility] [place], [place] [id number] Lindon Romp MD QJ:[id number]        Ardath Sax, MD

## 2017-07-13 NOTE — Patient Instructions (Addendum)
I have prescribed you Arnuity Ellipta, 2 puffs once daily for maintenance therapy of shortness of breath.  For acute shortness of breath, you may use Albuterol inhaler, 2 puffs, every 4 hours as needed.   Amlodipine  5 mg at bedtime once daily for blood pressure control. Continue taking metoprolol as prescribed.  Return for evaluation of treatment in 2 weeks.

## 2017-07-13 NOTE — Telephone Encounter (Signed)
sw pt to confirm 7/31 appt at 330 per sch msg

## 2017-07-13 NOTE — Progress Notes (Signed)
Patient ID: ACESON LABELL, male    DOB: 04/09/1956, 61 y.o.   MRN: [id number]  PCP: [name], FNP  Chief Complaint  Patient presents with  . Hospitalization Follow-up  . Establish Care    Subjective:  HPI Levi Brown is a 61 y.o. male presents to establish care and for hospital follow-up. Fannie presented to [facility] ED on 07/05/2017 with a complaint of shortness of breath, hemoptysis, and hoarseness persisting for over 6 weeks. Prior to presenting to the emergency department, Ira was seen at [facility] in which he was referred to the ED for chest x-ray that was suspicious for malignancy. During hospitalization, CT of chest was significant for lung masses. (See Impression results below). He later underwent bronchoscopy which lung biopsy pathology was significant for "poorly differentiated squamous cell carcinoma". He subsequently underwent a brain CT which confirmed prior infarcts, although patient denies any prior history or symptoms significant for CVA.  Today, Elick continues to reports persistent shortness of breath, cough, and chest tightness. He is a former smoker and smoked an average of 1 pack per day of cigarettes for more than 30 years. Reports that he stopped smoking about 1 year prior.  Drinks beers regularly, otherwise denies any other chronic alcohol use. He has an upcoming appointment today with oncology, Dr. Grace Isaac for further evaluation of staging and treatment recommendations. Lena reports a prior history of hypertension, however endorses that he discontinue medications sometime ago as he is uninsured.   Social History   Social History  . Marital status: Single    Spouse name: N/A  . Number of children: N/A  . Years of education: N/A   Occupational History  . Not on file.   Social History Main Topics  . Smoking status: Former Smoker    Packs/day: 1.00    Years: 30.00    Types: Cigarettes    Quit date: 2017  . Smokeless  tobacco: Never Used     Comment: quit smoking in 2017  . Alcohol use 8.4 - 12.6 oz/week    14 - 21 Cans of beer per week  . Drug use: No  . Sexual activity: Not on file   Other Topics Concern  . Not on file   Social History Narrative  . No narrative on file    Family History  Problem Relation Age of Onset  . Asthma Mother   . COPD Mother   . Cancer Neg Hx    Review of Systems See HPI Patient Active Problem List   Diagnosis Date Noted  . 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 Elevated blood pressure reading 07/05/2017    No Known Allergies  Prior to Admission medications   Medication Sig Start Date End Date Taking? Authorizing Provider  aspirin EC 81 MG tablet Take 1 tablet (81 mg total) by mouth daily. 07/08/17 07/08/18 Yes Florencia Reasons, MD  metoprolol tartrate (LOPRESSOR) 25 MG tablet Take 0.5 tablets (12.5 mg total) by mouth 2 (two) times daily. 07/08/17  Yes Florencia Reasons, MD  acetaminophen (TYLENOL) 325 MG tablet Take 325 mg by mouth every 6 (six) hours as needed for moderate pain.    [provider]  Dextromethorphan Polistirex (COUGH DM PO) Take 30 mLs by mouth daily as needed (cough).    [provider]  Multiple Vitamins-Minerals (DRY EYE FORMULA PO) Place 1 drop into both eyes daily.    [provider]    Past Medical, Surgical Family and Social History  reviewed and updated.    Objective:   Today's Vitals   07/13/17 0835  BP: (!) 150/80  Pulse: 91  Resp: 16  Temp: 98 F (36.7 C)  TempSrc: Oral  SpO2: 99%  Weight: 163 lb 9.6 oz (74.2 kg)  Height: 5\' 6"  (1.676 m)   Wt Readings from Last 3 Encounters:  07/13/17 163 lb 9.6 oz (74.2 kg)  07/08/17 159 lb 12.8 oz (72.5 kg)   Physical Exam  Constitutional: He is oriented to person, place, and time. He appears well-developed and well-nourished.  HENT:  Head: Normocephalic and atraumatic.  Eyes: Pupils are equal, round, and reactive to light. Conjunctivae and EOM are normal.  Neck: Normal range  of motion. Neck supple.  Cardiovascular: Normal rate, regular rhythm, normal heart sounds and intact distal pulses.   Pulmonary/Chest: No respiratory distress. He has wheezes. He exhibits tenderness.  Abdominal: Soft. Bowel sounds are normal.  Musculoskeletal: Normal range of motion.  Neurological: He is alert and oriented to person, place, and time.  Psychiatric: He has a normal mood and affect. His behavior is normal. Thought content normal.  Ct Chest W Contrast Result Date: 07/05/2017 IMPRESSION: 1. 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 There is also mass effect and significant narrowing of the left main pulmonary artery. Differential considerations include lymphoma, thymic malignancy, and potentially small cell lung cancer. No evidence of distant metastatic disease. 2. Complete collapse of the left upper lobe. It is difficult to identify where the primary mediastinal mass ends and atelectatic lung begins. Direct invasion into the lung is not excluded. 3. 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 Electronically Signed   By: Jacqulynn Cadet M.D.   On: 07/05/2017 20:58   Ct Abdomen Pelvis W Contrast Result Date: 07/06/2017  IMPRESSION: 1. No CT evidence for metastatic disease to the abdomen or pelvis. 2. 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 Electronically Signed   By: Donavan Foil M.D.   On: 07/06/2017 22:17    Mr Jeri Cos GQ Contrast  IMPRESSION: 1. No acute intracranial process. No evidence for intracranial metastatic disease. 2. Mild to moderate chronic small vessel ischemic disease with remote lacunar infarcts involving the left basal ganglia, left thalamus, and pons. Electronically Signed   By: Jeannine Boga M.D.   On: 07/07/2017 20:47   Assessment &  Plan:  1. Shortness of breath - albuterol (PROVENTIL) (2.5 MG/3ML) 0.083% nebulizer solution 2.5 mg; Take 3 mLs (2.5 mg total) by nebulization once. - ipratropium (ATROVENT) nebulizer solution 0.5 mg; Take 2.5 mLs (0.5 mg total) by nebulization once. -Will trial patient on Albuterol, 2 puffs, every 4 hours as needed for shortness of breath and Arnuity Ellipta 2 puffs once per day for maintenance of shortness of breath.  2. Essential hypertension, uncontrolled  -Continue metoprolol 12.5 mg twice daily . -Adding Amlodipine 5 mg once daily.  3. Mediastinal mass, per pathology report from recent lung biopsy confirms squamous cell lung cancer -Continue follow-up and recommendation by oncology.    RTC: 2 weeks to evaluate effectiveness of treatment.  Carroll Sage. Kenton Kingfisher, MSN, FNP-C [facility]  [address] [place], [place] [address] [phone]

## 2017-07-13 NOTE — Progress Notes (Addendum)
START ON PATHWAY REGIMEN - Non-Small Cell Lung     A cycle is every 28 days:     Cisplatin      Etoposide   **Always confirm dose/schedule in your pharmacy ordering system**    Patient Characteristics: Stage III - Unresectable, PS = 0, 1 AJCC T Category: T4 Current Disease Status: No Distant Mets or Local Recurrence AJCC N Category: N2 AJCC M Category: M0 AJCC 8 Stage Grouping: IIIB Performance Status: PS = 0, 1 Intent of Therapy: Curative Intent, Discussed with Patient

## 2017-07-13 NOTE — Assessment & Plan Note (Signed)
61 year old male with minimal precedent comorbidities who initially presented with shortness of breath and voice hoarseness and was found to have a large mass in the left mediastinum extending into the left lung. Dating imaging with CT of the abdomen and pelvis and MRI of the brain revealed no metastatic disease elsewhere. 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  Based on the current extent of the disease, I believe patient has a T4 N2 M0 disease at least consistent with stage IIIB lung cancer. This is advanced disease with small chance of curative intent therapy. Clear differentiation from stage IV disease as necessary and I do recommend additional assessment with a PET/CT. If additional disease is not discovered, I suggested proceeding with combined chemoradiotherapy. Patient is young and healthy and is likely to be able to tolerate combination of cisplatin and etoposide administered concurrently with radiation if he is a candidate for the latter.   Plan: --PET-CT --Consult Radiation Oncology for possible radiotherapy --Consult IR for infusaport placement --Chemo-Ed and possible Cisplatin/etoposide on RTC with [place].  Voice recognition software was used and creation of this note. Despite my best effort at editing the text, some misspelling/errors may have occurred.

## 2017-07-14 ENCOUNTER — Ambulatory Visit: Payer: Self-pay | Admitting: Hematology and Oncology

## 2017-07-14 LAB — POCT URINALYSIS DIP (DEVICE)
Bilirubin Urine: NEGATIVE
GLUCOSE, UA: NEGATIVE mg/dL
Ketones, ur: NEGATIVE mg/dL
Leukocytes, UA: NEGATIVE
Nitrite: NEGATIVE
PROTEIN: NEGATIVE mg/dL
SPECIFIC GRAVITY, URINE: 1.015 (ref 1.005–1.030)
UROBILINOGEN UA: 0.2 mg/dL (ref 0.0–1.0)
pH: 5.5 (ref 5.0–8.0)

## 2017-07-15 ENCOUNTER — Encounter: Payer: Self-pay | Admitting: Hematology and Oncology

## 2017-07-15 ENCOUNTER — Encounter: Payer: Self-pay | Admitting: Radiation Oncology

## 2017-07-15 NOTE — Progress Notes (Signed)
Reviewed notes for uninsured patient advised by Dawna Part. Patient had recent hospital admission and therefore would have been seen and screened for financial assistance at that time. I was not able to access these notes and reached out to a counselor(Sharon) on the hospital side to ask what had taken place. She advised that patient states he was going back to work and disability was not determined and case was dropped from their list. She will send message back to hospital side provided the diagnosis now and fact patient will be doing treatment. I will follow up with patient on 07/28/17 to give time for them to reach out and do assessment.

## 2017-07-22 ENCOUNTER — Encounter: Payer: Self-pay | Admitting: Pharmacy Technician

## 2017-07-22 ENCOUNTER — Telehealth: Payer: Self-pay | Admitting: Hematology and Oncology

## 2017-07-22 NOTE — Telephone Encounter (Signed)
R/s appt per sch message from [place] - left message with appt change and put note in next appt

## 2017-07-22 NOTE — Progress Notes (Signed)
Thoracic Location of Tumor / Histology: Squamous Cell Carcinoma of Lung    Patient states that the hoarseness of the voice started at the end of May 2018.  Subsequently developed progressive dyspnea with exertion, cough and hemoptysis since mid June.     Biopsies of left upper lung 07/07/2017   Tobacco/Marijuana/Snuff/ETOH use: former smoker 1 ppd quit 2017.  ETOH 14-21 cans of beer per week.  Reports he has stopped drinking.  Past/Anticipated interventions by cardiothoracic surgery, if any: Procedure: VIDEO BRONCHOSCOPY WITHOUT FLUORO;  Surgeon: Collene Gobble, MD;  Location: [facility] ENDOSCOPY;  Service: Cardiopulmonary;  Laterality: Bilateral;  Past/Anticipated interventions by medical oncology, if any: Dr. Lebron Conners Cisplatin/Etoposide plan to start 07/27/2017  Signs/Symptoms  Weight changes, if any: None  Respiratory complaints, if any: Reports occasional cough, episodes of dyspnea, and shortness of breath with exertion. Denies pain associated with swallowing.  Hemoptysis, if any: Yes  Pain issues, if any:  no  SAFETY ISSUES:  Prior radiation? No  Pacemaker/ICD? No  Possible current pregnancy? No  Is the patient on methotrexate? No  Current Complaints / other details:  61 year old male. Accompanied by sister today. Supplementing diet with Ensure. Reports episodes of dizziness. Reports he must sit straight up to sleep. Reports feeling exhausted and weak. Reports he has taken a leave of absence from work. Reports he has never been sick before in the past. Has four sisters and one brother that are all healthy. "Spot was found on mother's breast when she was 82."

## 2017-07-22 NOTE — Progress Notes (Signed)
I will meet with patient at Chemo Ed 8/14 to sign drug assist application for Emend and possible Neulasta.

## 2017-07-26 ENCOUNTER — Other Ambulatory Visit: Payer: Self-pay

## 2017-07-26 ENCOUNTER — Encounter: Payer: Self-pay | Admitting: Radiation Oncology

## 2017-07-26 ENCOUNTER — Ambulatory Visit
Admission: RE | Admit: 2017-07-26 | Discharge: 2017-07-26 | Disposition: A | Discharge status: Home / Self Care | Payer: Medicaid Other | Source: Ambulatory Visit | Attending: Radiation Oncology | Admitting: Radiation Oncology

## 2017-07-26 ENCOUNTER — Other Ambulatory Visit: Payer: Self-pay | Admitting: Radiology

## 2017-07-26 VITALS — BP 149/96 | HR 123 | Temp 100.8°F | Resp 18 | Ht 62.0 in | Wt 162.0 lb

## 2017-07-26 DIAGNOSIS — R222 Localized swelling, mass and lump, trunk: Secondary | ICD-10-CM | POA: Insufficient documentation

## 2017-07-26 DIAGNOSIS — Z9889 Other specified postprocedural states: Secondary | ICD-10-CM | POA: Diagnosis not present

## 2017-07-26 DIAGNOSIS — C3412 Malignant neoplasm of upper lobe, left bronchus or lung: Secondary | ICD-10-CM | POA: Insufficient documentation

## 2017-07-26 DIAGNOSIS — Z79899 Other long term (current) drug therapy: Secondary | ICD-10-CM | POA: Insufficient documentation

## 2017-07-26 DIAGNOSIS — C3492 Malignant neoplasm of unspecified part of left bronchus or lung: Secondary | ICD-10-CM

## 2017-07-26 DIAGNOSIS — Z510 Encounter for antineoplastic radiation therapy: Secondary | ICD-10-CM | POA: Diagnosis not present

## 2017-07-26 DIAGNOSIS — Q278 Other specified congenital malformations of peripheral vascular system: Secondary | ICD-10-CM | POA: Insufficient documentation

## 2017-07-26 DIAGNOSIS — Q2547 Right aortic arch: Secondary | ICD-10-CM | POA: Insufficient documentation

## 2017-07-26 DIAGNOSIS — J9811 Atelectasis: Secondary | ICD-10-CM | POA: Diagnosis not present

## 2017-07-26 DIAGNOSIS — Z87891 Personal history of nicotine dependence: Secondary | ICD-10-CM | POA: Insufficient documentation

## 2017-07-26 DIAGNOSIS — R490 Dysphonia: Secondary | ICD-10-CM | POA: Diagnosis not present

## 2017-07-26 DIAGNOSIS — Z825 Family history of asthma and other chronic lower respiratory diseases: Secondary | ICD-10-CM | POA: Diagnosis not present

## 2017-07-26 DIAGNOSIS — R000 Tachycardia, unspecified: Secondary | ICD-10-CM | POA: Diagnosis not present

## 2017-07-26 DIAGNOSIS — Z7982 Long term (current) use of aspirin: Secondary | ICD-10-CM | POA: Diagnosis not present

## 2017-07-26 DIAGNOSIS — Z809 Family history of malignant neoplasm, unspecified: Secondary | ICD-10-CM | POA: Diagnosis not present

## 2017-07-26 NOTE — Progress Notes (Addendum)
[place]         [phone] ________________________________  Initial outpatient Consultation  Name: Levi Brown MRN: [id number]  Date: 07/26/2017  DOB: 1956-10-07  REFERRING PHYSICIAN: Ardath Sax, MD  DIAGNOSIS: 61 y.o. gentleman with Stage IIIB, T4N2M0, NSCLC, squamous cell carcinoma of the left upper lobe    ICD-10-CM   1. Squamous cell carcinoma of bronchus in left upper lobe (HCC) C34.12   2. Primary cancer of left upper lobe of lung ([place]) C34.12     HISTORY OF PRESENT ILLNESS:Levi Brown is a 61 y.o. male who presents today with squamous cell carcinoma of lung. He presented last month after noting hoarseness and shortness of breath and presented to the ED on 07/05/17. A CT of the chest on 07/05/2017 revealed an amorphous 6.6 x 6.5 x 5.2 cm mass centered in the left mediastinum which completely obliterates the left upper lobe bronchus and impinges into the left lower lobe bronchus, There is also mass effect and significant narrowing of the left main pulmonary artery, and complete collapse of the left upper lobe. On 07/06/2017, CT of the abdomen and pelvis revealed no metastatic disease to the abdomen, pelvis and descending thoracic aorta to the right of the spine, corresponding to the patient's right arch. Also an MRI of the brain revealed no evidence of intracranial metastatic disease. He continued his workup as an inpatient and a bronchoscopic biopsy of the left upper lung on 07/07/2017 revealed a poorly differentiated squamous cell carcinoma. He was discharged home and has plans to proceed with a  PET on 07/29/2017. Of note, he quit smoking in 2016. He is scheduled for chemo education tomorrow, and for port placement Wednesday.   On review of systems, the patient reports that he is doing well overall. He denies any chest pain, shortness of breath, cough, fevers, chills, night sweats, unintended weight changes. He denies any bowel or bladder  disturbances, and denies abdominal pain, nausea or vomiting. He denies any new musculoskeletal or joint aches or pains, new skin lesions or concerns. A complete review of systems is obtained and is otherwise negative.   PREVIOUS RADIATION THERAPY: No   Past Medical History:  Past Medical History:  Diagnosis Date  . Former smoker    Quit smoking in early 2017  . Non-small cell cancer of left lung ([place]) 07/13/2017    Past Surgical History: Past Surgical History:  Procedure Laterality Date  . VIDEO BRONCHOSCOPY Bilateral 07/07/2017   Procedure: VIDEO BRONCHOSCOPY WITHOUT FLUORO;  Surgeon: Collene Gobble, MD;  Location: [facility] ENDOSCOPY;  Service: Cardiopulmonary;  Laterality: Bilateral;    Social History:  Social History   Social History  . Marital status: Single    Spouse name: N/A  . Number of children: N/A  . Years of education: N/A   Occupational History  . Not on file.   Social History Main Topics  . Smoking status: Former Smoker    Packs/day: 1.00    Years: 30.00    Types: Cigarettes    Quit date: 2017  . Smokeless tobacco: Never Used     Comment: quit smoking in 2017  . Alcohol use 8.4 - 12.6 oz/week    14 - 21 Cans of beer per week  . Drug use: No  . Sexual activity: No   Other Topics Concern  . Not on file   Social History Narrative  . No narrative on file    Family History: Family History  Problem Relation  Age of Onset  . Asthma Mother   . COPD Mother   . Cancer Mother        breast?   Medications:  Current Outpatient Prescriptions:  .  albuterol (PROVENTIL HFA;VENTOLIN HFA) 108 (90 Base) MCG/ACT inhaler, Inhale 2 puffs into the lungs every 4 (four) hours as needed for wheezing or shortness of breath (cough, shortness of breath or wheezing.)., Disp: 1 Inhaler, Rfl: 1 .  amLODipine (NORVASC) 5 MG tablet, Take 1 tablet (5 mg total) by mouth daily., Disp: 90 tablet, Rfl: 3 .  aspirin EC 81 MG tablet, Take 1 tablet (81 mg total) by mouth daily., Disp: 30  tablet, Rfl: 0 .  Fluticasone Furoate (ARNUITY ELLIPTA) 100 MCG/ACT AEPB, Inhale 2 puffs into the lungs daily., Disp: 30 each, Rfl: 11 .  metoprolol tartrate (LOPRESSOR) 25 MG tablet, Take 0.5 tablets (12.5 mg total) by mouth 2 (two) times daily., Disp: 60 tablet, Rfl: 3 .  Multiple Vitamins-Minerals (DRY EYE FORMULA PO), Place 1 drop into both eyes daily., Disp: , Rfl:  .  acetaminophen (TYLENOL) 325 MG tablet, Take 325 mg by mouth every 6 (six) hours as needed for moderate pain., Disp: , Rfl:  .  Dextromethorphan Polistirex (COUGH DM PO), Take 30 mLs by mouth daily as needed (cough)., Disp: , Rfl:     Allergies: No Known Allergies   PHYSICAL EXAM:  Blood pressure (!) 149/96, pulse (!) 123, temperature (!) 100.8 F (38.2 C), temperature source Oral, resp. rate 18, height 5\' 2"  (1.575 m), weight 162 lb (73.5 kg), SpO2 90 %.  Pain Assessment Pain Score: 0-No pain/10  In general this is a well appearing African American male in no acute distress. He is alert and oriented x4 and appropriate throughout the examination. HEENT reveals that the patient is normocephalic, atraumatic. EOMs are intact. PERRLA. Skin is intact without any evidence of gross lesions. Cardiovascular exam reveals a tachycardic rate and regular rhythm, no clicks rubs or murmurs are auscultated. Chest is clear to auscultation bilaterally. Lymphatic assessment is performed and does not reveal any adenopathy in the cervical, supraclavicular, axillary, or inguinal chains. Abdomen has active bowel sounds in all quadrants and is intact. The abdomen is soft, non tender, non distended. Lower extremities are negative for pretibial pitting edema, deep calf tenderness, cyanosis or clubbing.   KPS = 80 100 - Normal; no complaints; no evidence of disease. 90   - Able to carry on normal activity; minor signs or symptoms of disease. 80   - Normal activity with effort; some signs or symptoms of disease. 10   - Cares for self; unable to carry  on normal activity or to do active work. 60   - Requires occasional assistance, but is able to care for most of his personal needs. 50   - Requires considerable assistance and frequent medical care. 31   - Disabled; requires special care and assistance. 31   - Severely disabled; hospital admission is indicated although death not imminent. 69   - Very sick; hospital admission necessary; active supportive treatment necessary. 10   - Moribund; fatal processes progressing rapidly. 0     - Dead  Karnofsky DA, Abelmann [place], Craver LS and Burchenal [facility] [phone]) The use of the nitrogen mustards in the palliative treatment of carcinoma: with particular reference to bronchogenic carcinoma Cancer 1 634-56  LABORATORY DATA:  Lab Results  Component Value Date   WBC 9.8 07/08/2017   HGB 15.5 07/08/2017   HCT 45.9 07/08/2017  MCV 96.8 07/08/2017   PLT 317 07/08/2017   Lab Results  Component Value Date   NA 134 (L) 07/08/2017   K 3.7 07/08/2017   CL 97 (L) 07/08/2017   CO2 29 07/08/2017   Lab Results  Component Value Date   ALT 15 (L) 07/05/2017   AST 21 07/05/2017   ALKPHOS 86 07/05/2017   BILITOT 1.0 07/05/2017     RADIOGRAPHY: Dg Chest 2 View  Result Date: 07/05/2017 CLINICAL DATA:  Shortness of breath and wheezing. EXAM: CHEST  2 VIEW COMPARISON:  None. FINDINGS: Marked enlargement of the mediastinum and the patient may even have a right aortic arch. However, the mediastinal tissue is more prominent on the left side. Trachea is midline. Heart size appears to be grossly normal. There is mild elevation of the left hemidiaphragm. Lungs are clear without airspace disease or pulmonary edema. No large pleural effusions. IMPRESSION: Enlargement and abnormal appearance of the mediastinum. Differential diagnosis would include a mediastinal/lung mass, lymphadenopathy and/or thoracic aortic aneurysm. Recommend further characterization with a chest CT with IV contrast. These results will be called to the  ordering clinician or representative by the Radiologist Assistant, and communication documented in the PACS or zVision Dashboard. Electronically Signed   By: Markus Daft M.D.   On: 07/05/2017 13:20   Ct Chest W Contrast  Result Date: 07/05/2017 CLINICAL DATA:  61 year old male with shortness of breath for the past month and hoarseness for the past month and a half EXAM: CT CHEST WITH CONTRAST TECHNIQUE: Multidetector CT imaging of the chest was performed during intravenous contrast administration. CONTRAST:  131mL ISOVUE-300 IOPAMIDOL (ISOVUE-300) INJECTION 61% COMPARISON:  Chest x-ray obtained earlier today FINDINGS: Cardiovascular: Right-sided aortic arch with aberrant left subclavian artery. Diffuse narrowing of the left main pulmonary artery secondary to local mass effect from the mediastinal mass. No evidence of aortic aneurysm. The heart is normal in size. No pericardial effusion. Mediastinum/Nodes: Ill-defined mediastinal mass which is difficult to measure precisely. The mass measures at least 6.5 x 5.2 x 6.6 cm and is contiguous with collapse of the left upper lobe. The mass completely obliterates the left upper lobe bronchus and likely impinges upon the left lower lobe bronchus. No additional mediastinal adenopathy identified. Lungs/Pleura: Complete collapse of the left upper lobe. There is no aeration within the left upper lobe. Secondary hyperinflation of the left lower lobe. The aerated portion of the lungs are clear. No pulmonary mass or nodules. Upper Abdomen: Visualized upper abdominal organs are unremarkable. Musculoskeletal: No acute fracture or aggressive appearing lytic or blastic osseous lesion. IMPRESSION: 1. 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 There is also mass effect and significant narrowing of the left main pulmonary artery.  Differential considerations include lymphoma, thymic malignancy, and potentially small cell lung cancer. No evidence of distant metastatic disease. 2. Complete collapse of the left upper lobe. It is difficult to identify where the primary mediastinal mass ends and atelectatic lung begins. Direct invasion into the lung is not excluded. 3. 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 Electronically Signed   By: Jacqulynn Cadet M.D.   On: 07/05/2017 20:58   Mr Jeri Cos DV Contrast  Result Date: 07/07/2017 CLINICAL DATA:  Initial evaluation for cancer staging. EXAM: MRI HEAD WITHOUT AND WITH CONTRAST TECHNIQUE: Multiplanar, multiecho pulse sequences of the brain and surrounding structures were obtained  without and with intravenous contrast. CONTRAST:  15 cc of MultiHance. COMPARISON:  None available. FINDINGS: Brain: Generalized age related cerebral volume loss present. Patchy T2/FLAIR hyperintensity within the periventricular and deep white matter both cerebral hemispheres, most consistent with chronic small vessel ischemic disease, mild to moderate nature. Superimposed remote lacunar infarcts present within the left basal ganglia and left thalamus. Few scatter remote lacunar infarct present within the pons as well. No abnormal foci of restricted diffusion to suggest acute or subacute ischemia. Gray-white matter differentiation maintained. No remote cortical infarction. No evidence for acute or chronic intracranial hemorrhage. No mass lesion, midline shift or mass effect. No hydrocephalus. No extra-axial fluid collection. Major dural sinuses are patent. No abnormal enhancement. No evidence for intracranial metastatic disease. Pituitary gland and suprasellar region within normal limits. Midline structures intact and normal. Vascular: Major intracranial vascular flow voids are maintained. Skull and upper cervical spine: Craniocervical junction within normal limits. Visualized upper cervical spine  unremarkable. Bone marrow signal intensity within normal limits. No scalp soft tissue abnormality. Sinuses/Orbits: Globes and orbital soft tissues within normal limits. Scattered mucosal thickening with retention cyst present within the ethmoidal air cells and maxillary sinuses. Paranasal sinuses are otherwise clear. Trace left mastoid effusion, of doubtful significance. Inner ear structures normal. IMPRESSION: 1. No acute intracranial process. No evidence for intracranial metastatic disease. 2. Mild to moderate chronic small vessel ischemic disease with remote lacunar infarcts involving the left basal ganglia, left thalamus, and pons. Electronically Signed   By: Jeannine Boga M.D.   On: 07/07/2017 20:47   Ct Abdomen Pelvis W Contrast  Result Date: 07/06/2017 CLINICAL DATA:  Mediastinal mass EXAM: CT ABDOMEN AND PELVIS WITH CONTRAST TECHNIQUE: Multidetector CT imaging of the abdomen and pelvis was performed using the standard protocol following bolus administration of intravenous contrast. CONTRAST:  100 mL Isovue 300 intravenous COMPARISON:  CT chest 07/05/2017 FINDINGS: Lower chest: Lung bases demonstrate no acute consolidation or pleural effusion. Heart size upper normal. Patient has history of right-sided aortic arch, and the descending thoracic aorta is seen to the right of the spine. Hepatobiliary: No focal liver abnormality is seen. No gallstones, gallbladder wall thickening, or biliary dilatation. Pancreas: Unremarkable. No pancreatic ductal dilatation or surrounding inflammatory changes. Spleen: Normal in size without focal abnormality. Adrenals/Urinary Tract: Adrenal glands are unremarkable. Kidneys are normal, without renal calculi, focal lesion, or hydronephrosis. Bladder is unremarkable. Stomach/Bowel: Stomach is within normal limits. Appendix appears normal. No evidence of bowel wall thickening, distention, or inflammatory changes. Vascular/Lymphatic: Aortic atherosclerosis. No enlarged  abdominal or pelvic lymph nodes. Reproductive: Prominent peripheral calcification in the prostate. Other: No free air or free fluid. Musculoskeletal: No acute or suspicious bone lesion. Degenerative changes. IMPRESSION: 1. No CT evidence for metastatic disease to the abdomen or pelvis. 2. 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 Electronically Signed   By: Donavan Foil M.D.   On: 07/06/2017 22:17      IMPRESSION/PLAN:  1.  61 y.o. gentleman with Stage IIIB, T4N2M0, NSCLC, squamous cell carcinoma of the left upper lobe. We reviewed the findings from his imaging and biopsies. He discusses with the patient the options for concurrent chemotherapy and radiotherapy provided his PET scan is negative. We discussed the risks, benefits, short, and long term effects of radiotherapy. He is interested in proceeding. We also outlined the role of radiotherapy over 6 1/2 weeks. We will proceed with simulation today with the hopes of starting Thursday of this week.  Written consent is obtained and placed in the chart, a copy was provided to the patient. 2. Tachycardia and low grade temp. The patient will continue to follow temperatures at home. If he starts noting a productive cough he will let us know at which time we would reassess him to determine if there is a role for antibiotics. He will also try to increase his oral intake of fluids.      Carola Rhine, PAC and   Tyler Pita, MD [facility] Medical Director and Director of Stereotactic Radiosurgery Direct Dial: [phone]  Fax: [phone] [email or web address]  Skype  [email or web address]   This document serves as a record of services personally performed by Shona Simpson, PA-C and Tyler Pita, MD. It was created on their behalf by Valeta Harms, a trained medical scribe. The creation of this record is based on the scribe's personal observations and the providers' statements to  them. This document has been checked and approved by the attending provider.      l

## 2017-07-26 NOTE — Progress Notes (Signed)
See progress note under physician encounter. 

## 2017-07-26 NOTE — Progress Notes (Signed)
  Radiation Oncology         ([phone]) [phone] ________________________________  Name: Levi Brown MRN: [id number]  Date: 07/26/2017  DOB: [birth year]  SIMULATION AND TREATMENT PLANNING NOTE    ICD-10-CM   1. Non-small cell cancer of left lung (HCC) C34.92     DIAGNOSIS:  61 yo man with stage cT4 cN2 cM0, pending PET, squamous cell carcinoma of the left upper lung  NARRATIVE:  The patient was brought to the [facility].  Identity was confirmed.  All relevant records and images related to the planned course of therapy were reviewed.  The patient freely provided informed written consent to proceed with treatment after reviewing the details related to the planned course of therapy. The consent form was witnessed and verified by the simulation staff.  Then, the patient was set-up in a stable reproducible  supine position for radiation therapy.  CT images were obtained.  Surface markings were placed.  The CT images were loaded into the planning software.  Then the target and avoidance structures were contoured.  Treatment planning then occurred.  The radiation prescription was entered and confirmed.  Then, I designed and supervised the construction of a total of 6 medically necessary complex treatment devices, including a BodyFix immobilization mold custom fitted to the patient along with 5 multileaf collimators conformally shaped radiation around the treatment target while shielding critical structures such as the heart and spinal cord maximally.  I have requested : 3D Simulation  I have requested a DVH of the following structures: Left lung, right lung, spinal cord, heart, esophagus, and target.  I have ordered:Nutrition Consult  SPECIAL TREATMENT PROCEDURE:  The planned course of therapy using radiation constitutes a special treatment procedure. Special care is required in the management of this patient for the following reasons.  The patient will be receiving concurrent chemotherapy  requiring careful monitoring for increased toxicities of treatment including periodic laboratory values.  The special nature of the planned course of radiotherapy will require increased physician supervision and oversight to ensure patient's safety with optimal treatment outcomes.  PLAN:  The patient will receive 66 Gy in 33 fractions.  ________________________________  Sheral Apley Tammi Klippel, M.D.

## 2017-07-27 ENCOUNTER — Telehealth: Payer: Self-pay | Admitting: Family Medicine

## 2017-07-27 ENCOUNTER — Ambulatory Visit (INDEPENDENT_AMBULATORY_CARE_PROVIDER_SITE_OTHER): Payer: Self-pay | Admitting: Family Medicine

## 2017-07-27 ENCOUNTER — Ambulatory Visit (HOSPITAL_COMMUNITY): Admission: RE | Admit: 2017-07-27 | Payer: Self-pay | Source: Ambulatory Visit

## 2017-07-27 ENCOUNTER — Encounter: Payer: Self-pay | Admitting: Family Medicine

## 2017-07-27 ENCOUNTER — Telehealth: Payer: Self-pay | Admitting: *Deleted

## 2017-07-27 ENCOUNTER — Ambulatory Visit (HOSPITAL_COMMUNITY): Admission: RE | Admit: 2017-07-27 | Payer: MEDICAID | Source: Ambulatory Visit

## 2017-07-27 VITALS — BP 136/78 | HR 100 | Temp 99.3°F | Resp 12 | Ht 62.0 in | Wt 161.0 lb

## 2017-07-27 DIAGNOSIS — R0602 Shortness of breath: Secondary | ICD-10-CM

## 2017-07-27 MED ORDER — METHYLPREDNISOLONE SODIUM SUCC 125 MG IJ SOLR
80.0000 mg | Freq: Once | INTRAMUSCULAR | Status: AC
  Administered 2017-07-27: 80 mg via INTRAMUSCULAR

## 2017-07-27 MED ORDER — METOPROLOL TARTRATE 25 MG PO TABS: 25.0000 mg | ORAL_TABLET | Freq: Two times a day (BID) | ORAL | 3 refills | Status: DC

## 2017-07-27 MED ORDER — IPRATROPIUM BROMIDE 0.02 % IN SOLN
0.5000 mg | Freq: Once | RESPIRATORY_TRACT | Status: AC
  Administered 2017-07-27: 0.5 mg via RESPIRATORY_TRACT

## 2017-07-27 MED ORDER — ALBUTEROL SULFATE (2.5 MG/3ML) 0.083% IN NEBU
2.5000 mg | INHALATION_SOLUTION | Freq: Once | RESPIRATORY_TRACT | Status: AC
  Administered 2017-07-27: 2.5 mg via RESPIRATORY_TRACT

## 2017-07-27 MED ORDER — HYDROCODONE-HOMATROPINE 5-1.5 MG/5ML PO SYRP: 5.0000 mL | ORAL_SOLUTION | Freq: Three times a day (TID) | ORAL | 0 refills | Status: DC | PRN

## 2017-07-27 NOTE — Progress Notes (Signed)
Patient ID: Levi Brown, male    DOB: 08/16/1956, 61 y.o.   MRN: [id number]  PCP: [name], FNP  Chief Complaint  Patient presents with  . Follow-up    SOB    Subjective:  HPI Levi Brown is a 61 y.o. male presents for evaluation of shortness of breath. Medical problems include: Squamous Cell Carcinoma, Mediastinal Mass, and Essential Hypertension Levi Brown reports experiencing worsening shortness of breath and coughing over the last several days. He is here today for follow-up of persistent shortness of breath, post recent diagnosis of lung cancer. Levi Brown is reporting difficulty sleeping gh and breathlessness over the last several days. He is unable to sleep due to the sensation of chocking. He continues to experiencing hoarseness. Levi Brown is reluctant to go to the hospital for further work-up of hypoxia as he is scheduled to begin chemotherapy treatments tomorrow. He admits that he hasn't used his albuterol inhaler as often as prescribed as the medications causes a tingling sensation in his mouth. He doesn't have home oxygen and is currently uninsured.   Social History   Social History  . Marital status: Single    Spouse name: N/A  . Number of children: N/A  . Years of education: N/A   Occupational History  . Not on file.   Social History Main Topics  . Smoking status: Former Smoker    Packs/day: 1.00    Years: 30.00    Types: Cigarettes    Quit date: 2017  . Smokeless tobacco: Never Used     Comment: quit smoking in 2017  . Alcohol use 8.4 - 12.6 oz/week    14 - 21 Cans of beer per week  . Drug use: No  . Sexual activity: No   Other Topics Concern  . Not on file   Social History Narrative  . No narrative on file    Family History  Problem Relation Age of Onset  . Asthma Mother   . COPD Mother   . Cancer Mother        breast?   Review of Systems See HPI  Patient Active Problem List   Diagnosis Date Noted  . Non-small cell cancer of left lung  ([place]) 07/13/2017  . Elevated blood pressure reading 07/05/2017    No Known Allergies  Prior to Admission medications   Medication Sig Start Date End Date Taking? Authorizing Provider  acetaminophen (TYLENOL) 325 MG tablet Take 325 mg by mouth every 6 (six) hours as needed for moderate pain.   Yes [provider]  albuterol (PROVENTIL HFA;VENTOLIN HFA) 108 (90 Base) MCG/ACT inhaler Inhale 2 puffs into the lungs every 4 (four) hours as needed for wheezing or shortness of breath (cough, shortness of breath or wheezing.). 07/13/17  Yes [name], FNP  amLODipine (NORVASC) 5 MG tablet Take 1 tablet (5 mg total) by mouth daily. 07/13/17  Yes [name], FNP  aspirin EC 81 MG tablet Take 1 tablet (81 mg total) by mouth daily. 07/08/17 07/08/18 Yes Florencia Reasons, MD  Dextromethorphan Polistirex (COUGH DM PO) Take 30 mLs by mouth daily as needed (cough).   Yes [provider]  Fluticasone Furoate (ARNUITY ELLIPTA) 100 MCG/ACT AEPB Inhale 2 puffs into the lungs daily. 07/13/17  Yes [name], FNP  metoprolol tartrate (LOPRESSOR) 25 MG tablet Take 0.5 tablets (12.5 mg total) by mouth 2 (two) times daily. 07/13/17  Yes [name], FNP  Multiple Vitamins-Minerals (DRY EYE FORMULA PO) Place 1  drop into both eyes daily.   Yes [provider]    Past Medical, Surgical Family and Social History reviewed and updated.    Objective:   Today's Vitals   07/27/17 0843  BP: 140/68  Pulse: 100  Resp: 12  Temp: 99.3 F (37.4 C)  TempSrc: Oral  SpO2: (!) 80%  Weight: 161 lb (73 kg)  Height: 5\' 2"  (1.575 m)    Wt Readings from Last 3 Encounters:  07/27/17 161 lb (73 kg)  07/26/17 162 lb (73.5 kg)  07/13/17 165 lb 3.2 oz (74.9 kg)   Physical Exam  Constitutional: He is oriented to person, place, and time. He appears well-developed and well-nourished.  HENT:  Head: Normocephalic and atraumatic.  Hoarseness of voice   Eyes: Pupils are equal, round, and  reactive to light. Conjunctivae and EOM are normal.  Cardiovascular: Normal rate, regular rhythm, normal heart sounds and intact distal pulses.   Pulmonary/Chest: He is in respiratory distress. He has wheezes. He exhibits tenderness.  R/L upper lobes positive rhonchi    Neurological: He is alert and oriented to person, place, and time. No cranial nerve deficit. Coordination normal.  Skin: Skin is warm and dry.  Psychiatric: He has a normal mood and affect. His behavior is normal. Judgment and thought content normal.    Assessment & Plan:  1. SOB (shortness of breath) -Administered 2 L of supplemental oxygen via nasal canula, for treatment of hypoxia, SPO2 initially 80% on room air. - albuterol (PROVENTIL) (2.5 MG/3ML) 0.083% nebulizer solution 2.5 mg; Take 3 mLs (2.5 mg total) by nebulization once. - albuterol (PROVENTIL) (2.5 MG/3ML) 0.083% nebulizer solution 2.5 mg; Take 3 mLs (2.5 mg total) by nebulization once. - ipratropium (ATROVENT) nebulizer solution 0.5 mg; Take 2.5 mLs (0.5 mg total) by nebulization once. - ipratropium (ATROVENT) nebulizer solution 0.5 mg; Take 2.5 mLs (0.5 mg total) by nebulization once. - methylPREDNISolone sodium succinate (SOLU-MEDROL) 125 mg/2 mL injection 80 mg; Inject 1.28 mLs (80 mg total) into the muscle once. -Resume albuterol 2 puffs every 4-6 hours as needed for shortness of breath and or wheezing -Continue Arnuity Ellipta, 2 puffs daily  -if you experience any further episodes of respiratory distress, go immediately to the Emergency Department.  -Discussed with patient the likelihood of needing supplemental home oxygen and home nebulizer machine. He is uninsured and I advised that our office will seek out resources in an attempt to get him the aforementioned supplies.    RTC: 4 days for evaluation of respiratory status  A total of 25  minutes spent, greater than 50 % of this time was spent reviewing prior medical history, reviewing medications and  indications of treatment, prior labs and diagnostic tests, discussing current plan of treatment, health promotion, and goals of treatment.   Carroll Sage. Kenton Kingfisher, MSN, FNP-C [facility]  [address] [place],  [address] [phone]

## 2017-07-27 NOTE — Patient Instructions (Addendum)
Increase your metoprolol to 25 mg twice daily for rate of your heart rate.  If you experiencing worsening shortness of breath or skin or lips began to change colors, call EMS immediately.  You should use your albuterol inhaler every 6 hours as needed for shortness of breath and wheezing.   Hypoxia Hypoxia is a condition that happens when there is a lack of oxygen in the body's tissues and organs. When there is not enough oxygen, organs cannot work as they should. This causes serious problems throughout the body and in the brain. What are the causes? This condition may be caused by:  Exposure to high altitude.  A collapsed lung (pneumothorax).  Lung infection (pneumonia).  Lung injury.  Long-term (chronic) lung disease, such as COPD (chronic obstructive pulmonary disease).  Blood collecting in the chest cavity (hemothorax).  Food, saliva, or vomit getting into the airway (aspiration).  Reduced blood flow (ischemia).  Severe blood loss.  Slow or shallow breathing (hypoventilation).  Blood disorders, such as anemia.  Carbon monoxide poisoning.  The heart suddenly stopping (cardiac arrest).  Anesthetic medicines.  Drowning.  Choking.  What are the signs or symptoms? Symptoms of this condition include:  Headache.  Fatigue.  Drowsiness.  Forgetfulness.  Nausea.  Confusion.  Shortness of breath.  Dizziness.  Bluish color of the skin, lips, or nail beds (cyanosis).  Change in consciousness or awareness.  If hypoxia is not treated, it can lead to convulsions, loss of consciousness (coma), or brain damage. How is this diagnosed? This condition may be diagnosed based on:  A physical exam.  Blood tests.  A test that measures how much oxygen is in your blood (pulse oximetry). This is done with a sensor that is placed on your finger, toe, or earlobe.  Chest X-ray.  Tests to check your lung function (pulmonary function tests).  A test to check the  electrical activity of your heart (electrocardiogram, ECG).  You may have other tests to determine the cause of your hypoxia. How is this treated? Treatment for this condition depends on what is causing the hypoxia. You will likely be treated with oxygen therapy. This may be done by giving you oxygen through a face mask or through tubes in your nose. Your health care provider may also recommend other therapies to treat the underlying cause of your hypoxia. Follow these instructions at home:  Take over-the-counter and prescription medicines only as told by your health care provider.  Do not use any products that contain nicotine or tobacco, such as cigarettes and e-cigarettes. If you need help quitting, ask your health care provider.  Avoid secondhand smoke.  Work with your health care provider to manage any chronic conditions you have that may be causing hypoxia, such as COPD.  Keep all follow-up visits as told by your health care provider. This is important. Contact a health care provider if:  You have a fever.  You have trouble breathing, even after treatment.  You become extremely short of breath when you exercise. Get help right away if:  Your shortness of breath gets worse, especially with normal or very little activity.  Your skin, lips, or nail beds have a bluish color.  You become confused or you cannot think properly.  You have chest pain. Summary  Hypoxia is a condition that happens when there is a lack of oxygen in the body's tissues and organs.  If hypoxia is not treated, it can lead to convulsions, loss of consciousness (coma), or brain damage.  Symptoms of hypoxia can include a headache, shortness of breath, confusion, nausea, and a bluish skin color.  Hypoxia has many possible causes, including exposure to high altitude, carbon monoxide poisoning, or other health issues, such as blood disorders or cardiac arrest.  Hypoxia is usually treated with oxygen  therapy. This information is not intended to replace advice given to you by your health care provider. Make sure you discuss any questions you have with your health care provider. Document Released: 01/18/2017 Document Revised: 01/18/2017 Document Reviewed: 01/18/2017 Elsevier Interactive Patient Education  2018 [employer].

## 2017-07-27 NOTE — Telephone Encounter (Signed)
Voicemail:   Male voice, no caller name identified in this message.  "I'm trying to reach the nurse for Dr. Lebron Conners.  My brother had two appointments today.  Please return the call to [phone].  He knew nothing about these appointments. ... This is ridiculous.  If they schedule appointments they need let you know."       *See PCP Telephone and office encounter notes for today.    This nurse noted patient F/U today with primary provider at [facility].  O2 Sat = 80%, Resp = 12,  S.O.B. With hypoxic episodes.  Home oxygen recommended.  No further hospital workup.   Routing call information to collaborative nurse and provider for review.  Further patient communication through collaborative nurse.

## 2017-07-27 NOTE — Telephone Encounter (Signed)
Received messages stating Mr. Levi Brown missed chemo edu class 8/13 and missed PAC placement 8/14.  Attempted to reach out to pt to verify he wants to receive treatment.  No answer, LVM with pt stating it is important to receive chemo edu and pac placement prior to treatment. Call back number provided.  Dr. Lebron Conners notified.

## 2017-07-27 NOTE — Telephone Encounter (Signed)
Please contact the cancer center to inquire as to whether or not Levi Brown has a Education officer, museum or Tourist information centre manager following him there at the [employer] If he does, please contact them to advise I would like to order supplemental home oxygen for patient and nebulizer machine as patient is having hypoxic episodes and on arrival in office sats were 80%. His sats improved to 91% with nebulizer treatments x 2, solumedrol, and 2 L oxygen. With the advance stage of his lung cancer, I anticipated this being a long-term need.  Please let me know what you find out.  Thanks,  Carroll Sage. Kenton Kingfisher, MSN, FNP-C [facility]  [address] [place], [place] [address] [phone]

## 2017-07-28 ENCOUNTER — Telehealth: Payer: Self-pay

## 2017-07-28 ENCOUNTER — Ambulatory Visit: Payer: Medicaid Other

## 2017-07-28 ENCOUNTER — Ambulatory Visit: Payer: Self-pay | Admitting: Hematology and Oncology

## 2017-07-28 ENCOUNTER — Ambulatory Visit: Payer: Self-pay

## 2017-07-28 ENCOUNTER — Telehealth: Payer: Self-pay | Admitting: Hematology and Oncology

## 2017-07-28 ENCOUNTER — Other Ambulatory Visit: Payer: Self-pay

## 2017-07-28 NOTE — Telephone Encounter (Signed)
IR aware of pt need to be scheduled. Potential opening Friday between 1130 and 1500 if able. IR to call pt. Scheduling message sent high priority for patient to be added to list for 0930 Chemo Education on Friday (8/17).

## 2017-07-28 NOTE — Telephone Encounter (Signed)
Spoke with patient regarding changes in his schedule. Printed him out an updated schedule with changed appointment times.

## 2017-07-29 ENCOUNTER — Other Ambulatory Visit: Payer: Self-pay

## 2017-07-29 ENCOUNTER — Encounter (HOSPITAL_COMMUNITY)
Admission: RE | Admit: 2017-07-29 | Discharge: 2017-07-29 | Disposition: A | Discharge status: Home / Self Care | Payer: Medicaid Other | Source: Ambulatory Visit | Attending: Hematology and Oncology | Admitting: Hematology and Oncology

## 2017-07-29 ENCOUNTER — Ambulatory Visit
Admission: RE | Admit: 2017-07-29 | Discharge: 2017-07-29 | Disposition: A | Discharge status: Home / Self Care | Payer: Medicaid Other | Source: Ambulatory Visit | Attending: Radiation Oncology | Admitting: Radiation Oncology

## 2017-07-29 DIAGNOSIS — C3412 Malignant neoplasm of upper lobe, left bronchus or lung: Secondary | ICD-10-CM

## 2017-07-29 DIAGNOSIS — Z510 Encounter for antineoplastic radiation therapy: Secondary | ICD-10-CM | POA: Diagnosis not present

## 2017-07-29 DIAGNOSIS — C3492 Malignant neoplasm of unspecified part of left bronchus or lung: Secondary | ICD-10-CM | POA: Diagnosis present

## 2017-07-29 LAB — GLUCOSE, CAPILLARY: Glucose-Capillary: 82 mg/dL (ref 65–99)

## 2017-07-29 MED ORDER — FLUDEOXYGLUCOSE F - 18 (FDG) INJECTION
14.9800 | Freq: Once | INTRAVENOUS | Status: AC | PRN
  Administered 2017-07-29: 14.98 via INTRAVENOUS

## 2017-07-29 NOTE — Telephone Encounter (Signed)
I called cancer center and left a vm for the social worker to give me a callback regarding machine and supplies for patient

## 2017-07-29 NOTE — Telephone Encounter (Signed)
Spoke with the social worker at [facility] and they stated that they don't handle that type of thing.

## 2017-07-30 ENCOUNTER — Ambulatory Visit
Admission: RE | Admit: 2017-07-30 | Discharge: 2017-07-30 | Disposition: A | Discharge status: Home / Self Care | Payer: Medicaid Other | Source: Ambulatory Visit | Attending: Radiation Oncology | Admitting: Radiation Oncology

## 2017-07-30 ENCOUNTER — Other Ambulatory Visit: Payer: Self-pay | Admitting: *Deleted

## 2017-07-30 ENCOUNTER — Other Ambulatory Visit: Payer: Self-pay

## 2017-07-30 ENCOUNTER — Encounter: Payer: Self-pay | Admitting: Family Medicine

## 2017-07-30 ENCOUNTER — Telehealth: Payer: Self-pay | Admitting: Hematology and Oncology

## 2017-07-30 ENCOUNTER — Ambulatory Visit (HOSPITAL_BASED_OUTPATIENT_CLINIC_OR_DEPARTMENT_OTHER): Payer: Self-pay | Admitting: Hematology and Oncology

## 2017-07-30 ENCOUNTER — Other Ambulatory Visit (HOSPITAL_BASED_OUTPATIENT_CLINIC_OR_DEPARTMENT_OTHER): Payer: Self-pay

## 2017-07-30 ENCOUNTER — Ambulatory Visit (INDEPENDENT_AMBULATORY_CARE_PROVIDER_SITE_OTHER): Payer: Self-pay | Admitting: Family Medicine

## 2017-07-30 ENCOUNTER — Encounter: Payer: Self-pay | Admitting: Hematology and Oncology

## 2017-07-30 VITALS — BP 146/100 | HR 110 | Temp 99.6°F | Resp 14 | Ht 62.0 in | Wt 160.8 lb

## 2017-07-30 DIAGNOSIS — C3412 Malignant neoplasm of upper lobe, left bronchus or lung: Secondary | ICD-10-CM

## 2017-07-30 DIAGNOSIS — R000 Tachycardia, unspecified: Secondary | ICD-10-CM

## 2017-07-30 DIAGNOSIS — J9801 Acute bronchospasm: Secondary | ICD-10-CM

## 2017-07-30 DIAGNOSIS — Z510 Encounter for antineoplastic radiation therapy: Secondary | ICD-10-CM | POA: Diagnosis not present

## 2017-07-30 LAB — CBC WITH DIFFERENTIAL/PLATELET
BASO%: 0.3 % (ref 0.0–2.0)
BASOS ABS: 0.1 10*3/uL (ref 0.0–0.1)
EOS ABS: 0 10*3/uL (ref 0.0–0.5)
EOS%: 0.1 % (ref 0.0–7.0)
HCT: 33.6 % — ABNORMAL LOW (ref 38.4–49.9)
HEMOGLOBIN: 11.1 g/dL — AB (ref 13.0–17.1)
LYMPH%: 4.4 % — ABNORMAL LOW (ref 14.0–49.0)
MCH: 31 pg (ref 27.2–33.4)
MCHC: 32.9 g/dL (ref 32.0–36.0)
MCV: 94.2 fL (ref 79.3–98.0)
MONO#: 1.9 10*3/uL — AB (ref 0.1–0.9)
MONO%: 9.8 % (ref 0.0–14.0)
NEUT%: 85.4 % — ABNORMAL HIGH (ref 39.0–75.0)
NEUTROS ABS: 16.2 10*3/uL — AB (ref 1.5–6.5)
PLATELETS: 659 10*3/uL — AB (ref 140–400)
RBC: 3.57 10*6/uL — ABNORMAL LOW (ref 4.20–5.82)
RDW: 15.9 % — AB (ref 11.0–14.6)
WBC: 19 10*3/uL — AB (ref 4.0–10.3)
lymph#: 0.8 10*3/uL — ABNORMAL LOW (ref 0.9–3.3)

## 2017-07-30 LAB — COMPREHENSIVE METABOLIC PANEL
ALBUMIN: 2.3 g/dL — AB (ref 3.5–5.0)
ALK PHOS: 241 U/L — AB (ref 40–150)
ALT: 93 U/L — ABNORMAL HIGH (ref 0–55)
AST: 55 U/L — ABNORMAL HIGH (ref 5–34)
Anion Gap: 9 mEq/L (ref 3–11)
BILIRUBIN TOTAL: 1.05 mg/dL (ref 0.20–1.20)
BUN: 8.7 mg/dL (ref 7.0–26.0)
CO2: 33 meq/L — AB (ref 22–29)
CREATININE: 0.8 mg/dL (ref 0.7–1.3)
Calcium: 9.4 mg/dL (ref 8.4–10.4)
Chloride: 90 mEq/L — ABNORMAL LOW (ref 98–109)
GLUCOSE: 130 mg/dL (ref 70–140)
Potassium: 3.8 mEq/L (ref 3.5–5.1)
Sodium: 131 mEq/L — ABNORMAL LOW (ref 136–145)
TOTAL PROTEIN: 7.3 g/dL (ref 6.4–8.3)

## 2017-07-30 LAB — MAGNESIUM: Magnesium: 2.3 mg/dl (ref 1.5–2.5)

## 2017-07-30 MED ORDER — IPRATROPIUM BROMIDE 0.02 % IN SOLN
0.5000 mg | Freq: Once | RESPIRATORY_TRACT | Status: AC
  Administered 2017-07-30: 0.5 mg via RESPIRATORY_TRACT

## 2017-07-30 MED ORDER — ALBUTEROL SULFATE (2.5 MG/3ML) 0.083% IN NEBU
2.5000 mg | INHALATION_SOLUTION | Freq: Once | RESPIRATORY_TRACT | Status: AC
  Administered 2017-07-30: 2.5 mg via RESPIRATORY_TRACT

## 2017-07-30 MED ORDER — RADIAPLEXRX EX GEL
Freq: Once | CUTANEOUS | Status: AC
  Administered 2017-07-30: 10:00:00 via TOPICAL

## 2017-07-30 NOTE — Assessment & Plan Note (Signed)
61 year old male with minimal precedent comorbidities who initially presented with shortness of breath and voice hoarseness and was found to have a large mass in the left mediastinum extending into the left lung. Initially, staging imaging with CT of the abdomen and pelvis and MRI of the brain revealed no metastatic disease elsewhere. 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 Under assumption of a stage IIIB disease, we have started planning for chemoradiotherapy while we have obtained a PET/CT. Urgently, PET/CT demonstrates presence of more extensive disease with at least N3 lymph node disease and presence of a lytic lesion in the sternum with hypermetabolic uptake and a new pleural effusion on the left side. 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  Typically, this disease would be treated with systemic therapy only with palliative intent, but several nuances of this case lead [place] to treated differently for the benefit of the patient. #1 is presence of severe obstruction resulting in collapse of the left upper lobe and impending collapse of the left lower lobe which would likely produce a pneumonia in the setting of neutropenia that is likely to develop with systemic therapy. #2. For this histology of disease, radiation therapy is significantly more effective in debulking disease and will likely result in a more rapid improvement in the ventilation of the lung. #3 despite being metastatic disease, all gross disease appears to be confined within the field that may be included in the radiation, indeed, Dr. Tammi Klippel is already incorporating the sternal lesion into his radiation field based on our conversation today.  I believe that continuation of radiation therapy in best interest of the patient. Considering the stage IV disease and current state of the patient, I believe that the addition of  concurrent chemotherapy may be too much to tolerate and will likely result in the need for treatment interruption. Instead, I am changing my recommendation for the patient to proceed with radiation alone. I will plan on adding systemic therapy at completion of the radiation. Choice of agents will be slightly different as I will prefer to use carboplatin and gemcitabine based on the squamous cell morphology of the malignancy. Subsequently, other agents may be employed as we will learn more about the sensitivity and course of this disease.  Plan: --Consult IR for infusaport placement --Chemo-Ed and possible Carboplatin + Gemcitabine after completion of radiation therapy --Return to my clinic in 3 weeks for clinical monitoring and symptom management   Voice recognition software was used and creation of this note. Despite my best effort at editing the text, some misspelling/errors may have occurred.

## 2017-07-30 NOTE — Progress Notes (Signed)
Patient ID: Levi Brown, male    DOB: [birth year], 61 y.o.   MRN: [id number]  PCP: Levi Jun, FNP  Chief Complaint  Patient presents with  . Follow-up    SOB    Subjective:  HPI Levi Brown is a 61 y.o. male presents for follow-up of shortness of breath. Levi Brown is currently undergoing chemotherapy treatments for non-small cell cancer . He started chemotherapy treatments on today. He presents today for another duo-nebulizer treatment. He is uninsured and unable to obtain a nebulizer machine for in home use. Levi Brown reports improve ability to move air with nebulizer treatments. He denies any recent episodes of breathlessness or chest tightness. Reports improvement of hoarseness. He underwent his initial chemotherapy treatment today and feels that the treatment went well overall.  Social History   Social History  . Marital status: Single    Spouse name: N/A  . Number of children: N/A  . Years of education: N/A   Occupational History  . Not on file.   Social History Main Topics  . Smoking status: Former Smoker    Packs/day: 1.00    Years: 30.00    Types: Cigarettes    Quit date: 2017  . Smokeless tobacco: Never Used     Comment: quit smoking in 2017  . Alcohol use 8.4 - 12.6 oz/week    14 - 21 Cans of beer per week  . Drug use: No  . Sexual activity: No   Other Topics Concern  . Not on file   Social History Narrative  . No narrative on file    Family History  Problem Relation Age of Onset  . Asthma Mother   . COPD Mother   . Cancer Mother        breast?   Review of Systems See HPI  Patient Active Problem List   Diagnosis Date Noted  . Non-small cell cancer of left lung ([place]) 07/13/2017  . Elevated blood pressure reading 07/05/2017    No Known Allergies  Prior to Admission medications   Medication Sig Start Date End Date Taking? Authorizing Provider  acetaminophen (TYLENOL) 325 MG tablet Take 325 mg by mouth every 6 (six) hours as needed for  moderate pain.   Yes [provider]  albuterol (PROVENTIL HFA;VENTOLIN HFA) 108 (90 Base) MCG/ACT inhaler Inhale 2 puffs into the lungs every 4 (four) hours as needed for wheezing or shortness of breath (cough, shortness of breath or wheezing.). 07/13/17  Yes Levi Jun, FNP  amLODipine (NORVASC) 5 MG tablet Take 1 tablet (5 mg total) by mouth daily. 07/13/17  Yes Levi Jun, FNP  aspirin EC 81 MG tablet Take 1 tablet (81 mg total) by mouth daily. 07/08/17 07/08/18 Yes Florencia Reasons, MD  Dextromethorphan Polistirex (COUGH DM PO) Take 30 mLs by mouth daily as needed (cough).   Yes [provider]  Fluticasone Furoate (ARNUITY ELLIPTA) 100 MCG/ACT AEPB Inhale 2 puffs into the lungs daily. 07/13/17  Yes Levi Jun, FNP  HYDROcodone-homatropine (HYCODAN) 5-1.5 MG/5ML syrup Take 5 mLs by mouth every 8 (eight) hours as needed for cough. 07/27/17  Yes Levi Jun, FNP  metoprolol tartrate (LOPRESSOR) 25 MG tablet Take 1 tablet (25 mg total) by mouth 2 (two) times daily. 07/27/17  Yes Levi Jun, FNP  Multiple Vitamins-Minerals (DRY EYE FORMULA PO) Place 1 drop into both eyes daily.   Yes [provider]  Wound Cleansers (RADIAPLEX EX) Apply topically.   Yes [provider]    Past Medical, Surgical Family and Social History reviewed and updated.    Objective:   Today's Vitals   07/30/17 1411  BP: (!) 146/100  Pulse: (!) 110  Resp: 14  Temp: 99.6 F (37.6 C)  TempSrc: Oral  SpO2: 91%  Weight: 160 lb 12.8 oz (72.9 kg)  Height: 5\' 2"  (1.575 m)   Wt Readings from Last 3 Encounters:  07/30/17 160 lb 12.8 oz (72.9 kg)  07/27/17 161 lb (73 kg)  07/26/17 162 lb (73.5 kg)   Physical Exam  Constitutional: He appears well-developed and well-nourished.  Cardiovascular: Tachycardia present.   Mild wheezing noted upper right lobe  Pulmonary/Chest: Effort normal. He has wheezes.  Skin: Skin is warm.  Psychiatric: He has a normal mood  and affect. His behavior is normal. Judgment and thought content normal.   Assessment & Plan:  1. Bronchospasm - albuterol (PROVENTIL) (2.5 MG/3ML) 0.083% nebulizer solution 2.5 mg; Take 3 mLs (2.5 mg total) by nebulization. - ipratropium (ATROVENT) nebulizer solution 0.5 mg; Take 2.5 mLs (0.5 mg total) by nebulization once.  2. Tachycardia -Recently increased metoprolol 25 mg twice daily.   RTC: as needed for nebulizer treatments until able to obtain a machine for home use. Follow 08/02/17, evaluate respiratory status   Carroll Sage. Kenton Kingfisher, MSN, FNP-C [facility]  [address] [place], [place] [address] [phone]

## 2017-07-30 NOTE — Telephone Encounter (Signed)
Gave pt avs and calendar for upcoming appts.

## 2017-07-30 NOTE — Progress Notes (Signed)
[place] [facility] Follow-up Visit:  Assessment: Non-small cell cancer of left lung [facility]) 61 year old male with minimal precedent comorbidities who initially presented with shortness of breath and voice hoarseness and was found to have a large mass in the left mediastinum extending into the left lung. Initially, staging imaging with CT of the abdomen and pelvis and MRI of the brain revealed no metastatic disease elsewhere. 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 Under assumption of a stage IIIB disease, we have started planning for chemoradiotherapy while we have obtained a PET/CT. Urgently, PET/CT demonstrates presence of more extensive disease with at least N3 lymph node disease and presence of a lytic lesion in the sternum with hypermetabolic uptake and a new pleural effusion on the left side. 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  Typically, this disease would be treated with systemic therapy only with palliative intent, but several nuances of this case lead [place] to treated differently for the benefit of the patient. #1 is presence of severe obstruction resulting in collapse of the left upper lobe and impending collapse of the left lower lobe which would likely produce a pneumonia in the setting of neutropenia that is likely to develop with systemic therapy. #2. For this histology of disease, radiation therapy is significantly more effective in debulking disease and will likely result in a more rapid improvement in the ventilation of the lung. #3 despite being metastatic disease, all gross disease appears to be confined within the field that may be included in the radiation, indeed, Dr. Tammi Klippel is already incorporating the sternal lesion into his radiation field based on our conversation today.  I believe that continuation of radiation therapy in best interest of the  patient. Considering the stage IV disease and current state of the patient, I believe that the addition of concurrent chemotherapy may be too much to tolerate and will likely result in the need for treatment interruption. Instead, I am changing my recommendation for the patient to proceed with radiation alone. I will plan on adding systemic therapy at completion of the radiation. Choice of agents will be slightly different as I will prefer to use carboplatin and gemcitabine based on the squamous cell morphology of the malignancy. Subsequently, other agents may be employed as we will learn more about the sensitivity and course of this disease.  Plan: --Consult IR for infusaport placement --Chemo-Ed and possible Carboplatin + Gemcitabine after completion of radiation therapy --Return to my clinic in 3 weeks for clinical monitoring and symptom management   Voice recognition software was used and creation of this note. Despite my best effort at editing the text, some misspelling/errors may have occurred.   No orders of the defined types were placed in this encounter.   Cancer Staging Non-small cell cancer of left lung [facility]) Staging form: Lung, AJCC 8th Edition - Clinical stage from 07/13/2017: Stage IVA (cT4, cN3, pM1b) - Signed by Ardath Sax, MD on 07/30/2017   All questions were answered.  . The patient knows to call the clinic with any problems, questions or concerns.  This note was electronically signed.    History of Presenting Illness Levi Brown 61 y.o. following up with the [facility] for recent diagnosis of squamous cell carcinoma non-small cell lung cancer. Please see my consultation note from 07/06/17 for details of the initial presentation.  At the present time, patient has started radiation therapy to  the chest yesterday. He also underwent a staging PET/CT which he returns to the clinic to review. He denies any new symptoms. Continues to have significant amount of  dyspnea especially with exertion. No active significant chest pain.   Oncological/hematological History:   Non-small cell cancer of left lung ([place])   07/05/2017 Imaging    CT Chest: amorphous 6.6 x 6.5 x 5.2 cm mass centered in the left mediastinum which completely obliterates the left upper lobe bronchus and impinges into the left lower lobe bronchus. There is also mass effect and significant narrowing of the left main pulmonary artery. No evidence of distant metastatic disease. Complete collapse of the left upper lobe. It is difficult to identify where the primary mediastinal mass ends and atelectatic lung begins. Direct invasion into the lung is not excluded.      07/06/2017 Imaging    CT Abdomen/Pelvis: No CT evidence for metastatic disease to the abdomen or pelvis.      07/07/2017 Imaging    No acute intracranial process. No evidence for intracranial metastatic disease. Mild to moderate chronic small vessel ischemic disease with remote lacunar infarcts involving the left basal ganglia, left thalamus, and pons.      07/13/2017 Initial Diagnosis    Non-small cell cancer of left lung [facility]): high-grade poorly differentiated squamous cell carcinoma based on biopsy on 07/06/17      07/29/2017 Imaging    PET-CT: Left-sided obstructing mediastinal mass with intense FDG uptake. Mediastinal mass completely occludes the left upper lobe bronchus with postobstructive atelectasis of the left upper lobe. Contralateral, right paratracheal hypermetabolic lymph node. Hypermetabolic lytic metastasis involves the sternum. Left pleural effusion.      07/29/2017 -  Radiation Therapy    Dr Tammi Klippel:       Medical History: Past Medical History:  Diagnosis Date  . Former smoker    Quit smoking in early 2017  . Non-small cell cancer of left lung ([place]) 07/13/2017    Surgical History: Past Surgical History:  Procedure Laterality Date  . VIDEO BRONCHOSCOPY Bilateral 07/07/2017   Procedure: VIDEO  BRONCHOSCOPY WITHOUT FLUORO;  Surgeon: Collene Gobble, MD;  Location[facility] ENDOSCOPY;  Service: Cardiopulmonary;  Laterality: Bilateral;    Family History: Family History  Problem Relation Age of Onset  . Asthma Mother   . COPD Mother   . Cancer Mother        breast?    Social History: Social History   Social History  . Marital status: Single    Spouse name: N/A  . Number of children: N/A  . Years of education: N/A   Occupational History  . Not on file.   Social History Main Topics  . Smoking status: Former Smoker    Packs/day: 1.00    Years: 30.00    Types: Cigarettes    Quit date: 2017  . Smokeless tobacco: Never Used     Comment: quit smoking in 2017  . Alcohol use 8.4 - 12.6 oz/week    14 - 21 Cans of beer per week  . Drug use: No  . Sexual activity: No   Other Topics Concern  . Not on file   Social History Narrative  . No narrative on file    Allergies: No Known Allergies  Medications:  Current Outpatient Prescriptions  Medication Sig Dispense Refill  . acetaminophen (TYLENOL) 325 MG tablet Take 325 mg by mouth every 6 (six) hours as needed for moderate pain.    Marland Kitchen albuterol (PROVENTIL HFA;VENTOLIN HFA) 108 (  90 Base) MCG/ACT inhaler Inhale 2 puffs into the lungs every 4 (four) hours as needed for wheezing or shortness of breath (cough, shortness of breath or wheezing.). 1 Inhaler 1  . amLODipine (NORVASC) 5 MG tablet Take 1 tablet (5 mg total) by mouth daily. 90 tablet 3  . aspirin EC 81 MG tablet Take 1 tablet (81 mg total) by mouth daily. 30 tablet 0  . Dextromethorphan Polistirex (COUGH DM PO) Take 30 mLs by mouth daily as needed (cough).    . Fluticasone Furoate (ARNUITY ELLIPTA) 100 MCG/ACT AEPB Inhale 2 puffs into the lungs daily. 30 each 11  . HYDROcodone-homatropine (HYCODAN) 5-1.5 MG/5ML syrup Take 5 mLs by mouth every 8 (eight) hours as needed for cough. 240 mL 0  . metoprolol tartrate (LOPRESSOR) 25 MG tablet Take 1 tablet (25 mg total) by mouth  2 (two) times daily. 60 tablet 3  . Multiple Vitamins-Minerals (DRY EYE FORMULA PO) Place 1 drop into both eyes daily.    . Wound Cleansers (RADIAPLEX EX) Apply topically.     No current facility-administered medications for this visit.     Review of Systems: Review of Systems  Constitutional: Positive for fatigue and unexpected weight change. Negative for appetite change, diaphoresis and fever.  HENT:   Positive for voice change. Negative for hearing loss, lump/mass, mouth sores, nosebleeds, sore throat, tinnitus and trouble swallowing.   Eyes: Negative for eye problems and icterus.  Respiratory: Positive for cough, hemoptysis, shortness of breath and wheezing. Negative for chest tightness.   Cardiovascular: Negative for chest pain, leg swelling and palpitations.  Gastrointestinal: Negative.   Endocrine: Negative for hot flashes.  Genitourinary: Negative.    Musculoskeletal: Negative.   Skin: Negative.   Neurological: Negative.   Hematological: Negative.   Psychiatric/Behavioral: Negative.      PHYSICAL EXAMINATION Blood pressure 105/60, pulse (!) 128, temperature 98.5 F (36.9 C), temperature source Oral, resp. rate 17, height _0  (1.575 m), weight 160 lb 14.4 oz (73 kg), SpO2 96 %.  ECOG PERFORMANCE STATUS: 2 - Symptomatic, <50% confined to bed  Physical Exam  Constitutional: He is oriented to person, place, and time and well-developed, well-nourished, and in no distress. No distress.  HENT:  Head: Normocephalic and atraumatic.  Mouth/Throat: Oropharynx is clear and moist. No oropharyngeal exudate.  Eyes: Pupils are equal, round, and reactive to light. Conjunctivae and EOM are normal. Left eye exhibits no discharge. No scleral icterus.  Neck: Normal range of motion. No thyromegaly present.  Cardiovascular: Normal rate, regular rhythm and normal heart sounds.  Exam reveals no friction rub.   No murmur heard. Pulmonary/Chest: Effort normal. No stridor. No respiratory  distress.  Expiratory wheezing in the middle and lower portions of the left lung. Decreased breath sounds in the upper left lung.  Abdominal: Soft. He exhibits no distension and no mass. There is no tenderness.  Musculoskeletal: He exhibits no edema.  Lymphadenopathy:    He has no cervical adenopathy.  Neurological: He is alert and oriented to person, place, and time. He displays normal reflexes. No cranial nerve deficit. He exhibits normal muscle tone.  Patient's voice is decreased in strength without dysarthria or dysphonia  Skin: Skin is warm and dry. No rash noted. He is not diaphoretic. No erythema.     LABORATORY DATA: I have personally reviewed the data as listed: Appointment on 07/30/2017  Component Date Value Ref Range Status  . WBC 07/30/2017 19.0* 4.0 - 10.3 10e3/uL Final  . NEUT# 07/30/2017 16.2* 1.5 -  6.5 10e3/uL Final  . HGB 07/30/2017 11.1* 13.0 - 17.1 g/dL Final  . HCT 07/30/2017 33.6* 38.4 - 49.9 % Final  . Platelets 07/30/2017 659* 140 - 400 10e3/uL Final  . MCV 07/30/2017 94.2  79.3 - 98.0 fL Final  . MCH 07/30/2017 31.0  27.2 - 33.4 pg Final  . MCHC 07/30/2017 32.9  32.0 - 36.0 g/dL Final  . RBC 07/30/2017 3.57* 4.20 - 5.82 10e6/uL Final  . RDW 07/30/2017 15.9* 11.0 - 14.6 % Final  . lymph# 07/30/2017 0.8* 0.9 - 3.3 10e3/uL Final  . MONO# 07/30/2017 1.9* 0.1 - 0.9 10e3/uL Final  . Eosinophils Absolute 07/30/2017 0.0  0.0 - 0.5 10e3/uL Final  . Basophils Absolute 07/30/2017 0.1  0.0 - 0.1 10e3/uL Final  . NEUT% 07/30/2017 85.4* 39.0 - 75.0 % Final  . LYMPH% 07/30/2017 4.4* 14.0 - 49.0 % Final  . MONO% 07/30/2017 9.8  0.0 - 14.0 % Final  . EOS% 07/30/2017 0.1  0.0 - 7.0 % Final  . BASO% 07/30/2017 0.3  0.0 - 2.0 % Final  . Sodium 07/30/2017 131* 136 - 145 mEq/L Final  . Potassium 07/30/2017 3.8  3.5 - 5.1 mEq/L Final  . Chloride 07/30/2017 90* 98 - 109 mEq/L Final  . CO2 07/30/2017 33* 22 - 29 mEq/L Final  . Glucose 07/30/2017 130  70 - 140 mg/dl Final    Glucose reference range is for nonfasting patients. Fasting glucose reference range is 70- 100.  Marland Kitchen BUN 07/30/2017 8.7  7.0 - 26.0 mg/dL Final  . Creatinine 07/30/2017 0.8  0.7 - 1.3 mg/dL Final  . Total Bilirubin 07/30/2017 1.05  0.20 - 1.20 mg/dL Final  . Alkaline Phosphatase 07/30/2017 241* 40 - 150 U/L Final  . AST 07/30/2017 55* 5 - 34 U/L Final  . ALT 07/30/2017 93* 0 - 55 U/L Final  . Total Protein 07/30/2017 7.3  6.4 - 8.3 g/dL Final  . Albumin 07/30/2017 2.3* 3.5 - 5.0 g/dL Final  . Calcium 07/30/2017 9.4  8.4 - 10.4 mg/dL Final  . Anion Gap 07/30/2017 9  3 - 11 mEq/L Final  . EGFR 07/30/2017 >90  >90 ml/min/1.73 m2 Final   eGFR is calculated using the CKD-EPI Creatinine Equation (2009)  . Magnesium 07/30/2017 2.3  1.5 - 2.5 mg/dl Final  Hospital Outpatient Visit on 07/29/2017  Component Date Value Ref Range Status  . Glucose-Capillary 07/29/2017 82  65 - 99 mg/dL Final       Ardath Sax, MD

## 2017-08-02 ENCOUNTER — Encounter: Payer: Self-pay | Admitting: Family Medicine

## 2017-08-02 ENCOUNTER — Ambulatory Visit
Admission: RE | Admit: 2017-08-02 | Discharge: 2017-08-02 | Disposition: A | Discharge status: Home / Self Care | Payer: Medicaid Other | Source: Ambulatory Visit | Attending: Radiation Oncology | Admitting: Radiation Oncology

## 2017-08-02 ENCOUNTER — Other Ambulatory Visit: Payer: Self-pay | Admitting: *Deleted

## 2017-08-02 ENCOUNTER — Ambulatory Visit (INDEPENDENT_AMBULATORY_CARE_PROVIDER_SITE_OTHER): Payer: Self-pay | Admitting: Family Medicine

## 2017-08-02 VITALS — BP 124/68 | HR 100 | Temp 99.8°F | Resp 16 | Ht 62.0 in | Wt 160.2 lb

## 2017-08-02 DIAGNOSIS — Z510 Encounter for antineoplastic radiation therapy: Secondary | ICD-10-CM | POA: Diagnosis not present

## 2017-08-02 DIAGNOSIS — D72829 Elevated white blood cell count, unspecified: Secondary | ICD-10-CM

## 2017-08-02 DIAGNOSIS — C3492 Malignant neoplasm of unspecified part of left bronchus or lung: Secondary | ICD-10-CM

## 2017-08-02 DIAGNOSIS — R0602 Shortness of breath: Secondary | ICD-10-CM

## 2017-08-02 LAB — CBC WITH DIFFERENTIAL/PLATELET
BASOS ABS: 0 {cells}/uL (ref 0–200)
Basophils Relative: 0 %
EOS PCT: 1 %
Eosinophils Absolute: 177 cells/uL (ref 15–500)
HCT: 31.5 % — ABNORMAL LOW (ref 38.5–50.0)
HEMOGLOBIN: 10.4 g/dL — AB (ref 13.2–17.1)
LYMPHS PCT: 8 %
Lymphs Abs: 1416 cells/uL (ref 850–3900)
MCH: 31.3 pg (ref 27.0–33.0)
MCHC: 33 g/dL (ref 32.0–36.0)
MCV: 94.9 fL (ref 80.0–100.0)
MONOS PCT: 6 %
MPV: 8.5 fL (ref 7.5–12.5)
Monocytes Absolute: 1062 cells/uL — ABNORMAL HIGH (ref 200–950)
Neutro Abs: 15045 cells/uL — ABNORMAL HIGH (ref 1500–7800)
Neutrophils Relative %: 85 %
Platelets: 890 10*3/uL — ABNORMAL HIGH (ref 140–400)
RBC: 3.32 MIL/uL — AB (ref 4.20–5.80)
RDW: 14.8 % (ref 11.0–15.0)
WBC: 17.7 10*3/uL — AB (ref 3.8–10.8)

## 2017-08-02 MED ORDER — IPRATROPIUM BROMIDE 0.02 % IN SOLN
0.5000 mg | Freq: Once | RESPIRATORY_TRACT | Status: AC
  Administered 2017-08-02: 0.5 mg via RESPIRATORY_TRACT

## 2017-08-02 MED ORDER — IPRATROPIUM BROMIDE 0.02 % IN SOLN: 0.5000 mg | Freq: Four times a day (QID) | RESPIRATORY_TRACT | 12 refills | Status: DC

## 2017-08-02 MED ORDER — ALBUTEROL SULFATE (2.5 MG/3ML) 0.083% IN NEBU: 2.5000 mg | INHALATION_SOLUTION | Freq: Four times a day (QID) | RESPIRATORY_TRACT | 1 refills | Status: DC | PRN

## 2017-08-02 MED ORDER — ALBUTEROL SULFATE (2.5 MG/3ML) 0.083% IN NEBU
2.5000 mg | INHALATION_SOLUTION | Freq: Once | RESPIRATORY_TRACT | Status: AC
  Administered 2017-08-02: 2.5 mg via RESPIRATORY_TRACT

## 2017-08-02 MED FILL — IPRATROPIUM BR 0.02% SOLN: 0.02 | 7 days supply | Qty: 188 | Fill #0

## 2017-08-02 MED FILL — ?ALBUTEROL SUL 2.5 MG/3 MLS: (2.5 MG/3ML | 15 days supply | Qty: 180 | Fill #0

## 2017-08-02 NOTE — Progress Notes (Signed)
Patient ID: Levi Brown, male    DOB: Jul 02, 1956, 61 y.o.   MRN: [id number]  PCP: [name], FNP  Chief Complaint  Patient presents with  . Follow-up    sob    Subjective:  HPI Levi Brown is a 61 y.o. male presents for evaluation of shortness of breath. Levi Brown is currently undergoing aggressive chemotherapy for non-small cell lung cancer. He was recently diagnosed with mediastinal mass 07/05/2017, after experiencing worsening shortness of breath and persistent hoarseness of voice. Today he presents to follow-up on recent bronchospasms and hypoxia which were both responsive to nebulizer treatments. He reports improvement of shortness of breath and reports that he is going to purchase a nebulizer machine for home use today and needs a prescription for nebulizer machine solution.   Social History   Social History  . Marital status: Single    Spouse name: N/A  . Number of children: N/A  . Years of education: N/A   Occupational History  . Not on file.   Social History Main Topics  . Smoking status: Former Smoker    Packs/day: 1.00    Years: 30.00    Types: Cigarettes    Quit date: 2017  . Smokeless tobacco: Never Used     Comment: quit smoking in 2017  . Alcohol use 8.4 - 12.6 oz/week    14 - 21 Cans of beer per week  . Drug use: No  . Sexual activity: No   Other Topics Concern  . Not on file   Social History Narrative  . No narrative on file    Family History  Problem Relation Age of Onset  . Asthma Mother   . COPD Mother   . Cancer Mother        breast?   Review of Systems See HPI  Patient Active Problem List   Diagnosis Date Noted  . Non-small cell cancer of left lung ([place]) 07/13/2017  . Elevated blood pressure reading 07/05/2017    No Known Allergies  Prior to Admission medications   Medication Sig Start Date End Date Taking? Authorizing Provider  acetaminophen (TYLENOL) 325 MG tablet Take 325 mg by mouth every 6 (six) hours as needed  for moderate pain.   Yes [provider]  albuterol (PROVENTIL HFA;VENTOLIN HFA) 108 (90 Base) MCG/ACT inhaler Inhale 2 puffs into the lungs every 4 (four) hours as needed for wheezing or shortness of breath (cough, shortness of breath or wheezing.). 07/13/17  Yes [name], FNP  amLODipine (NORVASC) 5 MG tablet Take 1 tablet (5 mg total) by mouth daily. 07/13/17  Yes [name], FNP  aspirin EC 81 MG tablet Take 1 tablet (81 mg total) by mouth daily. 07/08/17 07/08/18 Yes Florencia Reasons, MD  Dextromethorphan Polistirex (COUGH DM PO) Take 30 mLs by mouth daily as needed (cough).   Yes [provider]  Fluticasone Furoate (ARNUITY ELLIPTA) 100 MCG/ACT AEPB Inhale 2 puffs into the lungs daily. 07/13/17  Yes [name], FNP  HYDROcodone-homatropine (HYCODAN) 5-1.5 MG/5ML syrup Take 5 mLs by mouth every 8 (eight) hours as needed for cough. 07/27/17  Yes [name], FNP  metoprolol tartrate (LOPRESSOR) 25 MG tablet Take 1 tablet (25 mg total) by mouth 2 (two) times daily. 07/27/17  Yes [name], FNP  Multiple Vitamins-Minerals (DRY EYE FORMULA PO) Place 1 drop into both eyes daily.   Yes [provider]  Wound Cleansers (RADIAPLEX EX) Apply topically.   Yes [provider]    Past Medical, Surgical Family and Social History reviewed and updated.    Objective:   Today's Vitals   08/02/17 1337  BP: 124/68  Pulse: 100  Resp: 16  Temp: 99.8 F (37.7 C)  TempSrc: Oral  SpO2: 91%  Weight: 160 lb 3.2 oz (72.7 kg)  Height: 5\' 2"  (1.575 m)    Wt Readings from Last 3 Encounters:  08/02/17 160 lb 3.2 oz (72.7 kg)  07/30/17 160 lb 14.4 oz (73 kg)  07/30/17 160 lb 12.8 oz (72.9 kg)   Physical Exam  Constitutional: He is oriented to person, place, and time. He appears well-developed and well-nourished.  HENT:  Head: Normocephalic and atraumatic.  Neck: Normal range of motion.  Cardiovascular: Intact distal pulses.  Tachycardia  present.   Pulmonary/Chest: He has wheezes. He has no rales. He exhibits tenderness.  Musculoskeletal: Normal range of motion.  Neurological: He is alert and oriented to person, place, and time.  Skin: Skin is warm and dry.  Psychiatric: He has a normal mood and affect. His behavior is normal. Judgment and thought content normal.   Assessment & Plan:  1. Shortness of breath - albuterol (PROVENTIL) (2.5 MG/3ML) 0.083% nebulizer solution 2.5 mg; Take 3 mLs (2.5 mg total) by nebulization once. - ipratropium (ATROVENT) nebulizer solution 0.5 mg; Take 2.5 mLs (0.5 mg total) by nebulization once.  2. Leukocytosis, unspecified type - CBC with Differential   RTC: 3 months for chronic disease management follow-up or sooner if needed.    Carroll Sage. Kenton Kingfisher, MSN, FNP-C [facility]  [address] [place], [place] [address] [phone]

## 2017-08-02 NOTE — Patient Instructions (Addendum)
Please notify me here at the office or notify the cancer center is your temperature exceeds 100.5 or you begin to experiencing worsening shortness of breath, productive cough, or chest pain.     Shortness of Breath, Adult Shortness of breath means you have trouble breathing. Your lungs are organs for breathing. Follow these instructions at home: Pay attention to any changes in your symptoms. Take these actions to help with your condition:  Do not smoke. Smoking can cause shortness of breath. If you need help to quit smoking, ask your doctor.  Avoid things that can make it harder to breathe, such as: ? Mold. ? Dust. ? Air pollution. ? Chemical smells. ? Things that can cause allergy symptoms (allergens), if you have allergies.  Keep your living space clean and free of mold and dust.  Rest as needed. Slowly return to your usual activities.  Take over-the-counter and prescription medicines, including oxygen and inhaled medicines, only as told by your doctor.  Keep all follow-up visits as told by your doctor. This is important.  Contact a doctor if:  Your condition does not get better as soon as expected.  You have a hard time doing your normal activities, even after you rest.  You have new symptoms. Get help right away if:  You have trouble breathing when you are resting.  You feel light-headed or you faint.  You have a cough that is not helped by medicines.  You cough up blood.  You have pain with breathing.  You have pain in your chest, arms, shoulders, or belly (abdomen).  You have a fever.  You cannot walk up stairs.  You cannot exercise the way you normally do. This information is not intended to replace advice given to you by your health care provider. Make sure you discuss any questions you have with your health care provider. Document Released: 05/18/2008 Document Revised: 12/17/2016 Document Reviewed: 12/17/2016 Elsevier Interactive Patient Education  2017  [place].     Chemotherapy Chemotherapy is the use of medicines to stop or slow the growth of cancer cells. Depending on the type and stage of your cancer, you may have chemotherapy to:  Cure your cancer.  Slow the progression of your cancer.  Ease your cancer symptoms.  Improve the benefits of radiation treatment.  Shrink a tumor before surgery.  Rid the body of cancer cells that remain after a tumor is surgically removed.  How is chemotherapy given? Chemotherapy may be given:  By mouth in liquid or pill form.  Through a thin tube that is inserted into a vein or artery.  By getting a shot.  By rubbing a cream or ointment on your skin.  Through liquids that are placed directly into various areas of the body, such as the abdomen, chest, or bladder.  How often is chemotherapy given? Chemotherapy may be given continuously over time, or it may be given in cycles. For example, you may take the medicine for one week out of every month. For how long will I need chemotherapy treatments? The length of treatment depends on many factors, including:  The type of cancer.  Whether the cancer has spread.  How you respond to the chemotherapy.  Whether you develop side effects.  Some types of chemotherapy medicine are given only one time. Others are given for months, years, or for life. What safety precautions must I take while on chemotherapy? Chemotherapy medicines are very strong. They will be in all of your bodily fluids, including your  urine, stool, saliva, sweat, tears, vaginal secretions, and semen. You must carefully follow some safety precautions to prevent harm to others while you are using these medicines. Here are some recommended precautions:  Make sure that people who help care for you wear disposable gloves if they are going to come into contact with any of your bodily fluids. Women who are pregnant or breastfeeding should not handle any of your bodily  fluids.  Wash any clothes, towels, and linens that may have your bodily fluids on them twice in a washing machine using very hot water.  Dispose of adult diapers, tampons, and sanitary napkins by first sealing them in a plastic bag.  Use a condom when having sex for at least 2 weeks after receiving your chemotherapy.  Do not share beverages or food.  Keep your chemotherapy medicines in their original bottles. Keep them in a high, safe location, away from children. Do not expose them to heat or moisture. Do not put them in containers with other types of medicines.  Dispose of all wrappers for your chemotherapy medicines by sealing them in a separate plastic bag.  Do not throw away extra medicine, and do not flush it down the toilet. Take medicine that you are not going to use to your health care provider's office where it can be disposed of properly.  Follow your health care provider's directions for the proper disposal of needles, IV tubing, and other medical supplies that have come into contact with your chemotherapy medicines.  If you are issued a hazardous waste container, make sure you understand the directions for using it.  Wash your hands thoroughly with warm water and soap after using the bathroom. Dry your hands with disposable paper towels.  When using the toilet: ? Flush it twice after each use, including after vomiting. ? Close the lid of the toilet prior to flushing. This helps to avoid splashing. ? Both men and women should sit to use the toilet. This helps avoid splashing.  What are the side effects of chemotherapy? Side effects depend on a variety of factors, including:  The specific type of chemotherapy medicine used.  The dosage.  How long the medicine is used for.  Your overall health.  Some of the side effects you may experience include:  Fatigue and decreased energy.  Decreased appetite.  Changes in your sense of smell or  taste.  Nausea.  Vomiting.  Constipation or diarrhea.  Hair loss.  Increased susceptibility to infection.  Easy bleeding.  Mouth sores.  Burning or tingling in the hands or feet.  Memory problems.  This information is not intended to replace advice given to you by your health care provider. Make sure you discuss any questions you have with your health care provider. Document Released: 09/27/2007 Document Revised: 06/19/2016 Document Reviewed: 05/08/2014 Elsevier Interactive Patient Education  2018 [employer].

## 2017-08-03 ENCOUNTER — Encounter: Payer: Self-pay | Admitting: *Deleted

## 2017-08-03 ENCOUNTER — Ambulatory Visit
Admission: RE | Admit: 2017-08-03 | Discharge: 2017-08-03 | Disposition: A | Discharge status: Home / Self Care | Payer: Medicaid Other | Source: Ambulatory Visit | Attending: Radiation Oncology | Admitting: Radiation Oncology

## 2017-08-03 DIAGNOSIS — Z510 Encounter for antineoplastic radiation therapy: Secondary | ICD-10-CM | POA: Diagnosis not present

## 2017-08-03 NOTE — Progress Notes (Signed)
Patient wanted vital signs taken prior to radiation treatment.  Patient states that he took some hydrocodone cough medicine and immediately following he started having some shakiness and sweating.  He stated that he did not measure the medicine but just used the lid for taking the medication.  He stated that it resolved within 1 hour and that he did not seek any emergency medical attention.  Instructed the patient that he needs to seek medical attention if he has any usual weakness, disorientation, etc.  Patient's vital signs are stable and he wanted to proceed with treatment.

## 2017-08-04 ENCOUNTER — Ambulatory Visit
Admission: RE | Admit: 2017-08-04 | Discharge: 2017-08-04 | Disposition: A | Discharge status: Home / Self Care | Payer: Medicaid Other | Source: Ambulatory Visit | Attending: Radiation Oncology | Admitting: Radiation Oncology

## 2017-08-04 DIAGNOSIS — Z510 Encounter for antineoplastic radiation therapy: Secondary | ICD-10-CM | POA: Diagnosis not present

## 2017-08-04 LAB — POCT URINALYSIS DIP (DEVICE)
Glucose, UA: 100 mg/dL — AB
Ketones, ur: NEGATIVE mg/dL
LEUKOCYTES UA: NEGATIVE
Nitrite: NEGATIVE
PH: 6 (ref 5.0–8.0)
Protein, ur: 30 mg/dL — AB
SPECIFIC GRAVITY, URINE: 1.02 (ref 1.005–1.030)

## 2017-08-05 ENCOUNTER — Ambulatory Visit
Admission: RE | Admit: 2017-08-05 | Discharge: 2017-08-05 | Disposition: A | Discharge status: Home / Self Care | Payer: Medicaid Other | Source: Ambulatory Visit | Attending: Radiation Oncology | Admitting: Radiation Oncology

## 2017-08-05 DIAGNOSIS — Z510 Encounter for antineoplastic radiation therapy: Secondary | ICD-10-CM | POA: Diagnosis not present

## 2017-08-06 ENCOUNTER — Ambulatory Visit
Admission: RE | Admit: 2017-08-06 | Discharge: 2017-08-06 | Disposition: A | Discharge status: Home / Self Care | Payer: Medicaid Other | Source: Ambulatory Visit | Attending: Radiation Oncology | Admitting: Radiation Oncology

## 2017-08-06 ENCOUNTER — Encounter: Payer: Self-pay | Admitting: Pharmacy Technician

## 2017-08-06 DIAGNOSIS — Z510 Encounter for antineoplastic radiation therapy: Secondary | ICD-10-CM | POA: Diagnosis not present

## 2017-08-06 NOTE — Progress Notes (Signed)
The patient is approved for by [employer] for Neulasta. Coverage is from 08/05/17 - 08/05/18. Enrollment is based on Self-Pay. The patient has no current orders for Neulasta, however enrollment is a proactive measure in case the requires the drug.

## 2017-08-09 ENCOUNTER — Ambulatory Visit
Admission: RE | Admit: 2017-08-09 | Discharge: 2017-08-09 | Disposition: A | Discharge status: Home / Self Care | Payer: Medicaid Other | Source: Ambulatory Visit | Attending: Radiation Oncology | Admitting: Radiation Oncology

## 2017-08-09 DIAGNOSIS — Z510 Encounter for antineoplastic radiation therapy: Secondary | ICD-10-CM | POA: Diagnosis not present

## 2017-08-10 ENCOUNTER — Ambulatory Visit
Admission: RE | Admit: 2017-08-10 | Discharge: 2017-08-10 | Disposition: A | Discharge status: Home / Self Care | Payer: Medicaid Other | Source: Ambulatory Visit | Attending: Radiation Oncology | Admitting: Radiation Oncology

## 2017-08-10 DIAGNOSIS — Z510 Encounter for antineoplastic radiation therapy: Secondary | ICD-10-CM | POA: Diagnosis not present

## 2017-08-11 ENCOUNTER — Ambulatory Visit
Admission: RE | Admit: 2017-08-11 | Discharge: 2017-08-11 | Disposition: A | Discharge status: Home / Self Care | Payer: Medicaid Other | Source: Ambulatory Visit | Attending: Radiation Oncology | Admitting: Radiation Oncology

## 2017-08-11 DIAGNOSIS — Z510 Encounter for antineoplastic radiation therapy: Secondary | ICD-10-CM | POA: Diagnosis not present

## 2017-08-11 MED FILL — AMLODIPINE BESYLATE 5 MG TA: 5 | 30 days supply | Qty: 30 | Fill #1

## 2017-08-11 MED FILL — METOPROLOL TARTRATE 25 MG T: 25 | 30 days supply | Qty: 60 | Fill #0

## 2017-08-12 ENCOUNTER — Ambulatory Visit
Admission: RE | Admit: 2017-08-12 | Discharge: 2017-08-12 | Disposition: A | Discharge status: Home / Self Care | Payer: Medicaid Other | Source: Ambulatory Visit | Attending: Radiation Oncology | Admitting: Radiation Oncology

## 2017-08-12 DIAGNOSIS — Z510 Encounter for antineoplastic radiation therapy: Secondary | ICD-10-CM | POA: Diagnosis not present

## 2017-08-13 ENCOUNTER — Ambulatory Visit
Admission: RE | Admit: 2017-08-13 | Discharge: 2017-08-13 | Disposition: A | Discharge status: Home / Self Care | Payer: Medicaid Other | Source: Ambulatory Visit | Attending: Radiation Oncology | Admitting: Radiation Oncology

## 2017-08-13 DIAGNOSIS — Z510 Encounter for antineoplastic radiation therapy: Secondary | ICD-10-CM | POA: Diagnosis not present

## 2017-08-17 ENCOUNTER — Other Ambulatory Visit: Payer: Self-pay | Admitting: *Deleted

## 2017-08-17 ENCOUNTER — Encounter: Payer: Self-pay | Admitting: Family Medicine

## 2017-08-17 ENCOUNTER — Ambulatory Visit
Admission: RE | Admit: 2017-08-17 | Discharge: 2017-08-17 | Disposition: A | Discharge status: Home / Self Care | Payer: Medicaid Other | Source: Ambulatory Visit | Attending: Radiation Oncology | Admitting: Radiation Oncology

## 2017-08-17 ENCOUNTER — Ambulatory Visit (INDEPENDENT_AMBULATORY_CARE_PROVIDER_SITE_OTHER): Payer: Self-pay | Admitting: Family Medicine

## 2017-08-17 VITALS — BP 116/70 | HR 100 | Temp 98.6°F | Resp 14 | Ht 62.0 in | Wt 157.6 lb

## 2017-08-17 DIAGNOSIS — R600 Localized edema: Secondary | ICD-10-CM

## 2017-08-17 DIAGNOSIS — R0602 Shortness of breath: Secondary | ICD-10-CM

## 2017-08-17 DIAGNOSIS — Z510 Encounter for antineoplastic radiation therapy: Secondary | ICD-10-CM | POA: Diagnosis not present

## 2017-08-17 MED ORDER — FLUTICASONE FUROATE 100 MCG/ACT IN AEPB: 2.0000 | INHALATION_SPRAY | Freq: Every day | RESPIRATORY_TRACT | 3 refills | Status: DC

## 2017-08-17 MED ORDER — ALBUTEROL SULFATE HFA 108 (90 BASE) MCG/ACT IN AERS: 2.0000 | INHALATION_SPRAY | RESPIRATORY_TRACT | 3 refills | Status: AC | PRN

## 2017-08-17 MED ORDER — POTASSIUM CHLORIDE CRYS ER 20 MEQ PO TBCR: 20.0000 meq | EXTENDED_RELEASE_TABLET | Freq: Every day | ORAL | 0 refills | Status: AC

## 2017-08-17 MED ORDER — FUROSEMIDE 40 MG PO TABS: 40.0000 mg | ORAL_TABLET | Freq: Every day | ORAL | 0 refills | Status: DC

## 2017-08-17 MED FILL — FUROSEMIDE 40 MG TABLET: 40 | 5 days supply | Qty: 5 | Fill #0

## 2017-08-17 MED FILL — POTASSIUM CL ER 20 MEQ TAB: 20 | 5 days supply | Qty: 5 | Fill #0

## 2017-08-17 NOTE — Patient Instructions (Signed)
Take Lasix 40 mg once daily and potassium tablets for 5 days.

## 2017-08-17 NOTE — Progress Notes (Signed)
Patient ID: Levi Brown, male    DOB: 07/13/1956, 61 y.o.   MRN: [id number]  PCP: [name], FNP  Chief Complaint  Patient presents with  . Foot Swelling    Subjective:  HPI Levi Brown is a 61 y.o. male suffers from non-small cell lung carcinoma and undergoing current chemotherapy treatment daily presents for evaluation for evaluation of acute bilateral lower leg and feet swelling. He reports that the swelling began x 1 day ago and upon awakening this morning he was unable to put on his shoes due to extent of swelling. He is suffering from a chronic cough and shortness of breath secondary to recent diagnosis of lung cancer. Levi Brown has no prior history of heart failure or heart disease and denies recent ingestion of food high in sodium. Social History   Social History  . Marital status: Single    Spouse name: N/A  . Number of children: N/A  . Years of education: N/A   Occupational History  . Not on file.   Social History Main Topics  . Smoking status: Former Smoker    Packs/day: 1.00    Years: 30.00    Types: Cigarettes    Quit date: 2017  . Smokeless tobacco: Never Used     Comment: quit smoking in 2017  . Alcohol use 8.4 - 12.6 oz/week    14 - 21 Cans of beer per week  . Drug use: No  . Sexual activity: No   Other Topics Concern  . Not on file   Social History Narrative  . No narrative on file    Family History  Problem Relation Age of Onset  . Asthma Mother   . COPD Mother   . Cancer Mother        breast?   Review of Systems See HPI  Patient Active Problem List   Diagnosis Date Noted  . Non-small cell cancer of left lung ([place]) 07/13/2017  . Elevated blood pressure reading 07/05/2017    No Known Allergies  Prior to Admission medications   Medication Sig Start Date End Date Taking? Authorizing Provider  acetaminophen (TYLENOL) 325 MG tablet Take 325 mg by mouth every 6 (six) hours as needed for moderate pain.   Yes [provider]  albuterol (PROVENTIL HFA;VENTOLIN HFA) 108 (90 Base) MCG/ACT inhaler Inhale 2 puffs into the lungs every 4 (four) hours as needed for wheezing or shortness of breath (cough, shortness of breath or wheezing.). 08/17/17  Yes Jegede, Olugbemiga E, MD  albuterol (PROVENTIL) (2.5 MG/3ML) 0.083% nebulizer solution Take 3 mLs (2.5 mg total) by nebulization every 6 (six) hours as needed for wheezing or shortness of breath. 08/02/17  Yes [name], FNP  amLODipine (NORVASC) 5 MG tablet Take 1 tablet (5 mg total) by mouth daily. 07/13/17  Yes [name], FNP  aspirin EC 81 MG tablet Take 1 tablet (81 mg total) by mouth daily. 07/08/17 07/08/18 Yes Florencia Reasons, MD  Dextromethorphan Polistirex (COUGH DM PO) Take 30 mLs by mouth daily as needed (cough).   Yes [provider]  HYDROcodone-homatropine (HYCODAN) 5-1.5 MG/5ML syrup Take 5 mLs by mouth every 8 (eight) hours as needed for cough. 07/27/17  Yes [name], FNP  ipratropium (ATROVENT) 0.02 % nebulizer solution Take 2.5 mLs (0.5 mg total) by nebulization 4 (four) times daily. 08/02/17  Yes [name], FNP  metoprolol tartrate (LOPRESSOR) 25 MG tablet Take 1 tablet (25 mg total) by mouth 2 (  two) times daily. 07/27/17  Yes [name], FNP  Multiple Vitamins-Minerals (DRY EYE FORMULA PO) Place 1 drop into both eyes daily.   Yes [provider]  Wound Cleansers (RADIAPLEX EX) Apply topically.   Yes [provider]  Fluticasone Furoate (ARNUITY ELLIPTA) 100 MCG/ACT AEPB Inhale 2 puffs into the lungs daily. 08/17/17   Tresa Garter, MD  furosemide (LASIX) 40 MG tablet Take 1 tablet (40 mg total) by mouth daily. 08/17/17 08/22/17  [name], FNP    Past Medical, Surgical Family and Social History reviewed and updated.    Objective:   Today's Vitals   08/17/17 1156  BP: 116/70  Pulse: 100  Resp: 14  Temp: 98.6 F (37 C)  TempSrc: Oral  SpO2: 92%  Weight: 157 lb 9.6 oz  (71.5 kg)  Height: 5\' 2"  (1.575 m)    Wt Readings from Last 3 Encounters:  08/17/17 157 lb 9.6 oz (71.5 kg)  08/02/17 160 lb 3.2 oz (72.7 kg)  07/30/17 160 lb 14.4 oz (73 kg)   Physical Exam  Constitutional: He is oriented to person, place, and time. He appears well-developed.  HENT:  Head: Normocephalic and atraumatic.  Cardiovascular: Normal rate, regular rhythm, normal heart sounds and intact distal pulses.   Pulmonary/Chest: Effort normal. No respiratory distress. He has wheezes. He exhibits no tenderness.  Musculoskeletal: Normal range of motion. He exhibits edema.  Non-pitting +3 edema bilateral lower legs and feet  Neurological: He is alert and oriented to person, place, and time.  Skin: Skin is warm. He is diaphoretic.  Psychiatric: He has a normal mood and affect. His behavior is normal. Judgment and thought content normal.   Assessment & Plan:  1. Bilateral lower extremity edema -Levi Brown reports acute onset of lower extremity swelling over 24 hours. This is a new problem and has never occurred in the past. He denies ingestions of foods high in sodium. He has no prior documented history of heart failure, however recent EKG was significant for LVH and bi atrial enlargement. No echocardiogram performed during last hospital admission. -Will trial Furosemide  40 mg x 5 days with KDUR 20 MEQ to take with each dose of Lasix.  -If no improvement, will consider extending furosemide, obtain echocardiogram and along with cardiology consult.  RTC: 08/23/2017 to follow-up on leg swelling   Carroll Sage. Kenton Kingfisher, MSN, FNP-C [facility]  [address] [place], [place] [address] [phone]

## 2017-08-17 NOTE — Telephone Encounter (Signed)
PRINTED FOR PASS PROGRAM 

## 2017-08-18 ENCOUNTER — Ambulatory Visit
Admission: RE | Admit: 2017-08-18 | Discharge: 2017-08-18 | Disposition: A | Discharge status: Home / Self Care | Payer: Medicaid Other | Source: Ambulatory Visit | Attending: Radiation Oncology | Admitting: Radiation Oncology

## 2017-08-18 ENCOUNTER — Telehealth: Payer: Self-pay | Admitting: Radiation Oncology

## 2017-08-18 ENCOUNTER — Other Ambulatory Visit: Payer: Self-pay | Admitting: Radiation Oncology

## 2017-08-18 DIAGNOSIS — K209 Esophagitis, unspecified without bleeding: Secondary | ICD-10-CM

## 2017-08-18 DIAGNOSIS — Z510 Encounter for antineoplastic radiation therapy: Secondary | ICD-10-CM | POA: Diagnosis not present

## 2017-08-18 MED ORDER — SUCRALFATE 1 G PO TABS: 1.0000 g | ORAL_TABLET | Freq: Three times a day (TID) | ORAL | 1 refills | Status: DC

## 2017-08-18 MED FILL — SUCRALFATE 1 GM TABLET: 1 | 30 days supply | Qty: 120 | Fill #0

## 2017-08-18 NOTE — Telephone Encounter (Signed)
Per Shona Simpson, PA-C called in Carafate to patient's pharmacy, [facility]. Phoned patient's cell to inform him. No answer. Left message. Phoned patient's home. No answer and no option to leave a message

## 2017-08-19 ENCOUNTER — Ambulatory Visit
Admission: RE | Admit: 2017-08-19 | Discharge: 2017-08-19 | Disposition: A | Discharge status: Home / Self Care | Payer: Medicaid Other | Source: Ambulatory Visit | Attending: Radiation Oncology | Admitting: Radiation Oncology

## 2017-08-19 DIAGNOSIS — Z510 Encounter for antineoplastic radiation therapy: Secondary | ICD-10-CM | POA: Diagnosis not present

## 2017-08-20 ENCOUNTER — Ambulatory Visit
Admission: RE | Admit: 2017-08-20 | Discharge: 2017-08-20 | Disposition: A | Discharge status: Home / Self Care | Payer: Medicaid Other | Source: Ambulatory Visit | Attending: Radiation Oncology | Admitting: Radiation Oncology

## 2017-08-20 DIAGNOSIS — Z510 Encounter for antineoplastic radiation therapy: Secondary | ICD-10-CM | POA: Diagnosis not present

## 2017-08-23 ENCOUNTER — Ambulatory Visit: Payer: Self-pay | Admitting: Hematology and Oncology

## 2017-08-23 ENCOUNTER — Ambulatory Visit (INDEPENDENT_AMBULATORY_CARE_PROVIDER_SITE_OTHER): Payer: Medicaid Other | Admitting: Family Medicine

## 2017-08-23 ENCOUNTER — Ambulatory Visit
Admission: RE | Admit: 2017-08-23 | Discharge: 2017-08-23 | Disposition: A | Discharge status: Home / Self Care | Payer: Medicaid Other | Source: Ambulatory Visit | Attending: Radiation Oncology | Admitting: Radiation Oncology

## 2017-08-23 ENCOUNTER — Encounter: Payer: Self-pay | Admitting: Family Medicine

## 2017-08-23 VITALS — BP 114/68 | HR 100 | Temp 98.7°F | Resp 14 | Ht 62.0 in | Wt 159.8 lb

## 2017-08-23 DIAGNOSIS — Z510 Encounter for antineoplastic radiation therapy: Secondary | ICD-10-CM | POA: Diagnosis not present

## 2017-08-23 DIAGNOSIS — G4701 Insomnia due to medical condition: Secondary | ICD-10-CM

## 2017-08-23 DIAGNOSIS — R110 Nausea: Secondary | ICD-10-CM

## 2017-08-23 DIAGNOSIS — R600 Localized edema: Secondary | ICD-10-CM | POA: Diagnosis not present

## 2017-08-23 MED ORDER — ONDANSETRON 4 MG PO TBDP: 4.0000 mg | ORAL_TABLET | Freq: Three times a day (TID) | ORAL | 0 refills | Status: AC | PRN

## 2017-08-23 MED ORDER — FUROSEMIDE 20 MG PO TABS: 20.0000 mg | ORAL_TABLET | Freq: Every day | ORAL | 1 refills | Status: AC | PRN

## 2017-08-23 MED ORDER — TRAZODONE HCL 50 MG PO TABS: 25.0000 mg | ORAL_TABLET | Freq: Every evening | ORAL | 3 refills | Status: AC | PRN

## 2017-08-23 MED ORDER — POTASSIUM CHLORIDE ER 10 MEQ PO TBCR: 10.0000 meq | EXTENDED_RELEASE_TABLET | Freq: Every day | ORAL | 0 refills | Status: DC

## 2017-08-23 MED FILL — traZODone HCL 50 MG TABS: 50 | 30 days supply | Qty: 30 | Fill #0

## 2017-08-23 MED FILL — FUROSEMIDE 20 MG TABLET: 20 | 30 days supply | Qty: 30 | Fill #0

## 2017-08-23 MED FILL — ONDANSETRON ODT 4 MG TABLET: 4 | 10 days supply | Qty: 30 | Fill #0

## 2017-08-23 MED FILL — POTASSIUM CL 10 MEQ TAB SA: 10 | 30 days supply | Qty: 30 | Fill #0

## 2017-08-23 NOTE — Progress Notes (Signed)
Patient ID: Levi Brown, male    DOB: March 16, 1956, 61 y.o.   MRN: [id number]  PCP: [name], FNP  Chief Complaint  Patient presents with  . Follow-up    Feet swelling    Subjective:  HPI  Levi Brown is a 60 y.o. male, with stage IV non-small mediastinal mass carcinoma presents today for evaluation of lower extremity swelling. Levi Brown original presented in office 08/17/2017 with idiopathic lower extremity swelling . He associates the swelling with possible increased sodium intake. He was recently newly diagnosed with hypertension which has remained controlled on current therapy. During his last office visit he was prescribed furosemide 40 mg once daily which he reports minimal relief combined with elevating legs. Marquee reports decreasing sodium intake although admits to eating sausage this morning for breakfast and noticing immediate lower extremity swelling. Continues to experience chronic shortness of breath and wheezing which improves with nebulizer treatments every 6 hours. He complains of problem sleeping due to anxiety about current health problems. He is requesting medication. Social History   Social History  . Marital status: Single    Spouse name: N/A  . Number of children: N/A  . Years of education: N/A   Occupational History  . Not on file.   Social History Main Topics  . Smoking status: Former Smoker    Packs/day: 1.00    Years: 30.00    Types: Cigarettes    Quit date: 2017  . Smokeless tobacco: Never Used     Comment: quit smoking in 2017  . Alcohol use 8.4 - 12.6 oz/week    14 - 21 Cans of beer per week  . Drug use: No  . Sexual activity: No   Other Topics Concern  . Not on file   Social History Narrative  . No narrative on file    Family History  Problem Relation Age of Onset  . Asthma Mother   . COPD Mother   . Cancer Mother        breast?   Review of Systems See HPI  Patient Active Problem List   Diagnosis Date Noted  .  Non-small cell cancer of left lung ([place]) 07/13/2017  . Elevated blood pressure reading 07/05/2017    No Known Allergies  Prior to Admission medications   Medication Sig Start Date End Date Taking? Authorizing Provider  acetaminophen (TYLENOL) 325 MG tablet Take 325 mg by mouth every 6 (six) hours as needed for moderate pain.   Yes [provider]  albuterol (PROVENTIL HFA;VENTOLIN HFA) 108 (90 Base) MCG/ACT inhaler Inhale 2 puffs into the lungs every 4 (four) hours as needed for wheezing or shortness of breath (cough, shortness of breath or wheezing.). 08/17/17  Yes Jegede, Olugbemiga E, MD  albuterol (PROVENTIL) (2.5 MG/3ML) 0.083% nebulizer solution Take 3 mLs (2.5 mg total) by nebulization every 6 (six) hours as needed for wheezing or shortness of breath. 08/02/17  Yes [name], FNP  amLODipine (NORVASC) 5 MG tablet Take 1 tablet (5 mg total) by mouth daily. 07/13/17  Yes [name], FNP  aspirin EC 81 MG tablet Take 1 tablet (81 mg total) by mouth daily. 07/08/17 07/08/18 Yes Florencia Reasons, MD  Dextromethorphan Polistirex (COUGH DM PO) Take 30 mLs by mouth daily as needed (cough).   Yes [provider]  Fluticasone Furoate (ARNUITY ELLIPTA) 100 MCG/ACT AEPB Inhale 2 puffs into the lungs daily. 08/17/17  Yes Tresa Garter, MD  HYDROcodone-homatropine (HYCODAN) 5-1.5 MG/5ML syrup Take  5 mLs by mouth every 8 (eight) hours as needed for cough. 07/27/17  Yes [name], FNP  ipratropium (ATROVENT) 0.02 % nebulizer solution Take 2.5 mLs (0.5 mg total) by nebulization 4 (four) times daily. 08/02/17  Yes [name], FNP  metoprolol tartrate (LOPRESSOR) 25 MG tablet Take 1 tablet (25 mg total) by mouth 2 (two) times daily. 07/27/17  Yes [name], FNP  Multiple Vitamins-Minerals (DRY EYE FORMULA PO) Place 1 drop into both eyes daily.   Yes [provider]  potassium chloride SA (K-DUR,KLOR-CON) 20 MEQ tablet Take 1 tablet (20 mEq total) by  mouth daily. 08/17/17  Yes [name], FNP  sucralfate (CARAFATE) 1 g tablet Take 1 tablet (1 g total) by mouth 4 (four) times daily -  with meals and at bedtime. Dissolve tablet in 15 cc of water and drink 15 minutes before meals and at bedtime. 08/18/17  Yes Hayden Pedro, PA-C  Wound Cleansers (RADIAPLEX EX) Apply topically.   Yes [provider]  furosemide (LASIX) 40 MG tablet Take 1 tablet (40 mg total) by mouth daily. 08/17/17 08/22/17  [name], FNP    Past Medical, Surgical Family and Social History reviewed and updated.    Objective:   Today's Vitals   08/23/17 1059  BP: 114/68  Pulse: 100  Resp: 14  Temp: 98.7 F (37.1 C)  TempSrc: Oral  SpO2: 92%  Weight: 159 lb 12.8 oz (72.5 kg)  Height: 5\' 2"  (1.575 m)    Wt Readings from Last 3 Encounters:  08/23/17 159 lb 12.8 oz (72.5 kg)  08/17/17 157 lb 9.6 oz (71.5 kg)  08/02/17 160 lb 3.2 oz (72.7 kg)   Physical Exam  Constitutional: He is oriented to person, place, and time. He appears well-developed and well-nourished.  HENT:  Head: Normocephalic and atraumatic.  Eyes: Pupils are equal, round, and reactive to light. Conjunctivae and EOM are normal.  Cardiovascular: Normal rate, regular rhythm, normal heart sounds and intact distal pulses.   Pulmonary/Chest: Effort normal. He has decreased breath sounds in the right upper field, the right middle field, the left upper field and the left middle field.  Chronic rales with breathing. No audible wheezing today   Musculoskeletal: He exhibits edema.  Bilateral lower foot swelling, non pitting +2, and left foot +3.   Neurological: He is alert and oriented to person, place, and time.  Skin: Skin is warm and dry.  Psychiatric: He has a normal mood and affect. His behavior is normal. Judgment and thought content normal.   Assessment & Plan:  1. Edema of foot Furosamide 20 mg, 1-2 times daily as needed for foot swelling Recommended compression socks  to aid with reducing fluid retention Adhere to low sodium diet    2. Insomnia due to medical condition Will trial trazodone 25-50 mg as needed sleep  3. Nausea -Zofran 4 mg every 8 hours as needed nausea   RTC: 6 weeks follow-up of symptoms or sooner if needed.   Carroll Sage. Kenton Kingfisher, MSN, FNP-C [facility]  [address] [place], [place] [address] [phone]

## 2017-08-23 NOTE — Patient Instructions (Addendum)
Take fursomide 20 mg as needed for lower extremity swelling and take 1 potassium 10 Meq tablet.   Zofran 4 mg ever 8 hours as needed nausea.  Trazodone 25-50 mg as needed for sleep.

## 2017-08-24 ENCOUNTER — Ambulatory Visit
Admission: RE | Admit: 2017-08-24 | Discharge: 2017-08-24 | Disposition: A | Discharge status: Home / Self Care | Payer: Medicaid Other | Source: Ambulatory Visit | Attending: Radiation Oncology | Admitting: Radiation Oncology

## 2017-08-24 DIAGNOSIS — Z510 Encounter for antineoplastic radiation therapy: Secondary | ICD-10-CM | POA: Diagnosis not present

## 2017-08-25 ENCOUNTER — Emergency Department (HOSPITAL_COMMUNITY): Payer: Medicaid Other

## 2017-08-25 ENCOUNTER — Telehealth: Payer: Self-pay | Admitting: Radiation Oncology

## 2017-08-25 ENCOUNTER — Inpatient Hospital Stay (HOSPITAL_COMMUNITY)
Admission: EM | Admit: 2017-08-25 | Discharge: 2017-09-04 | DRG: 208 | Disposition: A | Discharge status: Home / Self Care | Payer: Medicaid Other | Attending: Internal Medicine | Admitting: Internal Medicine

## 2017-08-25 ENCOUNTER — Other Ambulatory Visit: Payer: Self-pay | Admitting: Radiation Oncology

## 2017-08-25 ENCOUNTER — Ambulatory Visit: Payer: Medicaid Other

## 2017-08-25 ENCOUNTER — Encounter: Payer: Self-pay | Admitting: Radiation Oncology

## 2017-08-25 DIAGNOSIS — R609 Edema, unspecified: Secondary | ICD-10-CM

## 2017-08-25 DIAGNOSIS — Z9889 Other specified postprocedural states: Secondary | ICD-10-CM

## 2017-08-25 DIAGNOSIS — R000 Tachycardia, unspecified: Secondary | ICD-10-CM | POA: Diagnosis present

## 2017-08-25 DIAGNOSIS — C7951 Secondary malignant neoplasm of bone: Secondary | ICD-10-CM | POA: Diagnosis present

## 2017-08-25 DIAGNOSIS — E222 Syndrome of inappropriate secretion of antidiuretic hormone: Secondary | ICD-10-CM | POA: Diagnosis present

## 2017-08-25 DIAGNOSIS — J188 Other pneumonia, unspecified organism: Secondary | ICD-10-CM | POA: Diagnosis present

## 2017-08-25 DIAGNOSIS — J440 Chronic obstructive pulmonary disease with acute lower respiratory infection: Secondary | ICD-10-CM | POA: Diagnosis present

## 2017-08-25 DIAGNOSIS — J441 Chronic obstructive pulmonary disease with (acute) exacerbation: Secondary | ICD-10-CM | POA: Diagnosis present

## 2017-08-25 DIAGNOSIS — E8770 Fluid overload, unspecified: Secondary | ICD-10-CM | POA: Diagnosis present

## 2017-08-25 DIAGNOSIS — J189 Pneumonia, unspecified organism: Secondary | ICD-10-CM | POA: Diagnosis present

## 2017-08-25 DIAGNOSIS — Z8673 Personal history of transient ischemic attack (TIA), and cerebral infarction without residual deficits: Secondary | ICD-10-CM

## 2017-08-25 DIAGNOSIS — J9601 Acute respiratory failure with hypoxia: Secondary | ICD-10-CM | POA: Diagnosis present

## 2017-08-25 DIAGNOSIS — Z66 Do not resuscitate: Secondary | ICD-10-CM | POA: Diagnosis not present

## 2017-08-25 DIAGNOSIS — R42 Dizziness and giddiness: Secondary | ICD-10-CM | POA: Diagnosis not present

## 2017-08-25 DIAGNOSIS — Z79899 Other long term (current) drug therapy: Secondary | ICD-10-CM

## 2017-08-25 DIAGNOSIS — J90 Pleural effusion, not elsewhere classified: Secondary | ICD-10-CM | POA: Diagnosis present

## 2017-08-25 DIAGNOSIS — E876 Hypokalemia: Secondary | ICD-10-CM | POA: Diagnosis not present

## 2017-08-25 DIAGNOSIS — Q2547 Right aortic arch: Secondary | ICD-10-CM

## 2017-08-25 DIAGNOSIS — J9811 Atelectasis: Secondary | ICD-10-CM | POA: Diagnosis present

## 2017-08-25 DIAGNOSIS — Z7951 Long term (current) use of inhaled steroids: Secondary | ICD-10-CM

## 2017-08-25 DIAGNOSIS — E861 Hypovolemia: Secondary | ICD-10-CM | POA: Diagnosis present

## 2017-08-25 DIAGNOSIS — C3412 Malignant neoplasm of upper lobe, left bronchus or lung: Secondary | ICD-10-CM | POA: Diagnosis present

## 2017-08-25 DIAGNOSIS — R946 Abnormal results of thyroid function studies: Secondary | ICD-10-CM | POA: Diagnosis present

## 2017-08-25 DIAGNOSIS — C3492 Malignant neoplasm of unspecified part of left bronchus or lung: Principal | ICD-10-CM | POA: Diagnosis present

## 2017-08-25 DIAGNOSIS — Z87891 Personal history of nicotine dependence: Secondary | ICD-10-CM

## 2017-08-25 DIAGNOSIS — D649 Anemia, unspecified: Secondary | ICD-10-CM | POA: Diagnosis present

## 2017-08-25 DIAGNOSIS — E871 Hypo-osmolality and hyponatremia: Secondary | ICD-10-CM

## 2017-08-25 DIAGNOSIS — J9602 Acute respiratory failure with hypercapnia: Secondary | ICD-10-CM | POA: Diagnosis present

## 2017-08-25 HISTORY — DX: Cerebral infarction, unspecified: I63.9

## 2017-08-25 LAB — BLOOD GAS, ARTERIAL
ACID-BASE EXCESS: 10.1 mmol/L — AB (ref 0.0–2.0)
Acid-Base Excess: 9.2 mmol/L — ABNORMAL HIGH (ref 0.0–2.0)
BICARBONATE: 38 mmol/L — AB (ref 20.0–28.0)
BICARBONATE: 34.2 mmol/L — AB (ref 20.0–28.0)
Drawn by: 103701
Drawn by: 308601
FIO2: 100
LHR: 14 {breaths}/min
O2 CONTENT: 4 L/min
O2 SAT: 94.5 %
O2 Saturation: 100 %
PATIENT TEMPERATURE: 98.6
PEEP/CPAP: 5 cmH2O
Patient temperature: 98.6
VT: 560 mL
pCO2 arterial: 81.7 mmHg (ref 32.0–48.0)
pCO2 arterial: 53.7 mmHg — ABNORMAL HIGH (ref 32.0–48.0)
pH, Arterial: 7.289 — ABNORMAL LOW (ref 7.350–7.450)
pH, Arterial: 7.42 (ref 7.350–7.450)
pO2, Arterial: 91.1 mmHg (ref 83.0–108.0)
pO2, Arterial: 396 mmHg — ABNORMAL HIGH (ref 83.0–108.0)

## 2017-08-25 LAB — URINALYSIS, ROUTINE W REFLEX MICROSCOPIC
Bacteria, UA: NONE SEEN
Bilirubin Urine: NEGATIVE
Glucose, UA: NEGATIVE mg/dL
KETONES UR: NEGATIVE mg/dL
Leukocytes, UA: NEGATIVE
Nitrite: NEGATIVE
PROTEIN: NEGATIVE mg/dL
Specific Gravity, Urine: >1.046 — ABNORMAL HIGH (ref 1.005–1.030)
Squamous Epithelial / LPF: NONE SEEN
pH: 5 (ref 5.0–8.0)

## 2017-08-25 LAB — COMPREHENSIVE METABOLIC PANEL
ALBUMIN: 2.3 g/dL — AB (ref 3.5–5.0)
ALT: 72 U/L — ABNORMAL HIGH (ref 17–63)
AST: 25 U/L (ref 15–41)
Alkaline Phosphatase: 161 U/L — ABNORMAL HIGH (ref 38–126)
Anion gap: 9 (ref 5–15)
BUN: 6 mg/dL (ref 6–20)
CHLORIDE: 72 mmol/L — AB (ref 101–111)
CO2: 36 mmol/L — AB (ref 22–32)
Calcium: 8.4 mg/dL — ABNORMAL LOW (ref 8.9–10.3)
Creatinine, Ser: 0.4 mg/dL — ABNORMAL LOW (ref 0.61–1.24)
GFR calc Af Amer: >60 mL/min (ref >60)
GFR calc non Af Amer: >60 mL/min (ref >60)
GLUCOSE: 119 mg/dL — AB (ref 65–99)
POTASSIUM: 3.9 mmol/L (ref 3.5–5.1)
Sodium: 117 mmol/L — CL (ref 135–145)
Total Bilirubin: 0.2 mg/dL — ABNORMAL LOW (ref 0.3–1.2)
Total Protein: 6.5 g/dL (ref 6.5–8.1)

## 2017-08-25 LAB — BRAIN NATRIURETIC PEPTIDE: B Natriuretic Peptide: 219.2 pg/mL — ABNORMAL HIGH (ref 0.0–100.0)

## 2017-08-25 LAB — CBC WITH DIFFERENTIAL/PLATELET
Basophils Absolute: 0 10*3/uL (ref 0.0–0.1)
Basophils Relative: 0 %
Eosinophils Absolute: 0.1 10*3/uL (ref 0.0–0.7)
Eosinophils Relative: 1 %
HCT: 25 % — ABNORMAL LOW (ref 39.0–52.0)
Hemoglobin: 8.2 g/dL — ABNORMAL LOW (ref 13.0–17.0)
LYMPHS ABS: 0.6 10*3/uL — AB (ref 0.7–4.0)
Lymphocytes Relative: 5 %
MCH: 28.4 pg (ref 26.0–34.0)
MCHC: 32.8 g/dL (ref 30.0–36.0)
MCV: 86.5 fL (ref 78.0–100.0)
MONO ABS: 0.9 10*3/uL (ref 0.1–1.0)
Monocytes Relative: 9 %
Neutro Abs: 9 10*3/uL — ABNORMAL HIGH (ref 1.7–7.7)
Neutrophils Relative %: 85 %
PLATELETS: 668 10*3/uL — AB (ref 150–400)
RBC: 2.89 MIL/uL — AB (ref 4.22–5.81)
RDW: 15.8 % — AB (ref 11.5–15.5)
WBC: 10.6 10*3/uL — ABNORMAL HIGH (ref 4.0–10.5)

## 2017-08-25 LAB — I-STAT CG4 LACTIC ACID, ED: Lactic Acid, Venous: 0.97 mmol/L (ref 0.5–1.9)

## 2017-08-25 LAB — TROPONIN I: Troponin I: <0.03 ng/mL (ref <0.03)

## 2017-08-25 MED ORDER — PROPOFOL 1000 MG/100ML IV EMUL
INTRAVENOUS | Status: AC
  Filled 2017-08-25: qty 100

## 2017-08-25 MED ORDER — IOPAMIDOL (ISOVUE-370) INJECTION 76%
INTRAVENOUS | Status: AC
  Filled 2017-08-25: qty 100

## 2017-08-25 MED ORDER — IOPAMIDOL (ISOVUE-370) INJECTION 76%
100.0000 mL | Freq: Once | INTRAVENOUS | Status: AC | PRN
  Administered 2017-08-25: 100 mL via INTRAVENOUS

## 2017-08-25 MED ORDER — DEXTROSE 5 % IV SOLN
1.0000 g | Freq: Once | INTRAVENOUS | Status: AC
  Administered 2017-08-25: 1 g via INTRAVENOUS
  Filled 2017-08-25: qty 10

## 2017-08-25 MED ORDER — ETOMIDATE 2 MG/ML IV SOLN
INTRAVENOUS | Status: AC | PRN
  Administered 2017-08-25: 20 mg via INTRAVENOUS

## 2017-08-25 MED ORDER — PROPOFOL 1000 MG/100ML IV EMUL
5.0000 ug/kg/min | Freq: Once | INTRAVENOUS | Status: AC
  Administered 2017-08-25: 40 ug/kg/min via INTRAVENOUS

## 2017-08-25 MED ORDER — DEXTROSE 5 % IV SOLN
500.0000 mg | Freq: Once | INTRAVENOUS | Status: AC
  Administered 2017-08-25: 500 mg via INTRAVENOUS
  Filled 2017-08-25: qty 500

## 2017-08-25 MED ORDER — PROPOFOL 500 MG/50ML IV EMUL
INTRAVENOUS | Status: AC | PRN
  Administered 2017-08-25: 25 ug/kg/min via INTRAVENOUS

## 2017-08-25 MED ORDER — SODIUM CHLORIDE 0.9 % IV SOLN
INTRAVENOUS | Status: AC | PRN
  Administered 2017-08-25: 1000 mL via INTRAVENOUS

## 2017-08-25 MED ORDER — PROPOFOL 1000 MG/100ML IV EMUL
INTRAVENOUS | Status: AC
  Administered 2017-08-25: 40 ug/kg/min via INTRAVENOUS
  Filled 2017-08-25: qty 100

## 2017-08-25 MED ORDER — SUCCINYLCHOLINE CHLORIDE 20 MG/ML IJ SOLN
INTRAMUSCULAR | Status: AC | PRN
  Administered 2017-08-25: 100 mg via INTRAVENOUS

## 2017-08-25 NOTE — ED Notes (Signed)
Lab has just reported na+ of 117, which I in turn shared with Dr. Gilford Raid.

## 2017-08-25 NOTE — ED Provider Notes (Signed)
Levi Brown Provider Note   CSN: [id number] Arrival date & time: 08/25/17  1637     History   Chief Complaint Chief Complaint  Patient presents with  . Respiratory Distress    HPI Levi Brown is a 61 y.o. male.  Pt presents to the ED today with sob.  The pt said that he has noticed some swelling to both lower legs for the past week.  He did see his pcp last week and was started on lasix 40 mg daily.  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  The pt has been getting daily chemo as well as radiation treatment.  He presented to his doctor's office requesting help.  His O2 sat was 67%, so he was placed on oxygen and EMS was called.  Family said pt has been confused.  Pt unable to give a good history.  He said he has been sick.          Past Medical History:  Diagnosis Date  . Former smoker    Quit smoking in early 2017  . Non-small cell cancer of left lung ([place]) 07/13/2017    Patient Active Problem List   Diagnosis Date Noted  . Non-small cell cancer of left lung ([place]) 07/13/2017  . Elevated blood pressure reading 07/05/2017    Past Surgical History:  Procedure Laterality Date  . VIDEO BRONCHOSCOPY Bilateral 07/07/2017   Procedure: VIDEO BRONCHOSCOPY WITHOUT FLUORO;  Surgeon: Collene Gobble, MD;  Location: [facility] ENDOSCOPY;  Service: Cardiopulmonary;  Laterality: Bilateral;       Home Medications    Prior to Admission medications   Medication Sig Start Date End Date Taking? Authorizing Provider  albuterol (PROVENTIL) (2.5 MG/3ML) 0.083% nebulizer solution Take 3 mLs (2.5 mg total) by nebulization every 6 (six) hours as needed for wheezing or shortness of breath. 08/02/17  Yes [name], FNP  amLODipine (NORVASC) 5 MG tablet Take 1 tablet (5 mg total) by mouth daily. 07/13/17  Yes [name], FNP  aspirin EC 81 MG tablet Take 1 tablet (81 mg total) by mouth daily. 07/08/17 07/08/18 Yes Florencia Reasons, MD    furosemide (LASIX) 20 MG tablet Take 1 tablet (20 mg total) by mouth daily as needed. Patient taking differently: Take 20 mg by mouth daily as needed for fluid or edema.  08/23/17  Yes [name], FNP  ipratropium (ATROVENT) 0.02 % nebulizer solution Take 2.5 mLs (0.5 mg total) by nebulization 4 (four) times daily. 08/02/17  Yes [name], FNP  metoprolol tartrate (LOPRESSOR) 25 MG tablet Take 1 tablet (25 mg total) by mouth 2 (two) times daily. 07/27/17  Yes [name], FNP  Multiple Vitamins-Minerals (DRY EYE FORMULA PO) Place 1 drop into both eyes daily.   Yes [provider]  ondansetron (ZOFRAN ODT) 4 MG disintegrating tablet Take 1 tablet (4 mg total) by mouth every 8 (eight) hours as needed for nausea or vomiting. 08/23/17  Yes [name], FNP  potassium chloride (K-DUR) 10 MEQ tablet Take 1 tablet (10 mEq total) by mouth daily. 08/23/17  Yes [name], FNP  sucralfate (CARAFATE) 1 g tablet Take 1 tablet (1 g total) by mouth 4 (four) times daily -  with meals and at bedtime. Dissolve tablet in 15 cc of water and drink 15 minutes before meals and at bedtime. 08/18/17  Yes Hayden Pedro, PA-C  traZODone (DESYREL) 50 MG  tablet Take 0.5-1 tablets (25-50 mg total) by mouth at bedtime as needed for sleep. 08/23/17  Yes [name], FNP  acetaminophen (TYLENOL) 325 MG tablet Take 325 mg by mouth every 6 (six) hours as needed for moderate pain.    [provider]  albuterol (PROVENTIL HFA;VENTOLIN HFA) 108 (90 Base) MCG/ACT inhaler Inhale 2 puffs into the lungs every 4 (four) hours as needed for wheezing or shortness of breath (cough, shortness of breath or wheezing.). 08/17/17   Tresa Garter, MD  Dextromethorphan Polistirex (COUGH DM PO) Take 30 mLs by mouth daily as needed (cough).    [provider]  Fluticasone Furoate (ARNUITY ELLIPTA) 100 MCG/ACT AEPB Inhale 2 puffs into the lungs daily. 08/17/17   Tresa Garter, MD  HYDROcodone-homatropine (HYCODAN) 5-1.5 MG/5ML syrup Take 5 mLs by mouth every 8 (eight) hours as needed for cough. 07/27/17   [name], FNP  potassium chloride SA (K-DUR,KLOR-CON) 20 MEQ tablet Take 1 tablet (20 mEq total) by mouth daily. Patient not taking: Reported on 08/25/2017 08/17/17   [name], FNP  Wound Cleansers (RADIAPLEX EX) Apply topically.    [provider]    Family History Family History  Problem Relation Age of Onset  . Asthma Mother   . COPD Mother   . Cancer Mother        breast?    Social History Social History  Substance Use Topics  . Smoking status: Former Smoker    Packs/day: 1.00    Years: 30.00    Types: Cigarettes    Quit date: 2017  . Smokeless tobacco: Never Used     Comment: quit smoking in 2017  . Alcohol use 8.4 - 12.6 oz/week    14 - 21 Cans of beer per week     Allergies   Patient has no known allergies.   Review of Systems Review of Systems  Respiratory: Positive for shortness of breath.   Cardiovascular: Positive for leg swelling.  All other systems reviewed and are negative.    Physical Exam Updated Vital Signs BP 103/74   Pulse 88   Temp 98.4 F (36.9 C) (Oral)   Resp (!) 25   Ht 5\' 5"  (1.651 m)   Wt 71.7 kg (158 lb)   SpO2 100%   BMI 26.29 kg/m   Physical Exam  Constitutional: He appears well-developed and well-nourished. He appears distressed.  HENT:  Head: Normocephalic and atraumatic.  Right Ear: External ear normal.  Left Ear: External ear normal.  Nose: Nose normal.  Mouth/Throat: Oropharynx is clear and moist.  Eyes: Pupils are equal, round, and reactive to light. Conjunctivae and EOM are normal.  Neck: Normal range of motion. Neck supple.  Cardiovascular: Regular rhythm, normal heart sounds and intact distal pulses.  Tachycardia present.   Pulmonary/Chest: He is in respiratory distress. He has wheezes. He has rales.  Abdominal: Soft. Bowel sounds are normal.    Musculoskeletal: Normal range of motion. He exhibits edema.  3+ pitting edema bilaterally  Neurological: He is alert.  Skin: Skin is warm.  Psychiatric: He has a normal mood and affect. His behavior is normal. Judgment and thought content normal.  Nursing note and vitals reviewed.    ED Treatments / Results  Labs (all labs ordered are listed, but only abnormal results are displayed) Labs Reviewed  COMPREHENSIVE METABOLIC PANEL - Abnormal; Notable for the following:       Result Value   Sodium 117 (*)  Chloride 72 (*)    CO2 36 (*)    Glucose, Bld 119 (*)    Creatinine, Ser 0.40 (*)    Calcium 8.4 (*)    Albumin 2.3 (*)    ALT 72 (*)    Alkaline Phosphatase 161 (*)    Total Bilirubin 0.2 (*)    All other components within normal limits  CBC WITH DIFFERENTIAL/PLATELET - Abnormal; Notable for the following:    WBC 10.6 (*)    RBC 2.89 (*)    Hemoglobin 8.2 (*)    HCT 25.0 (*)    RDW 15.8 (*)    Platelets 668 (*)    Neutro Abs 9.0 (*)    Lymphs Abs 0.6 (*)    All other components within normal limits  BRAIN NATRIURETIC PEPTIDE - Abnormal; Notable for the following:    B Natriuretic Peptide 219.2 (*)    All other components within normal limits  BLOOD GAS, ARTERIAL - Abnormal; Notable for the following:    pH, Arterial 7.289 (*)    pCO2 arterial 81.7 (*)    Bicarbonate 38.0 (*)    Acid-Base Excess 10.1 (*)    All other components within normal limits  BLOOD GAS, ARTERIAL - Abnormal; Notable for the following:    pCO2 arterial 53.7 (*)    pO2, Arterial 396 (*)    Bicarbonate 34.2 (*)    Acid-Base Excess 9.2 (*)    All other components within normal limits  URINALYSIS, ROUTINE W REFLEX MICROSCOPIC - Abnormal; Notable for the following:    Specific Gravity, Urine >1.046 (*)    Hgb urine dipstick SMALL (*)    All other components within normal limits  CULTURE, BLOOD (ROUTINE X 2)  CULTURE, BLOOD (ROUTINE X 2)  TROPONIN I  I-STAT CG4 LACTIC ACID, ED  I-STAT CG4  LACTIC ACID, ED    EKG  EKG Interpretation  Date/Time:  Wednesday August 25 2017 19:23:53 EDT Ventricular Rate:  119 PR Interval:    QRS Duration: 84 QT Interval:  318 QTC Calculation: 448 R Axis:   61 Text Interpretation:  Sinus tachycardia LAE, consider biatrial enlargement Abnormal R-wave progression, early transition Minimal ST elevation, inferior leads Confirmed by Isla Pence [id number]) on 08/25/2017 7:29:52 PM       Radiology Ct Angio Chest Pe W Or Wo Contrast  Result Date: 08/25/2017 CLINICAL DATA:  Dyspnea, orthostasis, confusion and tachycardia. Hypoxia with pulse oximeter 67%. Patient canceled radiation therapy today due to progressive weakness. Has not started chemotherapy. EXAM: CT ANGIOGRAPHY CHEST WITH CONTRAST TECHNIQUE: Multidetector CT imaging of the chest was performed using the standard protocol during bolus administration of intravenous contrast. Multiplanar CT image reconstructions and MIPs were obtained to evaluate the vascular anatomy. CONTRAST:  100 cc Isovue 370 IV COMPARISON:  None. FINDINGS: Cardiovascular: Right-sided aortic arch with aberrant left subclavian artery. No acute pulmonary embolus. Marked narrowing of the left main pulmonary artery secondary to previously described mediastinal soft tissue mass. No aortic aneurysm or dissection. Heart size is normal without pericardial effusion. Mediastinum/Nodes: The previously described soft tissue mass within the superior mediastinal causing mass effect on the left main pulmonary artery, obliterating the left upper lobe bronchus and impinging upon the left upper lobe bronchus is less distinct due to adjacent new left upper lobe atelectasis likely secondary to postobstructive involvement of the left upper lobe bronchus Lungs/Pleura: New moderate left effusion with new left lower lobe compressive atelectasis. Postobstructive collapse of the left upper lobe is more expensive since prior  exam with only a scant amount of  aerated lingula remaining. The contralateral right lung remains aerated without pneumonic consolidation or effusion. No pneumothorax. Upper Abdomen: No acute abnormality. Musculoskeletal: No chest wall abnormality. No acute or significant osseous findings. Review of the MIP images confirms the above findings. IMPRESSION: 1. New moderate left-sided pleural effusion with left lower lobe compressive atelectasis. Only a scant amount of aerated lung remains within the left hemithorax. 2. Further progression of left upper lobe collapse likely postobstructive in etiology secondary to previously described mediastinal soft tissue mass. Where the margins of this mass ends and the beginning of atelectatic lung begins is difficult to determine due to similar soft tissue attenuation of these abutting abnormalities. This mass is again noted to obstruct the left upper lobe bronchus, markedly narrowing the left main pulmonary artery and partially narrow the left lower lobe bronchus. 3. Right-sided aortic arch with aberrant left subclavian artery. Electronically Signed   By: Ashley Royalty M.D.   On: 08/25/2017 21:37   Dg Chest Portable 1 View  Result Date: 08/25/2017 CLINICAL DATA:  Intubation and NG tube placement. EXAM: PORTABLE CHEST 1 VIEW COMPARISON:  Radiographs and chest CT earlier this day. FINDINGS: Endotracheal tube tip 5.2 cm from the carina. Enteric tube in place, and tip below the diaphragm not included in the field of view. Again seen volume loss in the left hemithorax with left pleural effusion. Right paratracheal density corresponds to right aortic arch. Right lung is clear. IMPRESSION: 1. Endotracheal tube tip 5.2 cm from the carina. Enteric tube in place, tip below the diaphragm not included in the field of view. 2. Unchanged left lung volume loss and left pleural effusion. Mediastinal mass evaluated on chest CT earlier this day. Electronically Signed   By: Jeb Levering M.D.   On: 08/25/2017 22:32   Dg  Chest Port 1 View  Result Date: 08/25/2017 CLINICAL DATA:  Shortness of breath. History of LEFT lung cancer, RIGHT-sided aortic arch with vascular loop. EXAM: PORTABLE CHEST 1 VIEW COMPARISON:  PET-CT July 29, 2017 FINDINGS: Elevated LEFT hemidiaphragm, LEFT lung volume loss with perihilar consolidation with air bronchograms. Small LEFT pleural effusion. RIGHT lung is clear. The cardiac silhouette is mildly enlarged, limited assessment due to LEFT process. No pneumothorax. Soft tissue planes and included osseous structures are nonsuspicious, known osseous metastasis not apparent by plain radiography. IMPRESSION: Worsening LEFT lung atelectasis and consolidation/pneumonia, with underlying known LEFT lung mass. Small LEFT pleural effusion. Electronically Signed   By: Elon Alas M.D.   On: 08/25/2017 19:08    Procedures Procedures (including critical care time)  Medications Ordered in ED Medications  cefTRIAXone (ROCEPHIN) 1 g in dextrose 5 % 50 mL IVPB (not administered)  azithromycin (ZITHROMAX) 500 mg in dextrose 5 % 250 mL IVPB (500 mg Intravenous New Bag/Given 08/25/17 2248)  iopamidol (ISOVUE-370) 76 % injection 100 mL (100 mLs Intravenous Contrast Given 08/25/17 2058)  propofol (DIPRIVAN) 1000 MG/100ML infusion (40 mcg/kg/min  72.5 kg Intravenous New Bag/Given 08/25/17 2145)  succinylcholine (ANECTINE) injection (100 mg Intravenous Given 08/25/17 2138)  etomidate (AMIDATE) injection (20 mg Intravenous Given 08/25/17 2138)  0.9 %  sodium chloride infusion (1,000 mLs Intravenous New Bag/Given 08/25/17 2136)  propofol (DIPRIVAN) 500 MG/50ML infusion (25 mcg/kg/min  72.5 kg Intravenous New Bag/Given 08/25/17 2145)     Initial Impression / Assessment and Plan / ED Course  I have reviewed the triage vital signs and the nursing notes.  Pertinent labs & imaging results that were available during  my care of the patient were reviewed by me and considered in my medical decision making (see chart  for details).   Pt's CT scan took awhile to obtain as respiratory was busy upstairs and could not come with pt as he was on bipap.  After CT scan, pt acutely decompensated and looked much worse.  He is much more confused and extremely tachypneic and tachycardic.  Decision made to intubate.  As of now, pt is a full code.  On the ventilator, pt has done well.  Pt d/w Dr. Jimmy Footman (ICU) for admission.  CRITICAL CARE Performed by: Isla Pence   Total critical care time: 60 minutes  Critical care time was exclusive of separately billable procedures and treating other patients.  Critical care was necessary to treat or prevent imminent or life-threatening deterioration.  Critical care was time spent personally by me on the following activities: development of treatment plan with patient and/or surrogate as well as nursing, discussions with consultants, evaluation of patient's response to treatment, examination of patient, obtaining history from patient or surrogate, ordering and performing treatments and interventions, ordering and review of laboratory studies, ordering and review of radiographic studies, pulse oximetry and re-evaluation of patient's condition.  Final Clinical Impressions(s) / ED Diagnoses   Final diagnoses:  Acute respiratory failure with hypoxia and hypercapnia (HCC)  Hyponatremia  SIADH (syndrome of inappropriate ADH production) (HCC)  Non-small cell cancer of left lung (HCC)  Pleural effusion on left    New Prescriptions New Prescriptions   No medications on file     Isla Pence, MD 08/25/17 2347

## 2017-08-25 NOTE — Progress Notes (Signed)
Pt transported to and from CT on BIPAP without complication.  RT to monitor and assess as needed.

## 2017-08-25 NOTE — Progress Notes (Signed)
Mr. Levi Brown came in for evaluation after no showing today for his radiation. He is currently receiving XRT for stage IV lung cancer. We were originally planning concurrent chemoRT but he was not a candidate for concurrent chemo. The plan is for this to follow radiotherapy. He has completed 18/33 fractions to date to the LUL tumor. He complains of shortness of breath, night time confusion, and orthopnea. He denies chest pain, known fevers, or trouble with eating, but states he has no appetite. He denies dysphagia.    Nursing has assessed him and vitals are: Wt Readings from Last 3 Encounters:  08/23/17 159 lb 12.8 oz (72.5 kg)  08/17/17 157 lb 9.6 oz (71.5 kg)  08/02/17 160 lb 3.2 oz (72.7 kg)   Temp Readings from Last 3 Encounters:  08/25/17 98.1 F (36.7 C) (Oral)  08/23/17 98.7 F (37.1 C) (Oral)  08/17/17 98.6 F (37 C) (Oral)   BP Readings from Last 3 Encounters:  08/25/17 101/70  08/23/17 114/68  08/17/17 116/70   Pulse Readings from Last 3 Encounters:  08/25/17 (!) 132  08/23/17 100  08/17/17 100   O2 on presentation was 67% on room and with 3L [place] has gone up to 100%.   In general this is an acutely ill patient who appears to be in respiratory failure. He is alert and oriented x4 and appropriate throughout the examination. HEENT reveals that the patient is normocephalic, atraumatic. EOMs are intact. PERRLA. Oropharynx has early changes consistent with thrush posteriorly along the left side. Skin is intact without any evidence of gross lesions.  Cardiovascular exam reveals a tachycardic rate and normal rhythm, no clicks rubs or murmurs are auscultated. Chest reveals decreased breath sounds at the right lung base, and on the left, shallow breathing is noted, but only 1/2 way up the chest wall.  Impression/Plan: 1. Probable respiratory failure secondary to Stage IV, NSCLC, squamous cell carcinoma of the left upper lobe. The patient is clearly needing emergent evaluation to rule out  acute events. I've spoke with Dr. Rex Kras and she's aware of his case and will anticipate his arrival.  2. Code Status. We discussed the patient's desires to fell better and be made well from this acute hypoxic episode however he would not want to be intubated, receive CPR, or defibrillation. We will share this with the ED team as well.      Carola Rhine, PAC

## 2017-08-25 NOTE — Progress Notes (Signed)
Patient presented to the nursing clinic without an appointment requesting "help." Patient proves to be orthostatic. Tachycardia noted. Pulse ox 67%. Denies pain. Confusion noted. Patient cancelled radiation today because he is "getting weaker and weaker." Reports dyspnea. Thrush noted oral mucosa. Reports he can't sleep. Reports he is unable to lay flat due to dyspnea. Denies headache or dizziness. Reports feeling light headed. Reports an unsteady gait. Reports he hasn't started chemotherapy yet. Evaluated by Shona Simpson, PA-C. Per Bryson Ha transported patient to emergency room for evaluation.

## 2017-08-25 NOTE — ED Provider Notes (Signed)
INTUBATION Performed by: Antonietta Breach  Required items: required blood products, implants, devices, and special equipment available Patient identity confirmed: provided demographic data and hospital-assigned identification number Time out: Immediately prior to procedure a "time out" was called to verify the correct patient, procedure, equipment, support staff and site/side marked as required.  Indications: hypoxia and respiratory distress  Intubation method: Glidescope Laryngoscopy   Preoxygenation: BiPAP  Sedatives: 20Etomidate Paralytic: 100Succinylcholine  Tube Size: 7.5 cuffed  Post-procedure assessment: chest rise and ETCO2 monitor Breath sounds: equal and absent over the epigastrium Tube secured with: ETT holder Chest x-ray interpreted by radiologist and me.  Chest x-ray findings: Endotracheal tube in appropriate position  Patient tolerated the procedure well with no immediate complications.      Antonietta Breach, PA-C 08/25/17 2227    Isla Pence, MD 08/25/17 2326

## 2017-08-25 NOTE — ED Notes (Signed)
Bed: WA21 Expected date:  Expected time:  Means of arrival:  Comments: Cancer pt

## 2017-08-25 NOTE — ED Triage Notes (Signed)
Pt sent here from cancer center for low RA O2 sats (67%). Pt presents on 3L [place] 96%

## 2017-08-26 ENCOUNTER — Encounter (HOSPITAL_COMMUNITY): Payer: Self-pay

## 2017-08-26 ENCOUNTER — Inpatient Hospital Stay (HOSPITAL_COMMUNITY): Payer: Medicaid Other

## 2017-08-26 ENCOUNTER — Ambulatory Visit: Payer: Medicaid Other

## 2017-08-26 DIAGNOSIS — C7951 Secondary malignant neoplasm of bone: Secondary | ICD-10-CM | POA: Diagnosis present

## 2017-08-26 DIAGNOSIS — R600 Localized edema: Secondary | ICD-10-CM | POA: Diagnosis not present

## 2017-08-26 DIAGNOSIS — J9601 Acute respiratory failure with hypoxia: Secondary | ICD-10-CM | POA: Diagnosis present

## 2017-08-26 DIAGNOSIS — J90 Pleural effusion, not elsewhere classified: Secondary | ICD-10-CM | POA: Diagnosis present

## 2017-08-26 DIAGNOSIS — J441 Chronic obstructive pulmonary disease with (acute) exacerbation: Secondary | ICD-10-CM | POA: Diagnosis present

## 2017-08-26 DIAGNOSIS — E222 Syndrome of inappropriate secretion of antidiuretic hormone: Secondary | ICD-10-CM | POA: Diagnosis present

## 2017-08-26 DIAGNOSIS — Z66 Do not resuscitate: Secondary | ICD-10-CM | POA: Diagnosis not present

## 2017-08-26 DIAGNOSIS — C3492 Malignant neoplasm of unspecified part of left bronchus or lung: Secondary | ICD-10-CM | POA: Diagnosis present

## 2017-08-26 DIAGNOSIS — J9602 Acute respiratory failure with hypercapnia: Secondary | ICD-10-CM

## 2017-08-26 DIAGNOSIS — J189 Pneumonia, unspecified organism: Secondary | ICD-10-CM | POA: Diagnosis present

## 2017-08-26 DIAGNOSIS — Z8673 Personal history of transient ischemic attack (TIA), and cerebral infarction without residual deficits: Secondary | ICD-10-CM | POA: Diagnosis not present

## 2017-08-26 DIAGNOSIS — E876 Hypokalemia: Secondary | ICD-10-CM | POA: Diagnosis not present

## 2017-08-26 DIAGNOSIS — D649 Anemia, unspecified: Secondary | ICD-10-CM | POA: Diagnosis present

## 2017-08-26 DIAGNOSIS — E871 Hypo-osmolality and hyponatremia: Secondary | ICD-10-CM | POA: Diagnosis not present

## 2017-08-26 DIAGNOSIS — Z79899 Other long term (current) drug therapy: Secondary | ICD-10-CM | POA: Diagnosis not present

## 2017-08-26 DIAGNOSIS — R946 Abnormal results of thyroid function studies: Secondary | ICD-10-CM | POA: Diagnosis present

## 2017-08-26 DIAGNOSIS — R609 Edema, unspecified: Secondary | ICD-10-CM | POA: Diagnosis not present

## 2017-08-26 DIAGNOSIS — Z9889 Other specified postprocedural states: Secondary | ICD-10-CM | POA: Diagnosis not present

## 2017-08-26 DIAGNOSIS — C3490 Malignant neoplasm of unspecified part of unspecified bronchus or lung: Secondary | ICD-10-CM | POA: Insufficient documentation

## 2017-08-26 DIAGNOSIS — R42 Dizziness and giddiness: Secondary | ICD-10-CM | POA: Diagnosis not present

## 2017-08-26 DIAGNOSIS — J9811 Atelectasis: Secondary | ICD-10-CM | POA: Diagnosis present

## 2017-08-26 DIAGNOSIS — E861 Hypovolemia: Secondary | ICD-10-CM | POA: Diagnosis present

## 2017-08-26 DIAGNOSIS — R000 Tachycardia, unspecified: Secondary | ICD-10-CM | POA: Diagnosis present

## 2017-08-26 DIAGNOSIS — Z7951 Long term (current) use of inhaled steroids: Secondary | ICD-10-CM | POA: Diagnosis not present

## 2017-08-26 DIAGNOSIS — Q2547 Right aortic arch: Secondary | ICD-10-CM | POA: Diagnosis not present

## 2017-08-26 DIAGNOSIS — Z87891 Personal history of nicotine dependence: Secondary | ICD-10-CM | POA: Diagnosis not present

## 2017-08-26 DIAGNOSIS — E8770 Fluid overload, unspecified: Secondary | ICD-10-CM | POA: Diagnosis present

## 2017-08-26 DIAGNOSIS — J188 Other pneumonia, unspecified organism: Secondary | ICD-10-CM | POA: Diagnosis present

## 2017-08-26 DIAGNOSIS — J440 Chronic obstructive pulmonary disease with acute lower respiratory infection: Secondary | ICD-10-CM | POA: Diagnosis present

## 2017-08-26 LAB — LACTATE DEHYDROGENASE, PLEURAL OR PERITONEAL FLUID: LD FL: 71 U/L — AB (ref 3–23)

## 2017-08-26 LAB — BASIC METABOLIC PANEL
ANION GAP: 8 (ref 5–15)
ANION GAP: 8 (ref 5–15)
Anion gap: 10 (ref 5–15)
Anion gap: 9 (ref 5–15)
BUN: <5 mg/dL — ABNORMAL LOW (ref 6–20)
BUN: <5 mg/dL — ABNORMAL LOW (ref 6–20)
BUN: <5 mg/dL — ABNORMAL LOW (ref 6–20)
BUN: <5 mg/dL — ABNORMAL LOW (ref 6–20)
CALCIUM: 8.3 mg/dL — AB (ref 8.9–10.3)
CALCIUM: 8.9 mg/dL (ref 8.9–10.3)
CALCIUM: 8.8 mg/dL — AB (ref 8.9–10.3)
CHLORIDE: 83 mmol/L — AB (ref 101–111)
CHLORIDE: 82 mmol/L — AB (ref 101–111)
CO2: 34 mmol/L — ABNORMAL HIGH (ref 22–32)
CO2: 34 mmol/L — ABNORMAL HIGH (ref 22–32)
CO2: 35 mmol/L — ABNORMAL HIGH (ref 22–32)
CO2: 35 mmol/L — ABNORMAL HIGH (ref 22–32)
CREATININE: 0.36 mg/dL — AB (ref 0.61–1.24)
CREATININE: 0.5 mg/dL — AB (ref 0.61–1.24)
Calcium: 8.1 mg/dL — ABNORMAL LOW (ref 8.9–10.3)
Chloride: 86 mmol/L — ABNORMAL LOW (ref 101–111)
Chloride: 89 mmol/L — ABNORMAL LOW (ref 101–111)
Creatinine, Ser: 0.56 mg/dL — ABNORMAL LOW (ref 0.61–1.24)
Creatinine, Ser: 0.63 mg/dL (ref 0.61–1.24)
GFR calc non Af Amer: >60 mL/min (ref >60)
GFR calc non Af Amer: >60 mL/min (ref >60)
GFR calc non Af Amer: >60 mL/min (ref >60)
Glucose, Bld: 112 mg/dL — ABNORMAL HIGH (ref 65–99)
Glucose, Bld: 113 mg/dL — ABNORMAL HIGH (ref 65–99)
Glucose, Bld: 135 mg/dL — ABNORMAL HIGH (ref 65–99)
Glucose, Bld: 149 mg/dL — ABNORMAL HIGH (ref 65–99)
Potassium: 4.3 mmol/L (ref 3.5–5.1)
Potassium: 3.7 mmol/L (ref 3.5–5.1)
Potassium: 3.5 mmol/L (ref 3.5–5.1)
Potassium: 3.6 mmol/L (ref 3.5–5.1)
SODIUM: 125 mmol/L — AB (ref 135–145)
SODIUM: 124 mmol/L — AB (ref 135–145)
SODIUM: 131 mmol/L — AB (ref 135–145)
SODIUM: 133 mmol/L — AB (ref 135–145)

## 2017-08-26 LAB — BODY FLUID CELL COUNT WITH DIFFERENTIAL
Lymphs, Fluid: 22 %
MONOCYTE-MACROPHAGE-SEROUS FLUID: 22 % — AB (ref 50–90)
Neutrophil Count, Fluid: 56 % — ABNORMAL HIGH (ref 0–25)
WBC FLUID: 414 uL (ref 0–1000)

## 2017-08-26 LAB — ALBUMIN, PLEURAL OR PERITONEAL FLUID: ALBUMIN FL: 1.4 g/dL

## 2017-08-26 LAB — MRSA PCR SCREENING: MRSA BY PCR: NEGATIVE

## 2017-08-26 LAB — PROTEIN, PLEURAL OR PERITONEAL FLUID: TOTAL PROTEIN, FLUID: 3.2 g/dL

## 2017-08-26 LAB — GLUCOSE, PLEURAL OR PERITONEAL FLUID: Glucose, Fluid: 123 mg/dL

## 2017-08-26 LAB — APTT: APTT: 34 s (ref 24–36)

## 2017-08-26 LAB — OSMOLALITY: Osmolality: 254 mOsm/kg — ABNORMAL LOW (ref 275–295)

## 2017-08-26 LAB — PROTEIN, TOTAL: TOTAL PROTEIN: 6.6 g/dL (ref 6.5–8.1)

## 2017-08-26 LAB — ALBUMIN: ALBUMIN: 2.2 g/dL — AB (ref 3.5–5.0)

## 2017-08-26 LAB — TRIGLYCERIDES: Triglycerides: 63 mg/dL (ref <150)

## 2017-08-26 LAB — PROTIME-INR
INR: 1.17
Prothrombin Time: 14.8 seconds (ref 11.4–15.2)

## 2017-08-26 LAB — LACTATE DEHYDROGENASE: LDH: 112 U/L (ref 98–192)

## 2017-08-26 LAB — OSMOLALITY, URINE: Osmolality, Ur: 457 mOsm/kg (ref 300–900)

## 2017-08-26 LAB — SODIUM, URINE, RANDOM

## 2017-08-26 MED ORDER — PIPERACILLIN-TAZOBACTAM 3.375 G IVPB 30 MIN
3.3750 g | Freq: Once | INTRAVENOUS | Status: AC
  Administered 2017-08-26: 3.375 g via INTRAVENOUS
  Filled 2017-08-26: qty 50

## 2017-08-26 MED ORDER — SODIUM CHLORIDE 0.9 % IV SOLN
INTRAVENOUS | Status: DC
  Administered 2017-08-26: 100 mL via INTRAVENOUS
  Administered 2017-08-26 – 2017-08-31 (×9): via INTRAVENOUS

## 2017-08-26 MED ORDER — VANCOMYCIN HCL IN DEXTROSE 1-5 GM/200ML-% IV SOLN
1000.0000 mg | Freq: Once | INTRAVENOUS | Status: AC
  Administered 2017-08-26: 1000 mg via INTRAVENOUS
  Filled 2017-08-26: qty 200

## 2017-08-26 MED ORDER — PIPERACILLIN-TAZOBACTAM 3.375 G IVPB
3.3750 g | Freq: Three times a day (TID) | INTRAVENOUS | Status: DC
  Administered 2017-08-26 – 2017-09-02 (×22): 3.375 g via INTRAVENOUS
  Filled 2017-08-26 (×24): qty 50

## 2017-08-26 MED ORDER — FENTANYL CITRATE (PF) 100 MCG/2ML IJ SOLN
12.5000 ug | INTRAMUSCULAR | Status: DC | PRN
  Administered 2017-09-03: 25 ug via INTRAVENOUS
  Filled 2017-08-26: qty 2

## 2017-08-26 MED ORDER — SODIUM CHLORIDE 0.9 % IV SOLN: 250.0000 mL | INTRAVENOUS | Status: DC | PRN

## 2017-08-26 MED ORDER — PROPOFOL 1000 MG/100ML IV EMUL
0.0000 ug/kg/min | INTRAVENOUS | Status: DC
  Administered 2017-08-26: 20 ug/kg/min via INTRAVENOUS
  Administered 2017-08-26: 30 ug/kg/min via INTRAVENOUS
  Filled 2017-08-26: qty 100

## 2017-08-26 MED ORDER — CHLORHEXIDINE GLUCONATE 0.12% ORAL RINSE (MEDLINE KIT)
15.0000 mL | Freq: Two times a day (BID) | OROMUCOSAL | Status: DC
  Administered 2017-08-26: 15 mL via OROMUCOSAL

## 2017-08-26 MED ORDER — HEPARIN SODIUM (PORCINE) 5000 UNIT/ML IJ SOLN
5000.0000 [IU] | Freq: Three times a day (TID) | INTRAMUSCULAR | Status: DC
  Administered 2017-08-26 – 2017-09-03 (×24): 5000 [IU] via SUBCUTANEOUS
  Filled 2017-08-26 (×23): qty 1

## 2017-08-26 MED ORDER — VANCOMYCIN HCL IN DEXTROSE 750-5 MG/150ML-% IV SOLN
750.0000 mg | Freq: Three times a day (TID) | INTRAVENOUS | Status: DC
  Administered 2017-08-26: 750 mg via INTRAVENOUS
  Filled 2017-08-26: qty 150

## 2017-08-26 MED ORDER — FENTANYL CITRATE (PF) 100 MCG/2ML IJ SOLN: 100.0000 ug | INTRAMUSCULAR | Status: DC | PRN

## 2017-08-26 MED ORDER — PANTOPRAZOLE SODIUM 40 MG IV SOLR
40.0000 mg | Freq: Every day | INTRAVENOUS | Status: DC
  Administered 2017-08-26 – 2017-08-27 (×3): 40 mg via INTRAVENOUS
  Filled 2017-08-26 (×3): qty 40

## 2017-08-26 MED ORDER — DOCUSATE SODIUM 50 MG/5ML PO LIQD
100.0000 mg | Freq: Two times a day (BID) | ORAL | Status: DC | PRN
  Filled 2017-08-26: qty 10

## 2017-08-26 MED ORDER — CHLORHEXIDINE GLUCONATE 0.12 % MT SOLN
15.0000 mL | Freq: Two times a day (BID) | OROMUCOSAL | Status: DC
  Administered 2017-08-26 – 2017-09-04 (×18): 15 mL via OROMUCOSAL
  Filled 2017-08-26 (×17): qty 15

## 2017-08-26 MED ORDER — METHYLPREDNISOLONE SODIUM SUCC 40 MG IJ SOLR
40.0000 mg | Freq: Four times a day (QID) | INTRAMUSCULAR | Status: DC
  Administered 2017-08-26 (×2): 40 mg via INTRAVENOUS
  Filled 2017-08-26 (×2): qty 1

## 2017-08-26 MED ORDER — ORAL CARE MOUTH RINSE: 15.0000 mL | Freq: Four times a day (QID) | OROMUCOSAL | Status: DC

## 2017-08-26 MED ORDER — ORAL CARE MOUTH RINSE
15.0000 mL | Freq: Two times a day (BID) | OROMUCOSAL | Status: DC
  Administered 2017-08-27 – 2017-09-04 (×11): 15 mL via OROMUCOSAL

## 2017-08-26 MED ORDER — VANCOMYCIN HCL IN DEXTROSE 1-5 GM/200ML-% IV SOLN
1000.0000 mg | Freq: Two times a day (BID) | INTRAVENOUS | Status: DC
  Administered 2017-08-26 – 2017-08-27 (×3): 1000 mg via INTRAVENOUS
  Filled 2017-08-26 (×4): qty 200

## 2017-08-26 MED ORDER — ONDANSETRON HCL 4 MG/2ML IJ SOLN: 4.0000 mg | Freq: Four times a day (QID) | INTRAMUSCULAR | Status: DC | PRN

## 2017-08-26 MED ORDER — IPRATROPIUM-ALBUTEROL 0.5-2.5 (3) MG/3ML IN SOLN
3.0000 mL | RESPIRATORY_TRACT | Status: DC
  Administered 2017-08-26 – 2017-08-27 (×7): 3 mL via RESPIRATORY_TRACT
  Filled 2017-08-26 (×8): qty 3

## 2017-08-26 MED ORDER — METHYLPREDNISOLONE SODIUM SUCC 40 MG IJ SOLR
40.0000 mg | Freq: Two times a day (BID) | INTRAMUSCULAR | Status: DC
  Administered 2017-08-26 – 2017-08-27 (×2): 40 mg via INTRAVENOUS
  Filled 2017-08-26 (×2): qty 1

## 2017-08-26 NOTE — Progress Notes (Signed)
Pharmacy Antibiotic Note  Levi Brown is a 61 y.o. male admitted on 08/25/2017 with postobstructive pneumonia.  Pharmacy has been consulted for Vancomycin and Zosyn dosing.  Plan: Adjust vancomycin to 1g IV q12h Check trough at steady state, goal 15-20 mcg/ml Continue Zosyn 3.375gm IV q8h (4hr extended infusions) Follow up renal function & cultures De-escalate as appropriate  Height: 5\' 5"  (165.1 cm) Weight: 165 lb 2 oz (74.9 kg) IBW/kg (Calculated) : 61.5  Temp (24hrs), Avg:99.2 F (37.3 C), Min:98.1 F (36.7 C), Max:101.1 F (38.4 C)   Recent Labs Lab 08/25/17 1728 08/25/17 1744 08/26/17 0204 08/26/17 0735  WBC 10.6*  --   --   --   CREATININE 0.40*  --  0.36* 0.56*  LATICACIDVEN  --  0.97  --   --     Estimated Creatinine Clearance: 94.1 mL/min (A) (by C-G formula based on SCr of 0.56 mg/dL (L)).    No Known Allergies  Antimicrobials this admission:  9/12 Roceph/Zithro x 1 9/13 Vanc >> 9/13 Zosyn >>  Dose adjustments this admission:  9/13 Adjust vancomycin from 750mg  q8h to 1g q12h  Microbiology results:  9/12 BCx: sent 9/13 MRSA PCR: neg  Thank you for allowing pharmacy to be a part of this patient's care.  Peggyann Juba, PharmD, BCPS Pager: [phone] 08/26/2017 10:49 AM

## 2017-08-26 NOTE — Care Management Note (Signed)
Case Management Note  Patient Details  Name: Levi Brown MRN: [id number] Date of Birth: 1955/12/15  Subjective/Objective:                  Ventilator, resp failure, sepsis  Action/Plan: Date:  August 26, 2017 Chart reviewed for concurrent status and case management needs. Will continue to follow patient progress. Discharge Planning: following for needs Expected discharge date: [id number] Levi Brown, BSN, [place], [place]  Expected Discharge Date:                  Expected Discharge Plan:  Home/Self Care  In-House Referral:     Discharge planning Services  CM Consult  Post Acute Care Choice:    Choice offered to:     DME Arranged:    DME Agency:     HH Arranged:    HH Agency:     Status of Service:  In process, will continue to follow  If discussed at Long Length of Stay Meetings, dates discussed:    Additional Comments:  Levi Cha, RN 08/26/2017, 8:39 AM

## 2017-08-26 NOTE — Procedures (Signed)
Extubation Procedure Note  Patient Details:   Name: Levi Brown DOB: [birth year] MRN: [id number]   Airway Documentation:     Evaluation  O2 sats: 893 Complications: none Patient tolerated procedure well. Bilateral Breath Sounds: Clear, Diminished   Pt able to speak  Per CCM order, pt extubated and placed on nasal cannula.  Pt tolerated well, no complications.  Martha Clan 08/26/2017, 11:23 AM

## 2017-08-26 NOTE — Telephone Encounter (Signed)
Attempting to reach family to inform them the patient is being transported to the ED. No answer at sister's number. Phoned emergency contact, Melvin Martinique, next and explained situation. Mr. Martinique committed to locating the patient's sister and informing her. Informed patient. He verbalized understanding and appreciation.

## 2017-08-26 NOTE — Progress Notes (Signed)
  Radiation Oncology         [phone] ________________________________  Name: LEEAM CEDRONE MRN: [id number]  Date: 08/25/2017  DOB: 08-11-1956  Chart Note:  I reviewed this patient's most recent findings and wanted to take a minute to document my impression.   I spoke with Dr. Lake Bells.  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  He was admitted and intubated for respiratory failure.  He has had thoracentesis and he has been extubated.  We discussed goals of care.  Patient is now DNR, however, he is stable.  At this point, we have the choice of considering a switch to comfort care versus resume radiation in hopes of improving his pulmonary status.  Since the patient is currently stable, we agreed that it may be reasonable to attempt to resume radiotherapy to the chest tomorrow if clinically appropriate.  The patient's condition is guarded at present, so, we will monitor his progress and change plans along the way as needed.   ________________________________  Sheral Apley Tammi Klippel, M.D.

## 2017-08-26 NOTE — Progress Notes (Signed)
PULMONARY / CRITICAL CARE MEDICINE   Name: Levi Brown MRN: [id number] DOB: 1956/01/05    ADMISSION DATE:  08/25/2017 CONSULTATION DATE:  08/26/2017  REFERRING MD:  Gilford Raid EDP  CHIEF COMPLAINT:  dyspnea  BRIEF:   61 y/o male with stage IV squamous cell carcinoma was admitted on 9/12 with acute respiratory failure with hypoxemia due to a large left sided effusion and large left mass obstructing the left lower lobe.     SUBJECTIVE:  Intubated overnight Weaning well on vent this morning Has fever  VITAL SIGNS: BP 97/66   Pulse (!) 102   Temp 100.2 F (37.9 C) (Oral)   Resp 14   Ht 5\' 5"  (1.651 m)   Wt 165 lb 2 oz (74.9 kg)   SpO2 100%   BMI 27.48 kg/m   HEMODYNAMICS:    VENTILATOR SETTINGS: Vent Mode: PSV;CPAP FiO2 (%):  [40 %-100 %] 40 % Set Rate:  [14 bmp] 14 bmp Vt Set:  [500 mL-560 mL] 500 mL PEEP:  [5 cmH20] 5 cmH20 Pressure Support:  [10 cmH20] 10 cmH20 Plateau Pressure:  [13 cmH20-24 cmH20] 19 cmH20  INTAKE / OUTPUT: I/O last 3 completed shifts: In: 1027.4 [I.V.:577.4; IV Piggyback:450] Out: 1600 [Urine:1600]  PHYSICAL EXAMINATION:  General:  In bed on vent HENT: NCAT ETT in place PULM: Diminished left lung B, vent supported breathing CV: RRR, no mgr GI: BS+, soft, nontender MSK: normal bulk and tone Neuro: awake on vent, following commands Derm: trace edema arms, legs   LABS:  BMET  Recent Labs Lab 08/25/17 1728 08/26/17 0204 08/26/17 0735  NA 117* 125* 124*  K 3.9 4.3 3.7  CL 72* 83* 82*  CO2 36* 34* 34*  BUN 6 <5* <5*  CREATININE 0.40* 0.36* 0.56*  GLUCOSE 119* 112* 113*    Electrolytes  Recent Labs Lab 08/25/17 1728 08/26/17 0204 08/26/17 0735  CALCIUM 8.4* 8.1* 8.3*    CBC  Recent Labs Lab 08/25/17 1728  WBC 10.6*  HGB 8.2*  HCT 25.0*  PLT 668*    Coag's  Recent Labs Lab 08/26/17 0204  APTT 34  INR 1.17    Sepsis Markers  Recent Labs Lab 08/25/17 1744  LATICACIDVEN 0.97    ABG  Recent  Labs Lab 08/25/17 1656 08/25/17 2225  PHART 7.289* 7.420  PCO2ART 81.7* 53.7*  PO2ART 91.1 396*    Liver Enzymes  Recent Labs Lab 08/25/17 1728  AST 25  ALT 72*  ALKPHOS 161*  BILITOT 0.2*  ALBUMIN 2.3*    Cardiac Enzymes  Recent Labs Lab 08/25/17 1728  TROPONINI <0.03    Glucose No results for input(s): GLUCAP in the last 168 hours.  Imaging Ct Angio Chest Pe W Or Wo Contrast  Result Date: 08/25/2017 CLINICAL DATA:  Dyspnea, orthostasis, confusion and tachycardia. Hypoxia with pulse oximeter 67%. Patient canceled radiation therapy today due to progressive weakness. Has not started chemotherapy. EXAM: CT ANGIOGRAPHY CHEST WITH CONTRAST TECHNIQUE: Multidetector CT imaging of the chest was performed using the standard protocol during bolus administration of intravenous contrast. Multiplanar CT image reconstructions and MIPs were obtained to evaluate the vascular anatomy. CONTRAST:  100 cc Isovue 370 IV COMPARISON:  None. FINDINGS: Cardiovascular: Right-sided aortic arch with aberrant left subclavian artery. No acute pulmonary embolus. Marked narrowing of the left main pulmonary artery secondary to previously described mediastinal soft tissue mass. No aortic aneurysm or dissection. Heart size is normal without pericardial effusion. Mediastinum/Nodes: The previously described soft tissue mass within the superior mediastinal causing  mass effect on the left main pulmonary artery, obliterating the left upper lobe bronchus and impinging upon the left upper lobe bronchus is less distinct due to adjacent new left upper lobe atelectasis likely secondary to postobstructive involvement of the left upper lobe bronchus Lungs/Pleura: New moderate left effusion with new left lower lobe compressive atelectasis. Postobstructive collapse of the left upper lobe is more expensive since prior exam with only a scant amount of aerated lingula remaining. The contralateral right lung remains aerated without  pneumonic consolidation or effusion. No pneumothorax. Upper Abdomen: No acute abnormality. Musculoskeletal: No chest wall abnormality. No acute or significant osseous findings. Review of the MIP images confirms the above findings. IMPRESSION: 1. New moderate left-sided pleural effusion with left lower lobe compressive atelectasis. Only a scant amount of aerated lung remains within the left hemithorax. 2. Further progression of left upper lobe collapse likely postobstructive in etiology secondary to previously described mediastinal soft tissue mass. Where the margins of this mass ends and the beginning of atelectatic lung begins is difficult to determine due to similar soft tissue attenuation of these abutting abnormalities. This mass is again noted to obstruct the left upper lobe bronchus, markedly narrowing the left main pulmonary artery and partially narrow the left lower lobe bronchus. 3. Right-sided aortic arch with aberrant left subclavian artery. Electronically Signed   By: Ashley Royalty M.D.   On: 08/25/2017 21:37   Dg Chest Portable 1 View  Result Date: 08/25/2017 CLINICAL DATA:  Intubation and NG tube placement. EXAM: PORTABLE CHEST 1 VIEW COMPARISON:  Radiographs and chest CT earlier this day. FINDINGS: Endotracheal tube tip 5.2 cm from the carina. Enteric tube in place, and tip below the diaphragm not included in the field of view. Again seen volume loss in the left hemithorax with left pleural effusion. Right paratracheal density corresponds to right aortic arch. Right lung is clear. IMPRESSION: 1. Endotracheal tube tip 5.2 cm from the carina. Enteric tube in place, tip below the diaphragm not included in the field of view. 2. Unchanged left lung volume loss and left pleural effusion. Mediastinal mass evaluated on chest CT earlier this day. Electronically Signed   By: Jeb Levering M.D.   On: 08/25/2017 22:32   Dg Chest Port 1 View  Result Date: 08/25/2017 CLINICAL DATA:  Shortness of breath.  History of LEFT lung cancer, RIGHT-sided aortic arch with vascular loop. EXAM: PORTABLE CHEST 1 VIEW COMPARISON:  PET-CT July 29, 2017 FINDINGS: Elevated LEFT hemidiaphragm, LEFT lung volume loss with perihilar consolidation with air bronchograms. Small LEFT pleural effusion. RIGHT lung is clear. The cardiac silhouette is mildly enlarged, limited assessment due to LEFT process. No pneumothorax. Soft tissue planes and included osseous structures are nonsuspicious, known osseous metastasis not apparent by plain radiography. IMPRESSION: Worsening LEFT lung atelectasis and consolidation/pneumonia, with underlying known LEFT lung mass. Small LEFT pleural effusion. Electronically Signed   By: Elon Alas M.D.   On: 08/25/2017 19:08     STUDIES:  08/26/2017 CT angiogram chest> no pulmonary embolism, large left sided effusion and left sided mass both causing compressive atelectasis of the left lung, no PE  CULTURES: 9/12 blood >  9/13 resp culture >   ANTIBIOTICS: 9/13 vanc >  9/13 zosyn >   SIGNIFICANT EVENTS:   LINES/TUBES: 9/13 ETT >   DISCUSSION: 61 y/o male with stage IV squamous cell carcinoma and a rapidly progressive left sided pleural effusion presented on 9/13 with progressive worsening dyspnea which lead to acute respiratory failur with hypoxemia  requiring intubation.    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 possible P:   Send respiratory culture Continue vanc/zosyn   ENDOCRINE A:   No acute issues P:   Monitor glucose  NEUROLOGIC A:   No acute issues P:   RASS goal: 0 Continue PAD protocol with propofol   FAMILY  - Updates: updated sister and girlfriend bedside on 9/13 - Goals of care: needs palliative care or interdisciplinary meeting with family by 9/20  My cc time 40 minutes  Roselie Awkward, MD [place] [facility] Pager: [phone] Cell: [phone] After 3pm or if no response, call [phone]   08/26/2017, 9:41 AM

## 2017-08-26 NOTE — Progress Notes (Signed)
Pharmacy Antibiotic Note  Levi Brown is a 61 y.o. male admitted on 08/25/2017 with pneumonia.  Pharmacy has been consulted for Vancomycin and Zosyn dosing.  Plan: Vancomycin 750mg  IV every 8 hours.  Goal trough 15-20 mcg/mL. Zosyn 3.375g IV q8h (4 hour infusion).  Height: 5\' 5"  (165.1 cm) Weight: 165 lb 2 oz (74.9 kg) IBW/kg (Calculated) : 61.5  Temp (24hrs), Avg:99.2 F (37.3 C), Min:98.1 F (36.7 C), Max:101.1 F (38.4 C)   Recent Labs Lab 08/25/17 1728 08/25/17 1744 08/26/17 0204  WBC 10.6*  --   --   CREATININE 0.40*  --  0.36*  LATICACIDVEN  --  0.97  --     Estimated Creatinine Clearance: 94.1 mL/min (A) (by C-G formula based on SCr of 0.36 mg/dL (L)).    No Known Allergies  Antimicrobials this admission: Vancomycin 08/26/2017 >> Zosyn 08/26/2017 >>   Dose adjustments this admission: -  Microbiology results: pending  Thank you for allowing pharmacy to be a part of this patient's care.  Nani Skillern Crowford 08/26/2017 4:22 AM

## 2017-08-26 NOTE — Progress Notes (Signed)
Phoned ICU and spoke with Earma Reading, RN. Ruby, RN reports that the patient is scheduled for a thoracentesis today. Also, she reports the critical care doctor does not want the patient to receive his radiation therapy today. Informed Dr. Tammi Klippel and Trudee Kuster, RT of these findings. Radiation treatment for today was cancelled. Will check patient status again tomorrow.

## 2017-08-26 NOTE — ED Notes (Signed)
Suctioned for copious oral secretions tolerating IV diprovan well pt sleeping

## 2017-08-26 NOTE — Procedures (Signed)
Thoracentesis Procedure Note  Pre-operative Diagnosis: Acute respiratory failure with hypoxemia, left pleural effusion  Post-operative Diagnosis: normal  Indications: acute respiratory failure   Procedure Details  Consent: Informed consent was obtained. Risks of the procedure were discussed including: infection, bleeding, pain, pneumothorax.  Under sterile conditions the patient was positioned. Betadine solution and sterile drapes were utilized.  1% buffered lidocaine was used to anesthetize the 11th rib space. Fluid was obtained without any difficulties and minimal blood loss.  A dressing was applied to the wound and wound care instructions were provided.   Findings 550 ml of green-yellow and clear pleural fluid was obtained. A sample was sent to Pathology for cytogenetics, flow, and cell counts, as well as for infection analysis.  Complications:  None; patient tolerated the procedure well.          Condition: stable  Plan A follow up chest x-ray was ordered. Bed Rest for 1 hours. Tylenol 650 mg. for pain.  Attending Attestation: I performed the procedure.  Roselie Awkward, MD [place] [facility] Pager: [phone] Cell: [phone] After 3pm or if no response, call [phone]

## 2017-08-26 NOTE — H&P (Addendum)
PULMONARY / CRITICAL CARE MEDICINE   Name: Levi Brown MRN: [id number] DOB: [birth year]    ADMISSION DATE:  08/25/2017 CONSULTATION DATE:  08/26/2017  REFERRING MD:  ED   CHIEF COMPLAINT:  Shortness of breath  HISTORY OF PRESENT ILLNESS:   61 yr old male with diagnosis of stage IV NSCLC undergoing radiation therapy coming in with progressive shortness of breath for the last 2 weeks. He noticed some swelling of his lower limbs so he was started on lasix by his PCP but did not help. He called for help today and was found to have pulse ox in the 60s. Patient was in the ED taken to CT scan and after he came back he became unresponsive with worsening respiratory status so he was intubated.   As per sister patient did not have any fever or chills but did have sputum production and cough. No chest pain or lower extremity pain. Ct chest showed moderate left effusion with left upper lobe collapse from known mediastinal mass.    PAST MEDICAL HISTORY :  He  has a past medical history of Former smoker and Non-small cell cancer of left lung ([place]) (07/13/2017).  PAST SURGICAL HISTORY: He  has a past surgical history that includes Video bronchoscopy (Bilateral, 07/07/2017).  No Known Allergies  No current facility-administered medications on file prior to encounter.    Current Outpatient Prescriptions on File Prior to Encounter  Medication Sig  . albuterol (PROVENTIL) (2.5 MG/3ML) 0.083% nebulizer solution Take 3 mLs (2.5 mg total) by nebulization every 6 (six) hours as needed for wheezing or shortness of breath.  Marland Kitchen amLODipine (NORVASC) 5 MG tablet Take 1 tablet (5 mg total) by mouth daily.  Marland Kitchen aspirin EC 81 MG tablet Take 1 tablet (81 mg total) by mouth daily.  . furosemide (LASIX) 20 MG tablet Take 1 tablet (20 mg total) by mouth daily as needed. (Patient taking differently: Take 20 mg by mouth daily as needed for fluid or edema. )  . ipratropium (ATROVENT) 0.02 % nebulizer solution Take 2.5 mLs  (0.5 mg total) by nebulization 4 (four) times daily.  . metoprolol tartrate (LOPRESSOR) 25 MG tablet Take 1 tablet (25 mg total) by mouth 2 (two) times daily.  . Multiple Vitamins-Minerals (DRY EYE FORMULA PO) Place 1 drop into both eyes daily.  . ondansetron (ZOFRAN ODT) 4 MG disintegrating tablet Take 1 tablet (4 mg total) by mouth every 8 (eight) hours as needed for nausea or vomiting.  . potassium chloride (K-DUR) 10 MEQ tablet Take 1 tablet (10 mEq total) by mouth daily.  . sucralfate (CARAFATE) 1 g tablet Take 1 tablet (1 g total) by mouth 4 (four) times daily -  with meals and at bedtime. Dissolve tablet in 15 cc of water and drink 15 minutes before meals and at bedtime.  . traZODone (DESYREL) 50 MG tablet Take 0.5-1 tablets (25-50 mg total) by mouth at bedtime as needed for sleep.  Marland Kitchen acetaminophen (TYLENOL) 325 MG tablet Take 325 mg by mouth every 6 (six) hours as needed for moderate pain.  Marland Kitchen albuterol (PROVENTIL HFA;VENTOLIN HFA) 108 (90 Base) MCG/ACT inhaler Inhale 2 puffs into the lungs every 4 (four) hours as needed for wheezing or shortness of breath (cough, shortness of breath or wheezing.).  Marland Kitchen Dextromethorphan Polistirex (COUGH DM PO) Take 30 mLs by mouth daily as needed (cough).  . Fluticasone Furoate (ARNUITY ELLIPTA) 100 MCG/ACT AEPB Inhale 2 puffs into the lungs daily.  Marland Kitchen HYDROcodone-homatropine (HYCODAN) 5-1.5 MG/5ML syrup Take  5 mLs by mouth every 8 (eight) hours as needed for cough.  . potassium chloride SA (K-DUR,KLOR-CON) 20 MEQ tablet Take 1 tablet (20 mEq total) by mouth daily. (Patient not taking: Reported on 08/25/2017)  . Wound Cleansers (RADIAPLEX EX) Apply topically.    FAMILY HISTORY:  His indicated that the status of his mother is unknown.    SOCIAL HISTORY: He  reports that he quit smoking about 20 months ago. His smoking use included Cigarettes. He has a 30.00 pack-year smoking history. He has never used smokeless tobacco. He reports that he drinks about 8.4 -  12.6 oz of alcohol per week . He reports that he does not use drugs.  REVIEW OF SYSTEMS:   Could not be obtained due to sedation    VITAL SIGNS: BP 103/74   Pulse 88   Temp 98.4 F (36.9 C) (Oral)   Resp (!) 25   Ht 5\' 5"  (1.651 m)   Wt 71.7 kg (158 lb)   SpO2 100%   BMI 26.29 kg/m   HEMODYNAMICS:    VENTILATOR SETTINGS: Vent Mode: PRVC FiO2 (%):  [40 %-100 %] 50 % Set Rate:  [14 bmp] 14 bmp Vt Set:  [500 mL-560 mL] 500 mL PEEP:  [5 cmH20] 5 cmH20 Plateau Pressure:  [13 cmH20-24 cmH20] 13 cmH20  INTAKE / OUTPUT: No intake/output data recorded.  PHYSICAL EXAMINATION: General:  Intubated not in distress Neuro:  Sedated not following commands  HEENT:  Intubated  Cardiovascular:  Normal heart sounds no murmurs Lungs:  Decreased air sounds on the left side  Abdomen:  Soft no tenderness  Musculoskeletal:  + lower limb edema  Skin:  No rash   LABS:  BMET  Recent Labs Lab 08/25/17 1728  NA 117*  K 3.9  CL 72*  CO2 36*  BUN 6  CREATININE 0.40*  GLUCOSE 119*    Electrolytes  Recent Labs Lab 08/25/17 1728  CALCIUM 8.4*    CBC  Recent Labs Lab 08/25/17 1728  WBC 10.6*  HGB 8.2*  HCT 25.0*  PLT 668*    Coag's No results for input(s): APTT, INR in the last 168 hours.  Sepsis Markers  Recent Labs Lab 08/25/17 1744  LATICACIDVEN 0.97    ABG  Recent Labs Lab 08/25/17 1656 08/25/17 2225  PHART 7.289* 7.420  PCO2ART 81.7* 53.7*  PO2ART 91.1 396*    Liver Enzymes  Recent Labs Lab 08/25/17 1728  AST 25  ALT 72*  ALKPHOS 161*  BILITOT 0.2*  ALBUMIN 2.3*    Cardiac Enzymes  Recent Labs Lab 08/25/17 1728  TROPONINI <0.03    Glucose No results for input(s): GLUCAP in the last 168 hours.  Imaging Ct Angio Chest Pe W Or Wo Contrast  Result Date: 08/25/2017 CLINICAL DATA:  Dyspnea, orthostasis, confusion and tachycardia. Hypoxia with pulse oximeter 67%. Patient canceled radiation therapy today due to progressive  weakness. Has not started chemotherapy. EXAM: CT ANGIOGRAPHY CHEST WITH CONTRAST TECHNIQUE: Multidetector CT imaging of the chest was performed using the standard protocol during bolus administration of intravenous contrast. Multiplanar CT image reconstructions and MIPs were obtained to evaluate the vascular anatomy. CONTRAST:  100 cc Isovue 370 IV COMPARISON:  None. FINDINGS: Cardiovascular: Right-sided aortic arch with aberrant left subclavian artery. No acute pulmonary embolus. Marked narrowing of the left main pulmonary artery secondary to previously described mediastinal soft tissue mass. No aortic aneurysm or dissection. Heart size is normal without pericardial effusion. Mediastinum/Nodes: The previously described soft tissue mass within the  superior mediastinal causing mass effect on the left main pulmonary artery, obliterating the left upper lobe bronchus and impinging upon the left upper lobe bronchus is less distinct due to adjacent new left upper lobe atelectasis likely secondary to postobstructive involvement of the left upper lobe bronchus Lungs/Pleura: New moderate left effusion with new left lower lobe compressive atelectasis. Postobstructive collapse of the left upper lobe is more expensive since prior exam with only a scant amount of aerated lingula remaining. The contralateral right lung remains aerated without pneumonic consolidation or effusion. No pneumothorax. Upper Abdomen: No acute abnormality. Musculoskeletal: No chest wall abnormality. No acute or significant osseous findings. Review of the MIP images confirms the above findings. IMPRESSION: 1. New moderate left-sided pleural effusion with left lower lobe compressive atelectasis. Only a scant amount of aerated lung remains within the left hemithorax. 2. Further progression of left upper lobe collapse likely postobstructive in etiology secondary to previously described mediastinal soft tissue mass. Where the margins of this mass ends and the  beginning of atelectatic lung begins is difficult to determine due to similar soft tissue attenuation of these abutting abnormalities. This mass is again noted to obstruct the left upper lobe bronchus, markedly narrowing the left main pulmonary artery and partially narrow the left lower lobe bronchus. 3. Right-sided aortic arch with aberrant left subclavian artery. Electronically Signed   By: Ashley Royalty M.D.   On: 08/25/2017 21:37   Dg Chest Portable 1 View  Result Date: 08/25/2017 CLINICAL DATA:  Intubation and NG tube placement. EXAM: PORTABLE CHEST 1 VIEW COMPARISON:  Radiographs and chest CT earlier this day. FINDINGS: Endotracheal tube tip 5.2 cm from the carina. Enteric tube in place, and tip below the diaphragm not included in the field of view. Again seen volume loss in the left hemithorax with left pleural effusion. Right paratracheal density corresponds to right aortic arch. Right lung is clear. IMPRESSION: 1. Endotracheal tube tip 5.2 cm from the carina. Enteric tube in place, tip below the diaphragm not included in the field of view. 2. Unchanged left lung volume loss and left pleural effusion. Mediastinal mass evaluated on chest CT earlier this day. Electronically Signed   By: Jeb Levering M.D.   On: 08/25/2017 22:32   Dg Chest Port 1 View  Result Date: 08/25/2017 CLINICAL DATA:  Shortness of breath. History of LEFT lung cancer, RIGHT-sided aortic arch with vascular loop. EXAM: PORTABLE CHEST 1 VIEW COMPARISON:  PET-CT July 29, 2017 FINDINGS: Elevated LEFT hemidiaphragm, LEFT lung volume loss with perihilar consolidation with air bronchograms. Small LEFT pleural effusion. RIGHT lung is clear. The cardiac silhouette is mildly enlarged, limited assessment due to LEFT process. No pneumothorax. Soft tissue planes and included osseous structures are nonsuspicious, known osseous metastasis not apparent by plain radiography. IMPRESSION: Worsening LEFT lung atelectasis and  consolidation/pneumonia, with underlying known LEFT lung mass. Small LEFT pleural effusion. Electronically Signed   By: Elon Alas M.D.   On: 08/25/2017 19:08     STUDIES:  9/12 Ct chest >> 1. New moderate left-sided pleural effusion with left lower lobe compressive atelectasis. Only a scant amount of aerated lung remains within the left hemithorax. 2. Further progression of left upper lobe collapse likely postobstructive in etiology secondary to previously described mediastinal soft tissue mass. Where the margins of this mass ends and the beginning of atelectatic lung begins is difficult to determine due to similar soft tissue attenuation of these abutting abnormalities. This mass is again noted to obstruct the left upper lobe  bronchus, markedly narrowing the left main pulmonary artery and partially narrow the left lower lobe bronchus. 3. Right-sided aortic arch with aberrant left subclavian artery.   CULTURES: Sputum culture >>  ANTIBIOTICS: Zosyn vanc  SIGNIFICANT EVENTS: Intubated 08/25/2017   ASSESSMENT / PLAN:  Acute hypoxemic hypercapnic respiratory failure requiring mechanical ventilation Moderate left pleural effusion ? Malignant Severe hyponatremia most likely hypovolemic Stage IV NSCLC undergoing radiation Severe COPD exacerbation Left upper lobe collapse Suspected post obstructive pneumonia   Plan: - nebs q4hr - sedate with propofol - left pleural effusion therapeutic/diagnostic thoracentesis in the morning - sputum culture - zosyn vanc since I can not rule out post obstructive pneumonia - IV solumedrol - palliative care consult in the morning would be helpful.  - oncology input would be helpful also - DVT and GI prophylaxis. Hold heparin Sq before thoracentesis  - wean down FIO2 - will start IVF NS and hold lasix for his hyponatremia and follow BMP q6hrs - urine sodium, serum and urine osm.    I have spent 40 mins of CC time bedside or in the  unit exclusive of any billable procedures patient is needing ICU due to respiratory failure requiring mechanical ventilation   FAMILY  - Updates: sister and girlfriend bedside updated and discussed goals of care and code status. They will address it with other family members. Full code for now.   - Inter-disciplinary family meet or Palliative Care meeting due by: 9/20    [facility] and [place] Pager: [phone]  08/26/2017, 12:27 AM

## 2017-08-26 NOTE — Progress Notes (Signed)
Per CCM MD okay to leave pt on 8cc/kg.  RT to monitor and assess as needed.

## 2017-08-26 NOTE — Progress Notes (Signed)
[facility]  I discussed goals of care with the patient and family in the setting of his stage 4 lung cancer.  I explained that if he is still short of breath after extubation then there is little we will be able to do to prevent further respiratory decline.  He says that he does not want to go back on life support and he does not want to be rescuscitated.  Roselie Awkward, MD [place] [facility] Pager: [phone] Cell: [phone] After 3pm or if no response, call [phone]

## 2017-08-26 NOTE — ED Notes (Signed)
Patient transported to ICU via stretcher condition stable remains on ventilator tolerating well. Continues to have copious oral secretions and requires frequent oral suctioning.

## 2017-08-26 NOTE — Progress Notes (Signed)
Initial Nutrition Assessment  DOCUMENTATION CODES:   Not applicable  INTERVENTION:  - Diet advancement as medically feasible. - RD will monitor for nutrition-related needs at follow-up.   NUTRITION DIAGNOSIS:   Increased nutrient needs related to catabolic illness, cancer and cancer related treatments as evidenced by estimated needs.  GOAL:   Patient will meet greater than or equal to 90% of their needs  MONITOR:   Diet advancement, Weight trends, Labs  REASON FOR ASSESSMENT:   Malnutrition Screening Tool  ASSESSMENT:   61 yr old male with diagnosis of stage IV NSCLC undergoing radiation therapy coming in with progressive shortness of breath for the last 2 weeks. He noticed some swelling of his lower limbs so he was started on lasix by his PCP but did not help. He called for help today and was found to have pulse ox in the 60s. Patient was in the ED taken to CT scan and after he came back he became unresponsive with worsening respiratory status so he was intubated.  Pt seen for MST. BMI indicates overweight status. Pt was intubated in the ED and was subsequently extubated today at ~11:20AM. Pt denies throat pain s/p extubation. He states that abdomen is currently bloated but he is not experiencing abdominal pain or nausea and is wondering what would be causing distention. Pt underwent bedside thoracentesis this AM prior to extubation with 550cc green-yellow and clear pleural fluid aspirated. Reviewed Dr. Anastasia Pall note from 11:03AM outlining pt not want to be on life support after this AM's extubation.  Pt has had 18/33 radiation treatments and is not on chemo. He denies any difficulty or pain with swallowing. He confirms poor appetite but states that this is not associated with starting/receiving chemo and that he has had a poor appetite "all my life." He reports that it is due to working outside in the heat and changing temperatures.   Physical assessment shows no muscle and no  fat wasting, mild edema to BLE. Per chart review, weight has fluctuated (157-165 lbs) since 07/13/17.  Medications reviewed; 40 mg Solu-medrol BID, 40 mg IV Protonix/day.  Labs reviewed; Na: 124 mmol/L, Cl: 82 mmol/L, BUN: <5 mg/dL, creatinine: 0.56 mg/dL, Ca: 8.3 mg/dL.  IVF: NS @ 100 mL/hr.    Diet Order:  Diet NPO time specified  Skin:  Reviewed, no issues  Last BM:  PTA/unknown  Height:   Ht Readings from Last 1 Encounters:  08/26/17 5\' 5"  (1.651 m)    Weight:   Wt Readings from Last 1 Encounters:  08/26/17 165 lb 2 oz (74.9 kg)    Ideal Body Weight:  61.82 kg  BMI:  Body mass index is 27.48 kg/m.  Estimated Nutritional Needs:   Kcal:  2250-2475 (30-33 kcal/kg)  Protein:  112-127 grams (1.5-1.7 grams/kg)  Fluid:  >/= 2.2 L/day  EDUCATION NEEDS:   No education needs identified at this time    Jarome Matin, MS, RD, LDN, CNSC Inpatient Clinical Dietitian Pager # [phone] After hours/weekend pager # [phone]

## 2017-08-27 ENCOUNTER — Ambulatory Visit: Payer: Medicaid Other

## 2017-08-27 ENCOUNTER — Inpatient Hospital Stay (HOSPITAL_COMMUNITY): Payer: Medicaid Other

## 2017-08-27 DIAGNOSIS — J441 Chronic obstructive pulmonary disease with (acute) exacerbation: Secondary | ICD-10-CM

## 2017-08-27 DIAGNOSIS — C3492 Malignant neoplasm of unspecified part of left bronchus or lung: Principal | ICD-10-CM

## 2017-08-27 DIAGNOSIS — J9811 Atelectasis: Secondary | ICD-10-CM

## 2017-08-27 LAB — BASIC METABOLIC PANEL
ANION GAP: 7 (ref 5–15)
ANION GAP: 8 (ref 5–15)
ANION GAP: 7 (ref 5–15)
ANION GAP: 8 (ref 5–15)
ANION GAP: 9 (ref 5–15)
BUN: <5 mg/dL — ABNORMAL LOW (ref 6–20)
BUN: <5 mg/dL — ABNORMAL LOW (ref 6–20)
BUN: <5 mg/dL — ABNORMAL LOW (ref 6–20)
BUN: <5 mg/dL — ABNORMAL LOW (ref 6–20)
BUN: <5 mg/dL — ABNORMAL LOW (ref 6–20)
CALCIUM: 8.4 mg/dL — AB (ref 8.9–10.3)
CALCIUM: 8.5 mg/dL — AB (ref 8.9–10.3)
CALCIUM: 8.6 mg/dL — AB (ref 8.9–10.3)
CALCIUM: 8.5 mg/dL — AB (ref 8.9–10.3)
CALCIUM: 8.4 mg/dL — AB (ref 8.9–10.3)
CO2: 35 mmol/L — AB (ref 22–32)
CO2: 36 mmol/L — ABNORMAL HIGH (ref 22–32)
CO2: 37 mmol/L — ABNORMAL HIGH (ref 22–32)
CO2: 36 mmol/L — ABNORMAL HIGH (ref 22–32)
CO2: 36 mmol/L — AB (ref 22–32)
CREATININE: 0.45 mg/dL — AB (ref 0.61–1.24)
CREATININE: 0.48 mg/dL — AB (ref 0.61–1.24)
CREATININE: 0.53 mg/dL — AB (ref 0.61–1.24)
CREATININE: 0.51 mg/dL — AB (ref 0.61–1.24)
CREATININE: 0.51 mg/dL — AB (ref 0.61–1.24)
Chloride: 89 mmol/L — ABNORMAL LOW (ref 101–111)
Chloride: 90 mmol/L — ABNORMAL LOW (ref 101–111)
Chloride: 89 mmol/L — ABNORMAL LOW (ref 101–111)
Chloride: 88 mmol/L — ABNORMAL LOW (ref 101–111)
Chloride: 87 mmol/L — ABNORMAL LOW (ref 101–111)
Glucose, Bld: 128 mg/dL — ABNORMAL HIGH (ref 65–99)
Glucose, Bld: 129 mg/dL — ABNORMAL HIGH (ref 65–99)
Glucose, Bld: 117 mg/dL — ABNORMAL HIGH (ref 65–99)
Glucose, Bld: 98 mg/dL (ref 65–99)
Glucose, Bld: 100 mg/dL — ABNORMAL HIGH (ref 65–99)
Potassium: 3.6 mmol/L (ref 3.5–5.1)
Potassium: 3.6 mmol/L (ref 3.5–5.1)
Potassium: 3.5 mmol/L (ref 3.5–5.1)
Potassium: 3.6 mmol/L (ref 3.5–5.1)
Potassium: 3.4 mmol/L — ABNORMAL LOW (ref 3.5–5.1)
SODIUM: 133 mmol/L — AB (ref 135–145)
SODIUM: 132 mmol/L — AB (ref 135–145)
SODIUM: 132 mmol/L — AB (ref 135–145)
Sodium: 132 mmol/L — ABNORMAL LOW (ref 135–145)
Sodium: 133 mmol/L — ABNORMAL LOW (ref 135–145)

## 2017-08-27 LAB — CBC WITH DIFFERENTIAL/PLATELET
BASOS ABS: 0 10*3/uL (ref 0.0–0.1)
BASOS PCT: 0 %
EOS ABS: 0 10*3/uL (ref 0.0–0.7)
Eosinophils Relative: 0 %
HCT: 23 % — ABNORMAL LOW (ref 39.0–52.0)
Hemoglobin: 7.5 g/dL — ABNORMAL LOW (ref 13.0–17.0)
Lymphocytes Relative: 4 %
Lymphs Abs: 0.5 10*3/uL — ABNORMAL LOW (ref 0.7–4.0)
MCH: 29 pg (ref 26.0–34.0)
MCHC: 32.6 g/dL (ref 30.0–36.0)
MCV: 88.8 fL (ref 78.0–100.0)
MONO ABS: 0.5 10*3/uL (ref 0.1–1.0)
MONOS PCT: 4 %
NEUTROS ABS: 11.3 10*3/uL — AB (ref 1.7–7.7)
NEUTROS PCT: 92 %
Platelets: 653 10*3/uL — ABNORMAL HIGH (ref 150–400)
RBC: 2.59 MIL/uL — ABNORMAL LOW (ref 4.22–5.81)
RDW: 16.2 % — AB (ref 11.5–15.5)
WBC: 12.3 10*3/uL — ABNORMAL HIGH (ref 4.0–10.5)

## 2017-08-27 MED ORDER — PHENOL 1.4 % MT LIQD
1.0000 | OROMUCOSAL | Status: DC | PRN
  Filled 2017-08-27: qty 177

## 2017-08-27 MED ORDER — IPRATROPIUM-ALBUTEROL 0.5-2.5 (3) MG/3ML IN SOLN
3.0000 mL | RESPIRATORY_TRACT | Status: DC | PRN
  Administered 2017-08-29 – 2017-08-30 (×2): 3 mL via RESPIRATORY_TRACT
  Filled 2017-08-27 (×3): qty 3

## 2017-08-27 MED ORDER — FLUTICASONE FUROATE-VILANTEROL 100-25 MCG/INH IN AEPB
1.0000 | INHALATION_SPRAY | Freq: Every day | RESPIRATORY_TRACT | Status: DC
  Administered 2017-08-27 – 2017-09-04 (×5): 1 via RESPIRATORY_TRACT
  Filled 2017-08-27: qty 28

## 2017-08-27 MED ORDER — POTASSIUM CHLORIDE 10 MEQ/100ML IV SOLN
10.0000 meq | INTRAVENOUS | Status: AC
  Administered 2017-08-27 – 2017-08-28 (×2): 10 meq via INTRAVENOUS

## 2017-08-27 MED ORDER — GI COCKTAIL ~~LOC~~
15.0000 mL | Freq: Two times a day (BID) | ORAL | Status: DC | PRN
  Administered 2017-08-27 – 2017-08-30 (×2): 15 mL via ORAL
  Filled 2017-08-27 (×2): qty 30

## 2017-08-27 NOTE — Progress Notes (Signed)
PULMONARY / CRITICAL CARE MEDICINE   Name: Levi Brown MRN: [id number] DOB: Aug 29, 1956    ADMISSION DATE:  08/25/2017 CONSULTATION DATE:  08/26/2017  REFERRING MD:  Gilford Raid EDP  CHIEF COMPLAINT:  dyspnea  BRIEF:   61 y/o male with stage IV squamous cell carcinoma was admitted on 9/12 with acute respiratory failure with hypoxemia due to a large left sided effusion and large left mass obstructing the left lower lobe.     SUBJECTIVE:  Extubated Feels well Let us know that his code status is DNR  VITAL SIGNS: BP 100/63   Pulse (!) 113   Temp 98.2 F (36.8 C) (Oral)   Resp (!) 22   Ht 5\' 5"  (1.651 m)   Wt 164 lb 0.4 oz (74.4 kg)   SpO2 99%   BMI 27.29 kg/m   HEMODYNAMICS:    VENTILATOR SETTINGS:    INTAKE / OUTPUT: I/O last 3 completed shifts: In: 3206.9 [P.O.:240; I.V.:1966.9; IV Piggyback:1000] Out: 5350 [Urine:5350]  PHYSICAL EXAMINATION:  General:  Resting comfortably in bed HENT: NCAT OP clear PULM: CTA B, normal effort CV: RRR, no mgr GI: BS+, soft, nontender MSK: normal bulk and tone Neuro: awake, alert, no distress, MAEW    LABS:  BMET  Recent Labs Lab 08/26/17 1910 08/27/17 0148 08/27/17 0717  NA 133* 133*  132* 133*  K 3.6 3.6  3.6 3.5  CL 89* 90*  89* 89*  CO2 35* 36*  35* 37*  BUN <5* <5*  <5* <5*  CREATININE 0.63 0.48*  0.45* 0.53*  GLUCOSE 149* 128*  129* 117*    Electrolytes  Recent Labs Lab 08/26/17 1910 08/27/17 0148 08/27/17 0717  CALCIUM 8.8* 8.5*  8.4* 8.6*    CBC  Recent Labs Lab 08/25/17 1728 08/27/17 0148  WBC 10.6* 12.3*  HGB 8.2* 7.5*  HCT 25.0* 23.0*  PLT 668* 653*    Coag's  Recent Labs Lab 08/26/17 0204  APTT 34  INR 1.17    Sepsis Markers  Recent Labs Lab 08/25/17 1744  LATICACIDVEN 0.97    ABG  Recent Labs Lab 08/25/17 1656 08/25/17 2225  PHART 7.289* 7.420  PCO2ART 81.7* 53.7*  PO2ART 91.1 396*    Liver Enzymes  Recent Labs Lab 08/25/17 1728  08/26/17 1346  AST 25  --   ALT 72*  --   ALKPHOS 161*  --   BILITOT 0.2*  --   ALBUMIN 2.3* 2.2*    Cardiac Enzymes  Recent Labs Lab 08/25/17 1728  TROPONINI <0.03    Glucose No results for input(s): GLUCAP in the last 168 hours.  Imaging Dg Chest Port 1 View  Result Date: 08/27/2017 CLINICAL DATA:  Acute respiratory failure with hypoxia. History of left lung cancer. EXAM: PORTABLE CHEST 1 VIEW COMPARISON:  08/26/2017 FINDINGS: Exam demonstrates stable postsurgical change with volume loss of the left lung with elevation of the left hemidiaphragm. There is worsening opacification over the mid to lower left lung likely worsening effusion with atelectasis. Cannot exclude infection in the mid to lower left lung. Right lung is clear. Cardiomediastinal silhouette and remainder of the exam is unchanged. IMPRESSION: Interval worsening of small left pleural effusion likely with associated atelectasis. Cannot exclude infection in the left base. Stable postsurgical change/ volume loss of the left lung. Electronically Signed   By: Marin Olp M.D.   On: 08/27/2017 07:08   Dg Chest Port 1 View  Result Date: 08/26/2017 CLINICAL DATA:  Status post left side thoracentesis. 500cc removed. Hx  of left lung mass. EXAM: PORTABLE CHEST 1 VIEW COMPARISON:  08/25/2017 FINDINGS: There is persistent elevation of left hemidiaphragm. No pneumothorax. Mediastinal mass again identified. The right lung is clear. No pulmonary edema. IMPRESSION: No pneumothorax following thoracentesis. Electronically Signed   By: Nolon Nations M.D.   On: 08/26/2017 11:31     STUDIES:  08/26/2017 CT angiogram chest> no pulmonary embolism, large left sided effusion and left sided mass both causing compressive atelectasis of the left lung, no PE  CULTURES: 9/12 blood >  9/13 resp culture > moderate GPC and GPR 9/14 pleural > gram stain negative  ANTIBIOTICS: 9/13 vanc >  9/13 zosyn >   SIGNIFICANT  EVENTS:   LINES/TUBES: 9/13 ETT > 9/14   DISCUSSION: 61 y/o male with stage IV squamous cell carcinoma and a rapidly progressive left sided pleural effusion presented on 9/13 with progressive worsening dyspnea which lead to acute respiratory failur with hypoxemia requiring intubation.    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 possible P:   Follow up respiratory culture Continue vanc/zosyn   ENDOCRINE A:   No acute issues P:   Monitor glucose  NEUROLOGIC A:   No acute issues P:   Stop sedation protocol   FAMILY  - Updates: updated sister and girlfriend bedside on 9/13 - Goals of care: needs palliative care or interdisciplinary meeting with family by 9/20  Code status: DNR  Roselie Awkward, MD [place] [facility] Pager: [phone] Cell: [phone] After 3pm or if no response, call [phone]   08/27/2017, 9:30 AM

## 2017-08-28 LAB — CBC
HEMATOCRIT: 24.3 % — AB (ref 39.0–52.0)
Hemoglobin: 7.7 g/dL — ABNORMAL LOW (ref 13.0–17.0)
MCH: 28.9 pg (ref 26.0–34.0)
MCHC: 31.7 g/dL (ref 30.0–36.0)
MCV: 91.4 fL (ref 78.0–100.0)
PLATELETS: 640 10*3/uL — AB (ref 150–400)
RBC: 2.66 MIL/uL — ABNORMAL LOW (ref 4.22–5.81)
RDW: 16.4 % — AB (ref 11.5–15.5)
WBC: 12.4 10*3/uL — AB (ref 4.0–10.5)

## 2017-08-28 LAB — COMPREHENSIVE METABOLIC PANEL
ALBUMIN: 2 g/dL — AB (ref 3.5–5.0)
ALK PHOS: 113 U/L (ref 38–126)
ALT: 54 U/L (ref 17–63)
AST: 25 U/L (ref 15–41)
Anion gap: 7 (ref 5–15)
BILIRUBIN TOTAL: 0.5 mg/dL (ref 0.3–1.2)
CALCIUM: 8.4 mg/dL — AB (ref 8.9–10.3)
CO2: 37 mmol/L — ABNORMAL HIGH (ref 22–32)
Chloride: 88 mmol/L — ABNORMAL LOW (ref 101–111)
Creatinine, Ser: 0.54 mg/dL — ABNORMAL LOW (ref 0.61–1.24)
GFR calc Af Amer: >60 mL/min (ref >60)
GLUCOSE: 109 mg/dL — AB (ref 65–99)
Potassium: 3.5 mmol/L (ref 3.5–5.1)
Sodium: 132 mmol/L — ABNORMAL LOW (ref 135–145)
TOTAL PROTEIN: 5.3 g/dL — AB (ref 6.5–8.1)

## 2017-08-28 LAB — CREATININE, SERUM
Creatinine, Ser: 0.53 mg/dL — ABNORMAL LOW (ref 0.61–1.24)
GFR calc Af Amer: >60 mL/min (ref >60)
GFR calc non Af Amer: >60 mL/min (ref >60)

## 2017-08-28 LAB — PH, BODY FLUID: pH, Body Fluid: 7.9

## 2017-08-28 LAB — CULTURE, RESPIRATORY: CULTURE: NORMAL

## 2017-08-28 LAB — VANCOMYCIN, TROUGH: Vancomycin Tr: 6 ug/mL — ABNORMAL LOW (ref 15–20)

## 2017-08-28 LAB — CULTURE, RESPIRATORY W GRAM STAIN

## 2017-08-28 MED ORDER — PANTOPRAZOLE SODIUM 40 MG PO TBEC
40.0000 mg | DELAYED_RELEASE_TABLET | Freq: Every day | ORAL | Status: DC
  Administered 2017-08-28 – 2017-09-03 (×7): 40 mg via ORAL
  Filled 2017-08-28 (×7): qty 1

## 2017-08-28 MED ORDER — METOPROLOL TARTRATE 25 MG PO TABS
25.0000 mg | ORAL_TABLET | Freq: Two times a day (BID) | ORAL | Status: DC
  Administered 2017-08-28 – 2017-09-01 (×8): 25 mg via ORAL
  Filled 2017-08-28 (×9): qty 1

## 2017-08-28 NOTE — Progress Notes (Signed)
Pharmacy IV to PO conversion  The patient is receiving Protonix by the intravenous route.  Based on criteria approved by the Pharmacy and [facility], the medication is being converted to the equivalent oral dose form.   No active GI bleeding or impaired absorption  Not s/p esophagectomy  Documented ability to tolerate oral diet of full liquids or better for > 24 hr  Plan to continue treatment for at least 1 day  If you have any questions about this conversion, please contact the Pharmacy Department (ext [phone]).  Thank you.  Reuel Boom, PharmD Pager: [phone] 08/28/2017, 11:00 AM

## 2017-08-28 NOTE — Progress Notes (Signed)
Pharmacy Antibiotic Note  Levi Brown is a 61 y.o. male admitted on 08/25/2017 with postobstructive pneumonia.  Pharmacy has been consulted for Vancomycin and Zosyn dosing.  Plan:  Based on negative MRSA PCR, MD stopping vancomycin. Does have GPCs in respiratory Cx (arrangement not specified), slight increase in O2 requirement and effusion on CXR, so low threshold to resume if clinically deteriorates.  Continue Zosyn 3.375gm IV q8h (4hr extended infusions)  Follow up renal function & cultures  De-escalate as appropriate  Height: 5\' 5"  (165.1 cm) Weight: 168 lb 14.4 oz (76.6 kg) IBW/kg (Calculated) : 61.5  Temp (24hrs), Avg:98.8 F (37.1 C), Min:98.2 F (36.8 C), Max:99.7 F (37.6 C)   Recent Labs Lab 08/25/17 1728 08/25/17 1744  08/27/17 0148 08/27/17 0717 08/27/17 1328 08/27/17 1909 08/28/17 0621 08/28/17 0915  WBC 10.6*  --   --  12.3*  --   --   --   --   --   CREATININE 0.40*  --   < > 0.48*  0.45* 0.53* 0.51* 0.51* 0.53*  --   LATICACIDVEN  --  0.97  --   --   --   --   --   --   --   VANCOTROUGH  --   --   --   --   --   --   --   --  6*  < > = values in this interval not displayed.  Estimated Creatinine Clearance: 94.9 mL/min (A) (by C-G formula based on SCr of 0.53 mg/dL (L)).    No Known Allergies  Antimicrobials this admission:  9/12 Roceph/Zithro x 1 9/13 Vanc >> 9/15 9/13 Zosyn >>  Dose adjustments this admission:  9/13 Adjust vancomycin from 750mg  q8h to 1g q12h 9/15 VT = 6 on 1000 mg q12 hr  Microbiology results:  9/12 BCx: NGTD 9/13 MRSA PCR: neg 9/13 Trach asp: moderate GPCs and GNR; reincubated 9/13 Pleural fluid: NGTD   Thank you for allowing pharmacy to be a part of this patient's care.  Reuel Boom, PharmD, BCPS Pager: [phone] 08/28/2017, 10:46 AM

## 2017-08-28 NOTE — Progress Notes (Signed)
Patient ID: Levi Brown, male   DOB: Oct 11, 1956, 61 y.o.   MRN: [id number]                                                                PROGRESS NOTE                                                                                                                                                                                                             Patient Demographics:    Levi Brown, is a 61 y.o. male, DOB - [birth year], FXT:[id number]  Admit date - 08/25/2017   Admitting Physician Juanito Doom, MD  Outpatient Primary MD for the patient is [name], [place]  LOS - 2  Outpatient Specialists:    Tyler Pita (rad onc) Grace Isaac (oncology)  Chief Complaint  Patient presents with  . Respiratory Distress       Brief Narrative   61 yr old male with diagnosis of stage IV NSCLC undergoing radiation therapy coming in with progressive shortness of breath for the last 2 weeks. He noticed some swelling of his lower limbs so he was started on lasix by his PCP but did not help. He called for help today and was found to have pulse ox in the 60s. Patient was in the ED taken to CT scan and after he came back he became unresponsive with worsening respiratory status so he was intubated.   As per sister patient did not have any fever or chills but did have sputum production and cough. No chest pain or lower extremity pain. Ct chest showed moderate left effusion with left upper lobe collapse from known mediastinal mass.     Subjective:    Levi Brown today states that breathing is ok.   Minimal cough.  Slight white sputum.  Denies fever, chills, hemoptysis, cp, palp, n/v, diarrhea.  No bm yesterday, but pt doesn't want anything at this time.    No headache, No abdominal pain - No Nausea, No new weakness tingling or numbness   Assessment  & Plan :    Active Problems:   Non-small cell cancer of left lung (HCC)   Acute respiratory failure with hypoxemia (HCC)   Pleural  effusion on left   Collapse of left lung   Hyponatremia  COPD exacerbation ([place])   Obstructive pneumonia    61 y/o male with stage IV squamous cell carcinoma and a rapidly progressive left sided pleural effusion presented on 9/13 with progressive worsening dyspnea which lead to acute respiratory failur with hypoxemia requiring intubation.    ASSESSMENT / PLAN:   Large left pleural effusion > exudative, => atypical cells seen, difficult to tell if due to malignancy  Large left lung mass> squamous cell carcinoma Left lung atelectasis Acute respiratory failure with hypoxemia > improving COPD  Continue duoneb prn Cont Breo OK for radiation therapy today from my perspective Will need pleural drainage catheter (Pleur-X) if cytology is positive per PCCM D/w McQuaid , if patient is stable clinically which he appears to be no pleur-X Taper down to Zosyn   Sinus tachycardia , CTA chest 9/12=> negative for PE Right sided aortic arch Tele  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 possible Follow up respiratory culture Continue zosyn DC vanco    FAMILY  - Updates: updated sister and girlfriend bedside on 9/13 - Goals of care: needs palliative care or interdisciplinary meeting with family by 9/20   Code Status : DNR  Family Communication  : w patient  Disposition Plan  :   home  Barriers For Discharge :   Consults  :  PCCM  Procedures  :   STUDIES:  08/26/2017 CT angiogram chest> no pulmonary embolism, large left sided effusion and left sided mass both causing compressive atelectasis of the left lung, no PE  CULTURES: 9/12 blood >  9/13 resp culture > moderate GPC and GPR 9/14 pleural > gram stain negative  ANTIBIOTICS: 9/13 vanc >  9/13 zosyn >   SIGNIFICANT  EVENTS:   LINES/TUBES: 9/13 ETT > 9/14    DVT Prophylaxis  :  Heparin,. SCDs   Lab Results  Component Value Date   PLT 653 (H) 08/27/2017    Antibiotics  :  Vanco/ zosyn  Anti-infectives    Start     Dose/Rate Route Frequency Ordered Stop   08/26/17 2200  vancomycin (VANCOCIN) IVPB 1000 mg/200 mL premix     1,000 mg 200 mL/hr over 60 Minutes Intravenous Every 12 hours 08/26/17 1047     08/26/17 0800  vancomycin (VANCOCIN) IVPB 750 mg/150 ml premix  Status:  Discontinued     750 mg 150 mL/hr over 60 Minutes Intravenous Every 8 hours 08/26/17 0419 08/26/17 1047   08/26/17 0600  piperacillin-tazobactam (ZOSYN) IVPB 3.375 g     3.375 g 12.5 mL/hr over 240 Minutes Intravenous Every 8 hours 08/26/17 0419     08/26/17 0045  vancomycin (VANCOCIN) IVPB 1000 mg/200 mL premix     1,000 mg 200 mL/hr over 60 Minutes Intravenous  Once 08/26/17 0030 08/26/17 0156   08/26/17 0045  piperacillin-tazobactam (ZOSYN) IVPB 3.375 g     3.375 g 100 mL/hr over 30 Minutes Intravenous  Once 08/26/17 0030 08/26/17 0238   08/25/17 1930  cefTRIAXone (ROCEPHIN) 1 g in dextrose 5 % 50 mL IVPB     1 g 100 mL/hr over 30 Minutes Intravenous  Once 08/25/17 1928 08/26/17 0043   08/25/17 1930  azithromycin (ZITHROMAX) 500 mg in dextrose 5 % 250 mL IVPB     500 mg 250 mL/hr over 60 Minutes Intravenous  Once 08/25/17 1928 08/25/17 2333  Objective:   Vitals:   08/27/17 2200 08/27/17 2300 08/27/17 2355 08/28/17 0456  BP: (!) 129/94 (!) 146/102  109/67  Pulse: (!) 108 (!) 121  (!) 115  Resp: (!) 24 (!) 21  20  Temp:  99.1 F (37.3 C) 99.7 F (37.6 C) 98.6 F (37 C)  TempSrc:  Oral Oral Oral  SpO2: 100% 92%  100%  Weight:    76.6 kg (168 lb 14.4 oz)  Height:        Wt Readings from Last 3 Encounters:  08/28/17 76.6 kg (168 lb 14.4 oz)  08/23/17 72.5 kg (159 lb 12.8 oz)  08/17/17 71.5 kg (157 lb 9.6 oz)     Intake/Output Summary (Last 24 hours) at 08/28/17 0944 Last data filed at  08/28/17 0857  Gross per 24 hour  Intake             2070 ml  Output              500 ml  Net             1570 ml     Physical Exam  Awake Alert, Oriented X 3, No new F.N deficits, Normal affect [place].AT,PERRAL Supple Neck,No JVD, No cervical lymphadenopathy appriciated.  Symmetrical Chest wall movement, Good air movement bilaterally, decrease in bs left lung base, no crackle, no wheezing RRR,No Gallops,Rubs or new Murmurs, No Parasternal Heave +ve B.Sounds, Abd Soft, No tenderness, No organomegaly appriciated, No rebound - guarding or rigidity. No Cyanosis, Clubbing or edema, No new Rash or bruise     Data Review:    CBC  Recent Labs Lab 08/25/17 1728 08/27/17 0148  WBC 10.6* 12.3*  HGB 8.2* 7.5*  HCT 25.0* 23.0*  PLT 668* 653*  MCV 86.5 88.8  MCH 28.4 29.0  MCHC 32.8 32.6  RDW 15.8* 16.2*  LYMPHSABS 0.6* 0.5*  MONOABS 0.9 0.5  EOSABS 0.1 0.0  BASOSABS 0.0 0.0    Chemistries   Recent Labs Lab 08/25/17 1728  08/26/17 1910 08/27/17 0148 08/27/17 0717 08/27/17 1328 08/27/17 1909 08/28/17 0621  NA 117*  < > 133* 133*  132* 133* 132* 132*  --   K 3.9  < > 3.6 3.6  3.6 3.5 3.6 3.4*  --   CL 72*  < > 89* 90*  89* 89* 88* 87*  --   CO2 36*  < > 35* 36*  35* 37* 36* 36*  --   GLUCOSE 119*  < > 149* 128*  129* 117* 98 100*  --   BUN 6  < > <5* <5*  <5* <5* <5* <5*  --   CREATININE 0.40*  < > 0.63 0.48*  0.45* 0.53* 0.51* 0.51* 0.53*  CALCIUM 8.4*  < > 8.8* 8.5*  8.4* 8.6* 8.5* 8.4*  --   AST 25  --   --   --   --   --   --   --   ALT 72*  --   --   --   --   --   --   --   ALKPHOS 161*  --   --   --   --   --   --   --   BILITOT 0.2*  --   --   --   --   --   --   --   < > = values in this interval not displayed. ------------------------------------------------------------------------------------------------------------------  Recent Labs  08/26/17 0204  TRIG 63  Lab Results  Component Value Date   HGBA1C 4.9 07/08/2017    ------------------------------------------------------------------------------------------------------------------ No results for input(s): TSH, T4TOTAL, T3FREE, THYROIDAB in the last 72 hours.  Invalid input(s): FREET3 ------------------------------------------------------------------------------------------------------------------ No results for input(s): VITAMINB12, FOLATE, FERRITIN, TIBC, IRON, RETICCTPCT in the last 72 hours.  Coagulation profile  Recent Labs Lab 08/26/17 0204  INR 1.17    No results for input(s): DDIMER in the last 72 hours.  Cardiac Enzymes  Recent Labs Lab 08/25/17 1728  TROPONINI <0.03   ------------------------------------------------------------------------------------------------------------------    Component Value Date/Time   BNP 219.2 (H) 08/25/2017 1728    Inpatient Medications  Scheduled Meds: . chlorhexidine  15 mL Mouth Rinse BID  . fluticasone furoate-vilanterol  1 puff Inhalation Daily  . heparin  5,000 Units Subcutaneous Q8H  . mouth rinse  15 mL Mouth Rinse q12n4p  . pantoprazole (PROTONIX) IV  40 mg Intravenous QHS   Continuous Infusions: . sodium chloride    . sodium chloride 100 mL/hr at 08/28/17 0015  . piperacillin-tazobactam (ZOSYN)  IV 3.375 g (08/28/17 0609)  . vancomycin Stopped (08/27/17 2231)   PRN Meds:.sodium chloride, fentaNYL (SUBLIMAZE) injection, gi cocktail, ipratropium-albuterol, ondansetron (ZOFRAN) IV, phenol  Micro Results Recent Results (from the past 240 hour(s))  Blood culture (routine x 2)     Status: None (Preliminary result)   Collection Time: 08/25/17  5:25 PM  Result Value Ref Range Status   Specimen Description BLOOD LEFT HAND  Final   Special Requests   Final    IN PEDIATRIC BOTTLE Blood Culture results may not be optimal due to an excessive volume of blood received in culture bottles   Culture   Final    NO GROWTH 2 DAYS Performed at [facility], [place] [address]  [place], [place] [address]    Report Status PENDING  Incomplete  Blood culture (routine x 2)     Status: None (Preliminary result)   Collection Time: 08/25/17  5:27 PM  Result Value Ref Range Status   Specimen Description BLOOD LEFT ANTECUBITAL  Final   Special Requests   Final    BOTTLES DRAWN AEROBIC AND ANAEROBIC Blood Culture adequate volume   Culture   Final    NO GROWTH 2 DAYS Performed at [facility], [place] [address], [place], [place] [address]    Report Status PENDING  Incomplete  MRSA PCR Screening     Status: None   Collection Time: 08/26/17  1:31 AM  Result Value Ref Range Status   MRSA by PCR NEGATIVE NEGATIVE Final    Comment:        The GeneXpert MRSA Assay (FDA approved for NASAL specimens only), is one component of a comprehensive MRSA colonization surveillance program. It is not intended to diagnose MRSA infection nor to guide or monitor treatment for MRSA infections.   Culture, respiratory (NON-Expectorated)     Status: None (Preliminary result)   Collection Time: 08/26/17  9:48 AM  Result Value Ref Range Status   Specimen Description TRACHEAL ASPIRATE  Final   Special Requests NONE  Final   Gram Stain   Final    ABUNDANT WBC PRESENT, PREDOMINANTLY PMN MODERATE GRAM POSITIVE RODS MODERATE GRAM POSITIVE COCCI    Culture   Final    CULTURE REINCUBATED FOR BETTER GROWTH Performed at [facility], [place] [address], [place], [place] [address]    Report Status PENDING  Incomplete  Body fluid culture (includes gram stain)     Status: None (  Preliminary result)   Collection Time: 08/26/17 10:45 AM  Result Value Ref Range Status   Specimen Description PLEURAL  Final   Special Requests NONE  Final   Gram Stain   Final    FEW WBC PRESENT,BOTH PMN AND MONONUCLEAR NO ORGANISMS SEEN    Culture   Final    NO GROWTH 2 DAYS Performed at [facility], [address] [place], [place] [address]    Report Status PENDING  Incomplete    Radiology  Reports Ct Angio Chest Pe W Or Wo Contrast  Result Date: 08/25/2017 CLINICAL DATA:  Dyspnea, orthostasis, confusion and tachycardia. Hypoxia with pulse oximeter 67%. Patient canceled radiation therapy today due to progressive weakness. Has not started chemotherapy. EXAM: CT ANGIOGRAPHY CHEST WITH CONTRAST TECHNIQUE: Multidetector CT imaging of the chest was performed using the standard protocol during bolus administration of intravenous contrast. Multiplanar CT image reconstructions and MIPs were obtained to evaluate the vascular anatomy. CONTRAST:  100 cc Isovue 370 IV COMPARISON:  None. FINDINGS: Cardiovascular: Right-sided aortic arch with aberrant left subclavian artery. No acute pulmonary embolus. Marked narrowing of the left main pulmonary artery secondary to previously described mediastinal soft tissue mass. No aortic aneurysm or dissection. Heart size is normal without pericardial effusion. Mediastinum/Nodes: The previously described soft tissue mass within the superior mediastinal causing mass effect on the left main pulmonary artery, obliterating the left upper lobe bronchus and impinging upon the left upper lobe bronchus is less distinct due to adjacent new left upper lobe atelectasis likely secondary to postobstructive involvement of the left upper lobe bronchus Lungs/Pleura: New moderate left effusion with new left lower lobe compressive atelectasis. Postobstructive collapse of the left upper lobe is more expensive since prior exam with only a scant amount of aerated lingula remaining. The contralateral right lung remains aerated without pneumonic consolidation or effusion. No pneumothorax. Upper Abdomen: No acute abnormality. Musculoskeletal: No chest wall abnormality. No acute or significant osseous findings. Review of the MIP images confirms the above findings. IMPRESSION: 1. New moderate left-sided pleural effusion with left lower lobe compressive atelectasis. Only a scant amount of aerated lung  remains within the left hemithorax. 2. Further progression of left upper lobe collapse likely postobstructive in etiology secondary to previously described mediastinal soft tissue mass. Where the margins of this mass ends and the beginning of atelectatic lung begins is difficult to determine due to similar soft tissue attenuation of these abutting abnormalities. This mass is again noted to obstruct the left upper lobe bronchus, markedly narrowing the left main pulmonary artery and partially narrow the left lower lobe bronchus. 3. Right-sided aortic arch with aberrant left subclavian artery. Electronically Signed   By: Ashley Royalty M.D.   On: 08/25/2017 21:37   Nm Pet Image Initial (pi) Skull Base To Thigh  Result Date: 07/29/2017 CLINICAL DATA:  Initial treatment strategy for lung cancer. EXAM: NUCLEAR MEDICINE PET SKULL BASE TO THIGH TECHNIQUE: 14.98 mCi F-18 FDG was injected intravenously. Full-ring PET imaging was performed from the skull base to thigh after the radiotracer. CT data was obtained and used for attenuation correction and anatomic localization. FASTING BLOOD GLUCOSE:  Value: 82 mg/dl COMPARISON:  None FINDINGS: NECK: No hypermetabolic lymph nodes in the neck. CHEST: 9.3 cm left sided mediastinal mass is again identified. SUV max is equal to 26. There is complete occlusion of the left upper lobe bronchus and narrowing of the left lower lobe bronchus. Postobstructive atelectasis and consolidation of the left upper lobe is noted. There  is a moderate size left pleural effusion. Hypermetabolic right paratracheal lymph node measures 7.39. There is a left-sided cardio phrenic lymph node which has an SUV max equal to 3.8. ABDOMEN/PELVIS: No abnormal hypermetabolic activity within the liver, pancreas, adrenal glands, or spleen. No hypermetabolic lymph nodes in the abdomen or pelvis. SKELETON: There is diffuse increased radiotracer uptake throughout the axial and appendicular skeleton. Findings are likely  related to either treatment related changes or anemia. No corresponding lytic or sclerotic bone lesions identified on the corresponding CT images. A more focal area of intense radiotracer uptake involves the sternum. SUV max is equal to 29.0 in there is a 11 mm lytic lesion. IMPRESSION: 1. Left-sided inphase an obstructing mediastinal mass is again noted and exhibits intense FDG uptake. Assuming non-small cell histology imaging findings are compatible with [id number] or stage IV disease. 2. Mediastinal mass completely occludes the left upper lobe bronchus with postobstructive atelectasis of the left upper lobe. 3. Contralateral, right paratracheal hypermetabolic lymph node. 4. Hypermetabolic lytic metastasis involves the sternum. 5. Left pleural effusion. Electronically Signed   By: Kerby Moors M.D.   On: 07/29/2017 12:01   Dg Chest Port 1 View  Result Date: 08/27/2017 CLINICAL DATA:  Acute respiratory failure with hypoxia. History of left lung cancer. EXAM: PORTABLE CHEST 1 VIEW COMPARISON:  08/26/2017 FINDINGS: Exam demonstrates stable postsurgical change with volume loss of the left lung with elevation of the left hemidiaphragm. There is worsening opacification over the mid to lower left lung likely worsening effusion with atelectasis. Cannot exclude infection in the mid to lower left lung. Right lung is clear. Cardiomediastinal silhouette and remainder of the exam is unchanged. IMPRESSION: Interval worsening of small left pleural effusion likely with associated atelectasis. Cannot exclude infection in the left base. Stable postsurgical change/ volume loss of the left lung. Electronically Signed   By: Marin Olp M.D.   On: 08/27/2017 07:08   Dg Chest Port 1 View  Result Date: 08/26/2017 CLINICAL DATA:  Status post left side thoracentesis. 500cc removed. Hx of left lung mass. EXAM: PORTABLE CHEST 1 VIEW COMPARISON:  08/25/2017 FINDINGS: There is persistent elevation of left hemidiaphragm. No  pneumothorax. Mediastinal mass again identified. The right lung is clear. No pulmonary edema. IMPRESSION: No pneumothorax following thoracentesis. Electronically Signed   By: Nolon Nations M.D.   On: 08/26/2017 11:31   Dg Chest Portable 1 View  Result Date: 08/25/2017 CLINICAL DATA:  Intubation and NG tube placement. EXAM: PORTABLE CHEST 1 VIEW COMPARISON:  Radiographs and chest CT earlier this day. FINDINGS: Endotracheal tube tip 5.2 cm from the carina. Enteric tube in place, and tip below the diaphragm not included in the field of view. Again seen volume loss in the left hemithorax with left pleural effusion. Right paratracheal density corresponds to right aortic arch. Right lung is clear. IMPRESSION: 1. Endotracheal tube tip 5.2 cm from the carina. Enteric tube in place, tip below the diaphragm not included in the field of view. 2. Unchanged left lung volume loss and left pleural effusion. Mediastinal mass evaluated on chest CT earlier this day. Electronically Signed   By: Jeb Levering M.D.   On: 08/25/2017 22:32   Dg Chest Port 1 View  Result Date: 08/25/2017 CLINICAL DATA:  Shortness of breath. History of LEFT lung cancer, RIGHT-sided aortic arch with vascular loop. EXAM: PORTABLE CHEST 1 VIEW COMPARISON:  PET-CT July 29, 2017 FINDINGS: Elevated LEFT hemidiaphragm, LEFT lung volume loss with perihilar consolidation with air bronchograms. Small LEFT pleural effusion.  RIGHT lung is clear. The cardiac silhouette is mildly enlarged, limited assessment due to LEFT process. No pneumothorax. Soft tissue planes and included osseous structures are nonsuspicious, known osseous metastasis not apparent by plain radiography. IMPRESSION: Worsening LEFT lung atelectasis and consolidation/pneumonia, with underlying known LEFT lung mass. Small LEFT pleural effusion. Electronically Signed   By: Elon Alas M.D.   On: 08/25/2017 19:08    Time Spent in minutes  30   Jani Gravel M.D on 08/28/2017 at 9:44  AM  Between 7am to 7pm - Pager - [phone]  After 7pm go to [email or web address] - password [facility]  [facility] -  Office  [phone]

## 2017-08-29 ENCOUNTER — Inpatient Hospital Stay (HOSPITAL_COMMUNITY): Payer: Medicaid Other

## 2017-08-29 ENCOUNTER — Encounter (HOSPITAL_COMMUNITY): Payer: Self-pay | Admitting: Internal Medicine

## 2017-08-29 DIAGNOSIS — E876 Hypokalemia: Secondary | ICD-10-CM

## 2017-08-29 DIAGNOSIS — J9601 Acute respiratory failure with hypoxia: Secondary | ICD-10-CM

## 2017-08-29 LAB — BODY FLUID CULTURE: Culture: NO GROWTH

## 2017-08-29 LAB — CBC
HEMATOCRIT: 26.9 % — AB (ref 39.0–52.0)
Hemoglobin: 8.5 g/dL — ABNORMAL LOW (ref 13.0–17.0)
MCH: 29.2 pg (ref 26.0–34.0)
MCHC: 31.6 g/dL (ref 30.0–36.0)
MCV: 92.4 fL (ref 78.0–100.0)
Platelets: 585 10*3/uL — ABNORMAL HIGH (ref 150–400)
RBC: 2.91 MIL/uL — AB (ref 4.22–5.81)
RDW: 16.5 % — ABNORMAL HIGH (ref 11.5–15.5)
WBC: 9.7 10*3/uL (ref 4.0–10.5)

## 2017-08-29 MED ORDER — POTASSIUM CHLORIDE CRYS ER 20 MEQ PO TBCR
40.0000 meq | EXTENDED_RELEASE_TABLET | Freq: Once | ORAL | Status: AC
  Administered 2017-08-29: 40 meq via ORAL
  Filled 2017-08-29: qty 2

## 2017-08-29 MED ORDER — POTASSIUM CHLORIDE 10 MEQ/100ML IV SOLN
10.0000 meq | INTRAVENOUS | Status: AC
  Administered 2017-08-29 (×2): 10 meq via INTRAVENOUS
  Filled 2017-08-29 (×2): qty 100

## 2017-08-29 NOTE — Progress Notes (Addendum)
Patient ID: [name], male   DOB: [birth year], 61 y.o.   MRN: [id number]                                                                PROGRESS NOTE                                                                                                                                                                                                             Patient Demographics:    Levi Brown, is a 61 y.o. male, DOB - 1956-02-18, TIW:[id number]  Admit date - 08/25/2017   Admitting Physician Levi Doom, MD  Outpatient Primary MD for the patient is Levi Brown, [place]  LOS - 3  Outpatient Specialists: Levi Brown (rad onc) Levi Brown (oncology)  Chief Complaint  Patient presents with  . Respiratory Distress       Brief Narrative    61 yr old male with diagnosis of stage IV NSCLC undergoing radiation therapy coming in with progressive shortness of breath for the last 2 weeks. He noticed some swelling of his lower limbs so he was started on lasix by his PCP but did not help. He called for help today and was found to have pulse ox in the 60s. Patient was in the ED taken to CT scan and after he came back he became unresponsive with worsening respiratory status so he was intubated.   As per sister patient did not have any fever or chills but did have sputum production and cough. No chest pain or lower extremity pain. Ct chest showed moderate left effusion with left upper lobe collapse from known mediastinal mass. (squamous cell carcinoma)   Subjective:    Levi Brown today states breathing is slightly better.  Afebrile.  Pt c/o dizziness over the past day.  Pt remains tachycardic,    No chest pain, No abdominal pain - No Nausea, No new weakness tingling or numbness, No Cough -   Assessment  & Plan :    Active Problems:   Non-small cell cancer of left lung (HCC)   Acute respiratory failure with hypoxemia (HCC)   Pleural effusion on left   Collapse of left lung  Hyponatremia   COPD exacerbation (HCC)   Obstructive pneumonia   Large left pleural effusion >  exudative, => atypical cells seen, difficult to tell if due to malignancy  Large left lung mass> squamous cell carcinoma Left lung atelectasis Acute respiratory failure with hypoxemia > improving COPD  Continue duoneb prn Cont Breo OK for radiation therapy today from my perspective Will need pleural drainage catheter (Pleur-X) if cytology is positive per PCCM D/w Levi Brown , if patient is stable clinically which he appears to be no pleur-X Cont Zosyn  Dizziness Check MRI brain w and w/o contrast r/o metastatic disease  Sinus tachycardia , CTA chest 9/12=> negative for PE Check tsh Check cardiac echo  Right sided aortic arch Tele  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 possible Follow uprespiratory culture DC vanco 9/15 Continue zosyn     FAMILY - Updates: updated sister and girlfriend bedside on 9/13 - Goals of care: needs palliative care or interdisciplinary meeting with family by 9/20   Code Status : DNR  Family Communication  : w patient  Disposition Plan  :   home  Barriers For Discharge :   Consults  :  PCCM  Procedures  :   STUDIES: 08/26/2017 CT angiogram chest> no pulmonary embolism, large left sided effusion and left sided mass both causing compressive atelectasis of the left lung, no PE  CULTURES: 9/12 blood > 9/13 resp culture >moderate GPC and GPR 9/14 pleural > gram stain negative  ANTIBIOTICS: 9/13 vanc >9/15 9/13 zosyn >  SIGNIFICANT EVENTS:   LINES/TUBES: 9/13 ETT >9/14    DVT Prophylaxis  :  Heparin,. SCDs       Lab Results  Component Value Date   PLT 585 (H) 08/29/2017      Anti-infectives     Start     Dose/Rate Route Frequency Ordered Stop   08/26/17 2200  vancomycin (VANCOCIN) IVPB 1000 mg/200 mL premix  Status:  Discontinued     1,000 mg 200 mL/hr over 60 Minutes Intravenous Every 12 hours 08/26/17 1047 08/28/17 1029   08/26/17 0800  vancomycin (VANCOCIN) IVPB 750 mg/150 ml premix  Status:  Discontinued     750 mg 150 mL/hr over 60 Minutes Intravenous Every 8 hours 08/26/17 0419 08/26/17 1047   08/26/17 0600  piperacillin-tazobactam (ZOSYN) IVPB 3.375 g     3.375 g 12.5 mL/hr over 240 Minutes Intravenous Every 8 hours 08/26/17 0419     08/26/17 0045  vancomycin (VANCOCIN) IVPB 1000 mg/200 mL premix     1,000 mg 200 mL/hr over 60 Minutes Intravenous  Once 08/26/17 0030 08/26/17 0156   08/26/17 0045  piperacillin-tazobactam (ZOSYN) IVPB 3.375 g     3.375 g 100 mL/hr over 30 Minutes Intravenous  Once 08/26/17 0030 08/26/17 0238   08/25/17 1930  cefTRIAXone (ROCEPHIN) 1 g in dextrose 5 % 50 mL IVPB     1 g 100 mL/hr over 30 Minutes Intravenous  Once 08/25/17 1928 08/26/17 0043   08/25/17 1930  azithromycin (ZITHROMAX) 500 mg in dextrose 5 % 250 mL IVPB     500 mg 250 mL/hr over 60 Minutes Intravenous  Once 08/25/17 1928 08/25/17 2333        Objective:   Vitals:   08/28/17 1333 08/28/17 1338 08/28/17 2103 08/29/17 0635  BP: (!) 158/83  108/65 118/80  Pulse: (!) 102  (!) 101 (!) 108  Resp: 20  18 18  Temp: (!) 97.5 F (36.4 C)  98.7 F (37.1 C) 98.1 F (36.7 C)  TempSrc: Oral  Oral Oral  SpO2: 100% (!) 72% 98% 100%  Weight:    77.9 kg (171 lb 12.8 oz)  Height:        Wt Readings from Last 3 Encounters:  08/29/17 77.9 kg (171 lb 12.8 oz)  08/23/17 72.5 kg (159 lb 12.8 oz)  08/17/17 71.5 kg (157 lb 9.6 oz)     Intake/Output Summary (Last 24 hours) at 08/29/17 0951 Last data filed at 08/29/17 0622  Gross per 24 hour  Intake          3368.34 ml  Output             1275 ml  Net          2093.34 ml     Physical Exam  Awake Alert, Oriented X 3, No new F.N  deficits, Normal affect [facility].AT,PERRAL Supple Neck,No JVD, No cervical lymphadenopathy appriciated.  Symmetrical Chest wall movement, Good air movement bilaterally, decrease in bs left lung base about 1/2 up, no crackles no wheezing Tachy s1, s2, ,No Gallops,Rubs or new Murmurs, No Parasternal Heave +ve B.Sounds, Abd Soft, No tenderness, No organomegaly appriciated, No rebound - guarding or rigidity. No Cyanosis, Clubbing or edema, No new Rash or bruise      Data Review:    CBC  Recent Labs Lab 08/25/17 1728 08/27/17 0148 08/28/17 1053 08/29/17 0658  WBC 10.6* 12.3* 12.4* 9.7  HGB 8.2* 7.5* 7.7* 8.5*  HCT 25.0* 23.0* 24.3* 26.9*  PLT 668* 653* 640* 585*  MCV 86.5 88.8 91.4 92.4  MCH 28.4 29.0 28.9 29.2  MCHC 32.8 32.6 31.7 31.6  RDW 15.8* 16.2* 16.4* 16.5*  LYMPHSABS 0.6* 0.5*  --   --   MONOABS 0.9 0.5  --   --   EOSABS 0.1 0.0  --   --   BASOSABS 0.0 0.0  --   --     Chemistries   Recent Labs Lab 08/25/17 1728  08/27/17 0717 08/27/17 1328 08/27/17 1909 08/28/17 0621 08/28/17 1053 08/29/17 0658  NA 117*  < > 133* 132* 132*  --  132* 134*  K 3.9  < > 3.5 3.6 3.4*  --  3.5 2.9*  CL 72*  < > 89* 88* 87*  --  88* 88*  CO2 36*  < > 37* 36* 36*  --  37* 39*  GLUCOSE 119*  < > 117* 98 100*  --  109* 114*  BUN 6  < > <5* <5* <5*  --  <5* <5*  CREATININE 0.40*  < > 0.53* 0.51* 0.51* 0.53* 0.54* 0.47*  CALCIUM 8.4*  < > 8.6* 8.5* 8.4*  --  8.4* 8.3*  AST 25  --   --   --   --   --  25 19  ALT 72*  --   --   --   --   --  54 49  ALKPHOS 161*  --   --   --   --   --  113 105  BILITOT 0.2*  --   --   --   --   --  0.5 0.3  < > = values in this interval not displayed. ------------------------------------------------------------------------------------------------------------------ No results for input(s): CHOL, HDL, LDLCALC, TRIG, CHOLHDL, LDLDIRECT in the last 72 hours.  Lab Results  Component Value Date   HGBA1C 4.9 07/08/2017    ------------------------------------------------------------------------------------------------------------------ No results for input(s): TSH,  T4TOTAL, T3FREE, THYROIDAB in the last 72 hours.  Invalid input(s): FREET3 ------------------------------------------------------------------------------------------------------------------ No results for input(s): VITAMINB12, FOLATE, FERRITIN, TIBC, IRON, RETICCTPCT in the last 72 hours.  Coagulation profile  Recent Labs Lab 08/26/17 0204  INR 1.17    No results for input(s): DDIMER in the last 72 hours.  Cardiac Enzymes  Recent Labs Lab 08/25/17 1728  TROPONINI <0.03   ------------------------------------------------------------------------------------------------------------------    Component Value Date/Time   BNP 219.2 (H) 08/25/2017 1728    Inpatient Medications  Scheduled Meds: . chlorhexidine  15 mL Mouth Rinse BID  . fluticasone furoate-vilanterol  1 puff Inhalation Daily  . heparin  5,000 Units Subcutaneous Q8H  . mouth rinse  15 mL Mouth Rinse q12n4p  . metoprolol tartrate  25 mg Oral BID  . pantoprazole  40 mg Oral QHS  . potassium chloride  40 mEq Oral Once   Continuous Infusions: . sodium chloride    . sodium chloride 100 mL/hr at 08/28/17 2243  . piperacillin-tazobactam (ZOSYN)  IV 3.375 g (08/29/17 0622)  . potassium chloride     PRN Meds:.sodium chloride, fentaNYL (SUBLIMAZE) injection, gi cocktail, ipratropium-albuterol, ondansetron (ZOFRAN) IV, phenol  Micro Results Recent Results (from the past 240 hour(s))  Blood culture (routine x 2)     Status: None (Preliminary result)   Collection Time: 08/25/17  5:25 PM  Result Value Ref Range Status   Specimen Description BLOOD LEFT HAND  Final   Special Requests   Final    IN PEDIATRIC BOTTLE Blood Culture results may not be optimal due to an excessive volume of blood received in culture bottles   Culture   Final    NO GROWTH 4 DAYS Performed at [facility], [place] [address], [place], [place] [address]    Report Status PENDING  Incomplete  Blood culture (routine x 2)     Status: None (Preliminary result)   Collection Time: 08/25/17  5:27 PM  Result Value Ref Range Status   Specimen Description BLOOD LEFT ANTECUBITAL  Final   Special Requests   Final    BOTTLES DRAWN AEROBIC AND ANAEROBIC Blood Culture adequate volume   Culture   Final    NO GROWTH 4 DAYS Performed at [facility], [place] [address] [place], [place] [address]    Report Status PENDING  Incomplete  MRSA PCR Screening     Status: None   Collection Time: 08/26/17  1:31 AM  Result Value Ref Range Status   MRSA by PCR NEGATIVE NEGATIVE Final    Comment:        The GeneXpert MRSA Assay (FDA approved for NASAL specimens only), is one component of a comprehensive MRSA colonization surveillance program. It is not intended to diagnose MRSA infection nor to guide or monitor treatment for MRSA infections.   Culture, respiratory (NON-Expectorated)     Status: None   Collection Time: 08/26/17  9:48 AM  Result Value Ref Range Status   Specimen Description TRACHEAL ASPIRATE  Final   Special Requests NONE  Final   Gram Stain   Final    ABUNDANT WBC PRESENT, PREDOMINANTLY PMN MODERATE GRAM POSITIVE RODS MODERATE GRAM POSITIVE COCCI    Culture   Final    Consistent with normal respiratory flora. Performed at [facility], [place] [address] [place], [place] [address]    Report Status 08/28/2017 FINAL  Final  Body fluid culture (includes gram stain)     Status: None (Preliminary result)   Collection  Time: 08/26/17 10:45 AM  Result Value Ref Range Status   Specimen Description PLEURAL  Final   Special Requests NONE  Final   Gram Stain   Final    FEW WBC PRESENT,BOTH PMN AND MONONUCLEAR NO ORGANISMS SEEN    Culture   Final    NO GROWTH 2 DAYS Performed at [facility], [address], [place], [place] [address]    Report Status PENDING   Incomplete    Radiology Reports Ct Angio Chest Pe W Or Wo Contrast  Result Date: 08/25/2017 CLINICAL DATA:  Dyspnea, orthostasis, confusion and tachycardia. Hypoxia with pulse oximeter 67%. Patient canceled radiation therapy today due to progressive weakness. Has not started chemotherapy. EXAM: CT ANGIOGRAPHY CHEST WITH CONTRAST TECHNIQUE: Multidetector CT imaging of the chest was performed using the standard protocol during bolus administration of intravenous contrast. Multiplanar CT image reconstructions and MIPs were obtained to evaluate the vascular anatomy. CONTRAST:  100 cc Isovue 370 IV COMPARISON:  None. FINDINGS: Cardiovascular: Right-sided aortic arch with aberrant left subclavian artery. No acute pulmonary embolus. Marked narrowing of the left main pulmonary artery secondary to previously described mediastinal soft tissue mass. No aortic aneurysm or dissection. Heart size is normal without pericardial effusion. Mediastinum/Nodes: The previously described soft tissue mass within the superior mediastinal causing mass effect on the left main pulmonary artery, obliterating the left upper lobe bronchus and impinging upon the left upper lobe bronchus is less distinct due to adjacent new left upper lobe atelectasis likely secondary to postobstructive involvement of the left upper lobe bronchus Lungs/Pleura: New moderate left effusion with new left lower lobe compressive atelectasis. Postobstructive collapse of the left upper lobe is more expensive since prior exam with only a scant amount of aerated lingula remaining. The contralateral right lung remains aerated without pneumonic consolidation or effusion. No pneumothorax. Upper Abdomen: No acute abnormality. Musculoskeletal: No chest wall abnormality. No acute or significant osseous findings. Review of the MIP images confirms the above findings. IMPRESSION: 1. New moderate left-sided pleural effusion with left lower lobe compressive atelectasis. Only a  scant amount of aerated lung remains within the left hemithorax. 2. Further progression of left upper lobe collapse likely postobstructive in etiology secondary to previously described mediastinal soft tissue mass. Where the margins of this mass ends and the beginning of atelectatic lung begins is difficult to determine due to similar soft tissue attenuation of these abutting abnormalities. This mass is again noted to obstruct the left upper lobe bronchus, markedly narrowing the left main pulmonary artery and partially narrow the left lower lobe bronchus. 3. Right-sided aortic arch with aberrant left subclavian artery. Electronically Signed   By: Ashley Royalty M.D.   On: 08/25/2017 21:37   Dg Chest Port 1 View  Result Date: 08/27/2017 CLINICAL DATA:  Acute respiratory failure with hypoxia. History of left lung cancer. EXAM: PORTABLE CHEST 1 VIEW COMPARISON:  08/26/2017 FINDINGS: Exam demonstrates stable postsurgical change with volume loss of the left lung with elevation of the left hemidiaphragm. There is worsening opacification over the mid to lower left lung likely worsening effusion with atelectasis. Cannot exclude infection in the mid to lower left lung. Right lung is clear. Cardiomediastinal silhouette and remainder of the exam is unchanged. IMPRESSION: Interval worsening of small left pleural effusion likely with associated atelectasis. Cannot exclude infection in the left base. Stable postsurgical change/ volume loss of the left lung. Electronically Signed   By: Marin Olp M.D.   On: 08/27/2017 07:08   Dg Chest  Port 1 View  Result Date: 08/26/2017 CLINICAL DATA:  Status post left side thoracentesis. 500cc removed. Hx of left lung mass. EXAM: PORTABLE CHEST 1 VIEW COMPARISON:  08/25/2017 FINDINGS: There is persistent elevation of left hemidiaphragm. No pneumothorax. Mediastinal mass again identified. The right lung is clear. No pulmonary edema. IMPRESSION: No pneumothorax following thoracentesis.  Electronically Signed   By: Nolon Nations M.D.   On: 08/26/2017 11:31   Dg Chest Portable 1 View  Result Date: 08/25/2017 CLINICAL DATA:  Intubation and NG tube placement. EXAM: PORTABLE CHEST 1 VIEW COMPARISON:  Radiographs and chest CT earlier this day. FINDINGS: Endotracheal tube tip 5.2 cm from the carina. Enteric tube in place, and tip below the diaphragm not included in the field of view. Again seen volume loss in the left hemithorax with left pleural effusion. Right paratracheal density corresponds to right aortic arch. Right lung is clear. IMPRESSION: 1. Endotracheal tube tip 5.2 cm from the carina. Enteric tube in place, tip below the diaphragm not included in the field of view. 2. Unchanged left lung volume loss and left pleural effusion. Mediastinal mass evaluated on chest CT earlier this day. Electronically Signed   By: Jeb Levering M.D.   On: 08/25/2017 22:32   Dg Chest Port 1 View  Result Date: 08/25/2017 CLINICAL DATA:  Shortness of breath. History of LEFT lung cancer, RIGHT-sided aortic arch with vascular loop. EXAM: PORTABLE CHEST 1 VIEW COMPARISON:  PET-CT July 29, 2017 FINDINGS: Elevated LEFT hemidiaphragm, LEFT lung volume loss with perihilar consolidation with air bronchograms. Small LEFT pleural effusion. RIGHT lung is clear. The cardiac silhouette is mildly enlarged, limited assessment due to LEFT process. No pneumothorax. Soft tissue planes and included osseous structures are nonsuspicious, known osseous metastasis not apparent by plain radiography. IMPRESSION: Worsening LEFT lung atelectasis and consolidation/pneumonia, with underlying known LEFT lung mass. Small LEFT pleural effusion. Electronically Signed   By: Elon Alas M.D.   On: 08/25/2017 19:08    Time Spent in minutes  30   Jani Gravel M.D on 08/29/2017 at 9:51 AM  Between 7am to 7pm - Pager - [phone]  After 7pm go to [email or web address] - password [facility]  [facility] -  Office  [phone]

## 2017-08-29 NOTE — Progress Notes (Signed)
  Echocardiogram 2D Echocardiogram has been performed.  Dandra Shambaugh T Larz Mark 08/29/2017, 2:37 PM

## 2017-08-30 ENCOUNTER — Ambulatory Visit
Admission: RE | Admit: 2017-08-30 | Discharge: 2017-08-30 | Disposition: A | Discharge status: Home / Self Care | Payer: Medicaid Other | Source: Ambulatory Visit | Attending: Radiation Oncology | Admitting: Radiation Oncology

## 2017-08-30 ENCOUNTER — Ambulatory Visit: Payer: Self-pay | Admitting: Family Medicine

## 2017-08-30 ENCOUNTER — Inpatient Hospital Stay (HOSPITAL_COMMUNITY): Payer: Medicaid Other

## 2017-08-30 LAB — COMPREHENSIVE METABOLIC PANEL
ALK PHOS: 100 U/L (ref 38–126)
ALT: 49 U/L (ref 17–63)
ALT: 42 U/L (ref 17–63)
AST: 19 U/L (ref 15–41)
AST: 17 U/L (ref 15–41)
Albumin: 2 g/dL — ABNORMAL LOW (ref 3.5–5.0)
Albumin: 2.1 g/dL — ABNORMAL LOW (ref 3.5–5.0)
Alkaline Phosphatase: 105 U/L (ref 38–126)
Anion gap: 7 (ref 5–15)
Anion gap: 6 (ref 5–15)
BILIRUBIN TOTAL: 0.3 mg/dL (ref 0.3–1.2)
BUN: <5 mg/dL — ABNORMAL LOW (ref 6–20)
CALCIUM: 8.3 mg/dL — AB (ref 8.9–10.3)
CALCIUM: 8.7 mg/dL — AB (ref 8.9–10.3)
CO2: 39 mmol/L — ABNORMAL HIGH (ref 22–32)
CO2: 44 mmol/L — AB (ref 22–32)
CREATININE: 0.47 mg/dL — AB (ref 0.61–1.24)
CREATININE: 0.42 mg/dL — AB (ref 0.61–1.24)
Chloride: 88 mmol/L — ABNORMAL LOW (ref 101–111)
Chloride: 83 mmol/L — ABNORMAL LOW (ref 101–111)
GFR calc Af Amer: >60 mL/min (ref >60)
GFR calc non Af Amer: >60 mL/min (ref >60)
Glucose, Bld: 114 mg/dL — ABNORMAL HIGH (ref 65–99)
Glucose, Bld: 126 mg/dL — ABNORMAL HIGH (ref 65–99)
Potassium: 2.9 mmol/L — ABNORMAL LOW (ref 3.5–5.1)
Potassium: 3.2 mmol/L — ABNORMAL LOW (ref 3.5–5.1)
Sodium: 134 mmol/L — ABNORMAL LOW (ref 135–145)
Sodium: 133 mmol/L — ABNORMAL LOW (ref 135–145)
TOTAL PROTEIN: 5.6 g/dL — AB (ref 6.5–8.1)
Total Bilirubin: 0.4 mg/dL (ref 0.3–1.2)
Total Protein: 5.7 g/dL — ABNORMAL LOW (ref 6.5–8.1)

## 2017-08-30 LAB — CULTURE, BLOOD (ROUTINE X 2)
Culture: NO GROWTH
Culture: NO GROWTH
Special Requests: ADEQUATE

## 2017-08-30 LAB — ECHOCARDIOGRAM COMPLETE
Height: 65 in
WEIGHTICAEL: 2748.8 [oz_av]

## 2017-08-30 LAB — CBC
HCT: 26 % — ABNORMAL LOW (ref 39.0–52.0)
HEMOGLOBIN: 8 g/dL — AB (ref 13.0–17.0)
MCH: 28.2 pg (ref 26.0–34.0)
MCHC: 30.8 g/dL (ref 30.0–36.0)
MCV: 91.5 fL (ref 78.0–100.0)
Platelets: 577 10*3/uL — ABNORMAL HIGH (ref 150–400)
RBC: 2.84 MIL/uL — AB (ref 4.22–5.81)
RDW: 16.5 % — ABNORMAL HIGH (ref 11.5–15.5)
WBC: 10.1 10*3/uL (ref 4.0–10.5)

## 2017-08-30 LAB — TSH: TSH: 5.458 u[IU]/mL — AB (ref 0.350–4.500)

## 2017-08-30 MED ORDER — GADOBENATE DIMEGLUMINE 529 MG/ML IV SOLN
20.0000 mL | Freq: Once | INTRAVENOUS | Status: AC | PRN
  Administered 2017-08-30: 16 mL via INTRAVENOUS

## 2017-08-30 MED ORDER — POTASSIUM CHLORIDE CRYS ER 20 MEQ PO TBCR
30.0000 meq | EXTENDED_RELEASE_TABLET | Freq: Two times a day (BID) | ORAL | Status: AC
  Administered 2017-08-30 – 2017-08-31 (×2): 30 meq via ORAL
  Filled 2017-08-30 (×2): qty 1

## 2017-08-30 NOTE — Progress Notes (Signed)
Patient ID: Levi Brown, male   DOB: 04-14-1956, 61 y.o.   MRN: [id number]                                                                PROGRESS NOTE                                                                                                                                                                                                             Patient Demographics:    Levi Brown, is a 61 y.o. male, DOB - December 26, 1955, OVF:[id number]  Admit date - 08/25/2017   Admitting Physician Juanito Doom, MD  Outpatient Primary MD for the patient is [name], FNP  LOS - 4  Outpatient Specialists:    Chief Complaint  Patient presents with  . Respiratory Distress       Brief Narrative  61 yr old male with diagnosis of stage IV NSCLC undergoing radiation therapy coming in with progressive shortness of breath for the last 2 weeks. He noticed some swelling of his lower limbs so he was started on lasix by his PCP but did not help. He called for help today and was found to have pulse ox in the 60s. Patient was in the ED taken to CT scan and after he came back he became unresponsive with worsening respiratory status so he was intubated.   As per sister patient did not have any fever or chills but did have sputum production and cough. No chest pain or lower extremity pain. Ct chest showed moderate left effusion with left upper lobe collapse from known mediastinal mass. (squamous cell carcinoma)    Subjective:    [name] today states he is feeling well. Breathing stable.  No dizziness currently.  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   No headache, No chest pain, No abdominal pain - No Nausea, No new weakness tingling or numbness, No Cough - SOB.    Assessment  & Plan :    Active Problems:   Non-small cell cancer of left lung (HCC)   Acute respiratory failure with hypoxemia (HCC)   Pleural effusion on left   Collapse of left lung   Hyponatremia   COPD  exacerbation (HCC)   Obstructive pneumonia   Hypokalemia   Large  left pleural effusion > exudative, =>atypical cells seen, difficult to tell if due to malignancy  Large left lung mass> squamous cell carcinoma Left lung atelectasis Acute respiratory failure with hypoxemia > improving COPD  Continue duoneb prn Cont Breo OK for radiation therapy today from my perspective Will need pleural drainage catheter (Pleur-X) if cytology is positive per PCCM D/w McQuaid , if patient is stable clinically which he appears to be no pleur-X Cont Zosyn  Dizziness MRI brain w and w/o contrast => negative metastatic disease  Sinus tachycardia , CTA chest 9/12=> negative for PE Check tsh Cardiac echo=> 9/17=> EF 55-60%  Right sided aortic arch Tele  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 possible Follow uprespiratory culture DC vanco 9/15 Continue zosyn     FAMILY - Updates: updated sister and girlfriend bedside on 9/13 - Goals of care: needs palliative care or interdisciplinary meeting with family by 9/20   Code Status:DNR  Family Communication :w patient  Disposition Plan:home  Barriers For Discharge:  Consults :PCCM  Procedures :   STUDIES: 08/26/2017 CT angiogram chest> no pulmonary embolism, large left sided effusion and left sided mass both causing compressive atelectasis of the left lung, no PE  CULTURES: 9/12 blood > 9/13 resp culture >moderate GPC and GPR 9/14 pleural > gram stain negative  ANTIBIOTICS: 9/13 vanc >9/15 9/13 zosyn >  SIGNIFICANT EVENTS:   LINES/TUBES: 9/13 ETT >9/14    DVT Prophylaxis: Heparin,. SCDs      Lab Results  Component Value Date   PLT 577 (H) 08/30/2017       Anti-infectives    Start     Dose/Rate Route Frequency Ordered Stop   08/26/17 2200  vancomycin (VANCOCIN) IVPB 1000 mg/200 mL premix  Status:  Discontinued     1,000 mg 200 mL/hr over 60 Minutes Intravenous Every 12 hours 08/26/17 1047 08/28/17 1029   08/26/17 0800  vancomycin (VANCOCIN) IVPB 750 mg/150 ml premix  Status:  Discontinued     750 mg 150 mL/hr over 60 Minutes Intravenous Every 8 hours 08/26/17 0419 08/26/17 1047   08/26/17 0600  piperacillin-tazobactam (ZOSYN) IVPB 3.375 g     3.375 g 12.5 mL/hr over 240 Minutes Intravenous Every 8 hours 08/26/17 0419     08/26/17 0045  vancomycin (VANCOCIN) IVPB 1000 mg/200 mL premix     1,000 mg 200 mL/hr over 60 Minutes Intravenous  Once 08/26/17 0030 08/26/17 0156   08/26/17 0045  piperacillin-tazobactam (ZOSYN) IVPB 3.375 g     3.375 g 100 mL/hr over 30 Minutes Intravenous  Once 08/26/17 0030 08/26/17 0238   08/25/17 1930  cefTRIAXone (ROCEPHIN) 1 g in dextrose 5 % 50 mL IVPB     1 g 100 mL/hr over 30 Minutes Intravenous  Once 08/25/17 1928 08/26/17 0043   08/25/17 1930  azithromycin (ZITHROMAX) 500 mg in dextrose 5 % 250 mL IVPB     500 mg 250 mL/hr over 60 Minutes Intravenous  Once 08/25/17 1928 08/25/17 2333        Objective:   Vitals:   08/30/17 0503 08/30/17 0901 08/30/17 1040 08/30/17 1524  BP: 109/76  110/80 111/77  Pulse: (!) 111  (!) 113 (!) 108  Resp: 20   20  Temp: 98.5 F (36.9 C)   97.7 F (36.5 C)  TempSrc: Oral  Oral  SpO2: 100% 97%  97%  Weight:      Height:        Wt Readings from Last 3 Encounters:  08/29/17 77.9 kg (171 lb 12.8 oz)  08/23/17 72.5 kg (159 lb 12.8 oz)  08/17/17 71.5 kg (157 lb 9.6 oz)     Intake/Output Summary (Last 24 hours) at 08/30/17 1752 Last data filed at 08/30/17 1535  Gross per 24 hour  Intake          3498.33 ml  Output                0 ml  Net          3498.33 ml     Physical Exam  Awake Alert, Oriented X 3, No new F.N deficits, Normal  affect [place].AT,PERRAL Supple Neck,No JVD, No cervical lymphadenopathy appriciated.  Symmetrical Chest wall movement, Good air movement bilaterally, slight decrease in bs at left lung base RRR,No Gallops,Rubs or new Murmurs, No Parasternal Heave +ve B.Sounds, Abd Soft, No tenderness, No organomegaly appriciated, No rebound - guarding or rigidity. No Cyanosis, Clubbing or edema, No new Rash or bruise      Data Review:    CBC  Recent Labs Lab 08/25/17 1728 08/27/17 0148 08/28/17 1053 08/29/17 0658 08/30/17 0732  WBC 10.6* 12.3* 12.4* 9.7 10.1  HGB 8.2* 7.5* 7.7* 8.5* 8.0*  HCT 25.0* 23.0* 24.3* 26.9* 26.0*  PLT 668* 653* 640* 585* 577*  MCV 86.5 88.8 91.4 92.4 91.5  MCH 28.4 29.0 28.9 29.2 28.2  MCHC 32.8 32.6 31.7 31.6 30.8  RDW 15.8* 16.2* 16.4* 16.5* 16.5*  LYMPHSABS 0.6* 0.5*  --   --   --   MONOABS 0.9 0.5  --   --   --   EOSABS 0.1 0.0  --   --   --   BASOSABS 0.0 0.0  --   --   --     Chemistries   Recent Labs Lab 08/25/17 1728  08/27/17 1328 08/27/17 1909 08/28/17 0621 08/28/17 1053 08/29/17 0658 08/30/17 0732  NA 117*  < > 132* 132*  --  132* 134* 133*  K 3.9  < > 3.6 3.4*  --  3.5 2.9* 3.2*  CL 72*  < > 88* 87*  --  88* 88* 83*  CO2 36*  < > 36* 36*  --  37* 39* 44*  GLUCOSE 119*  < > 98 100*  --  109* 114* 126*  BUN 6  < > <5* <5*  --  <5* <5* <5*  CREATININE 0.40*  < > 0.51* 0.51* 0.53* 0.54* 0.47* 0.42*  CALCIUM 8.4*  < > 8.5* 8.4*  --  8.4* 8.3* 8.7*  AST 25  --   --   --   --  25 19 17   ALT 72*  --   --   --   --  54 49 42  ALKPHOS 161*  --   --   --   --  113 105 100  BILITOT 0.2*  --   --   --   --  0.5 0.3 0.4  < > = values in this interval not displayed. ------------------------------------------------------------------------------------------------------------------ No results for input(s): CHOL, HDL, LDLCALC, TRIG, CHOLHDL, LDLDIRECT in the last 72 hours.  Lab Results  Component Value Date   HGBA1C 4.9 07/08/2017    ------------------------------------------------------------------------------------------------------------------  Recent Labs  08/30/17 0732  TSH 5.458*   ------------------------------------------------------------------------------------------------------------------ No results for input(s): VITAMINB12, FOLATE, FERRITIN, TIBC, IRON, RETICCTPCT  in the last 72 hours.  Coagulation profile  Recent Labs Lab 08/26/17 0204  INR 1.17    No results for input(s): DDIMER in the last 72 hours.  Cardiac Enzymes  Recent Labs Lab 08/25/17 1728  TROPONINI <0.03   ------------------------------------------------------------------------------------------------------------------    Component Value Date/Time   BNP 219.2 (H) 08/25/2017 1728    Inpatient Medications  Scheduled Meds: . chlorhexidine  15 mL Mouth Rinse BID  . fluticasone furoate-vilanterol  1 puff Inhalation Daily  . heparin  5,000 Units Subcutaneous Q8H  . mouth rinse  15 mL Mouth Rinse q12n4p  . metoprolol tartrate  25 mg Oral BID  . pantoprazole  40 mg Oral QHS   Continuous Infusions: . sodium chloride    . sodium chloride 100 mL/hr at 08/30/17 1225  . piperacillin-tazobactam (ZOSYN)  IV 3.375 g (08/30/17 1514)   PRN Meds:.sodium chloride, fentaNYL (SUBLIMAZE) injection, gi cocktail, ipratropium-albuterol, ondansetron (ZOFRAN) IV, phenol  Micro Results Recent Results (from the past 240 hour(s))  Blood culture (routine x 2)     Status: None   Collection Time: 08/25/17  5:25 PM  Result Value Ref Range Status   Specimen Description BLOOD LEFT HAND  Final   Special Requests   Final    IN PEDIATRIC BOTTLE Blood Culture results may not be optimal due to an excessive volume of blood received in culture bottles   Culture   Final    NO GROWTH 5 DAYS Performed at [facility], [place] [address], [place], [place] [address]    Report Status 08/30/2017 FINAL  Final  Blood culture (routine x 2)     Status: None    Collection Time: 08/25/17  5:27 PM  Result Value Ref Range Status   Specimen Description BLOOD LEFT ANTECUBITAL  Final   Special Requests   Final    BOTTLES DRAWN AEROBIC AND ANAEROBIC Blood Culture adequate volume   Culture   Final    NO GROWTH 5 DAYS Performed at [facility], [place] [address], [place], [place] [address]    Report Status 08/30/2017 FINAL  Final  MRSA PCR Screening     Status: None   Collection Time: 08/26/17  1:31 AM  Result Value Ref Range Status   MRSA by PCR NEGATIVE NEGATIVE Final    Comment:        The GeneXpert MRSA Assay (FDA approved for NASAL specimens only), is one component of a comprehensive MRSA colonization surveillance program. It is not intended to diagnose MRSA infection nor to guide or monitor treatment for MRSA infections.   Culture, respiratory (NON-Expectorated)     Status: None   Collection Time: 08/26/17  9:48 AM  Result Value Ref Range Status   Specimen Description TRACHEAL ASPIRATE  Final   Special Requests NONE  Final   Gram Stain   Final    ABUNDANT WBC PRESENT, PREDOMINANTLY PMN MODERATE GRAM POSITIVE RODS MODERATE GRAM POSITIVE COCCI    Culture   Final    Consistent with normal respiratory flora. Performed at [facility], [place] [address] [place], [place] [address]    Report Status 08/28/2017 FINAL  Final  Body fluid culture (includes gram stain)     Status: None   Collection Time: 08/26/17 10:45 AM  Result Value Ref Range Status   Specimen Description PLEURAL  Final   Special Requests NONE  Final   Gram Stain   Final    FEW WBC PRESENT,BOTH PMN AND MONONUCLEAR NO ORGANISMS  SEEN    Culture   Final    NO GROWTH 3 DAYS Performed at [facility], [place] [address], [place], [place] [address]    Report Status 08/29/2017 FINAL  Final    Radiology Reports Dg Chest 2 View  Result Date: 08/29/2017 CLINICAL DATA:  Shortness of breath. History of lung cancer. Pleural effusion. EXAM: CHEST  2 VIEW  COMPARISON:  08/27/2017 FINDINGS: The patient has a known large left hilar mass invading the mediastinum and a drowned lung appearance of the left upper lobe on CT scan from 4 days ago. There is also previously a significant left pleural effusion. Volume loss is again observed in the left hemithorax with elevation of left hemidiaphragm. Left pleural effusion again noted. The amount of aerated left lung, while small, is slightly improved from the 08/27/2017 exam. Right-sided aortic arch. Subtle blunting of the right costophrenic angle. IMPRESSION: 1. Minimally improved aeration in the left lung although only a small portion of the lung is aerated. Opacification in much of the left hemithorax due to known mass common drowned lung, and large left pleural effusion. 2. Trace right pleural effusion. 3. Right-sided aortic arch noted. Electronically Signed   By: Van Clines M.D.   On: 08/29/2017 12:17   Ct Angio Chest Pe W Or Wo Contrast  Result Date: 08/25/2017 CLINICAL DATA:  Dyspnea, orthostasis, confusion and tachycardia. Hypoxia with pulse oximeter 67%. Patient canceled radiation therapy today due to progressive weakness. Has not started chemotherapy. EXAM: CT ANGIOGRAPHY CHEST WITH CONTRAST TECHNIQUE: Multidetector CT imaging of the chest was performed using the standard protocol during bolus administration of intravenous contrast. Multiplanar CT image reconstructions and MIPs were obtained to evaluate the vascular anatomy. CONTRAST:  100 cc Isovue 370 IV COMPARISON:  None. FINDINGS: Cardiovascular: Right-sided aortic arch with aberrant left subclavian artery. No acute pulmonary embolus. Marked narrowing of the left main pulmonary artery secondary to previously described mediastinal soft tissue mass. No aortic aneurysm or dissection. Heart size is normal without pericardial effusion. Mediastinum/Nodes: The previously described soft tissue mass within the superior mediastinal causing mass effect on the left  main pulmonary artery, obliterating the left upper lobe bronchus and impinging upon the left upper lobe bronchus is less distinct due to adjacent new left upper lobe atelectasis likely secondary to postobstructive involvement of the left upper lobe bronchus Lungs/Pleura: New moderate left effusion with new left lower lobe compressive atelectasis. Postobstructive collapse of the left upper lobe is more expensive since prior exam with only a scant amount of aerated lingula remaining. The contralateral right lung remains aerated without pneumonic consolidation or effusion. No pneumothorax. Upper Abdomen: No acute abnormality. Musculoskeletal: No chest wall abnormality. No acute or significant osseous findings. Review of the MIP images confirms the above findings. IMPRESSION: 1. New moderate left-sided pleural effusion with left lower lobe compressive atelectasis. Only a scant amount of aerated lung remains within the left hemithorax. 2. Further progression of left upper lobe collapse likely postobstructive in etiology secondary to previously described mediastinal soft tissue mass. Where the margins of this mass ends and the beginning of atelectatic lung begins is difficult to determine due to similar soft tissue attenuation of these abutting abnormalities. This mass is again noted to obstruct the left upper lobe bronchus, markedly narrowing the left main pulmonary artery and partially narrow the left lower lobe bronchus. 3. Right-sided aortic arch with aberrant left subclavian artery. Electronically Signed   By: Ashley Royalty M.D.   On: 08/25/2017 21:37  Mr Jeri Cos Wo Contrast  Result Date: 08/30/2017 CLINICAL DATA:  Metastatic lung cancer.  Progressive dizziness. EXAM: MRI HEAD WITHOUT AND WITH CONTRAST TECHNIQUE: Multiplanar, multiecho pulse sequences of the brain and surrounding structures were obtained without and with intravenous contrast. CONTRAST:  20 cc MultiHance COMPARISON:  07/07/2017 FINDINGS: Brain:  Diffusion imaging does not show any acute or subacute infarction. The brainstem and cerebellum are normal except for mild chronic small-vessel change of the pons. 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 No large vessel territory infarction. No mass lesion, hemorrhage, hydrocephalus or extra-axial collection. Incidental venous angioma right frontal lobe. Vascular: Major vessels at the base of the brain show flow. Skull and upper cervical spine: Negative Sinuses/Orbits: Clear/normal Other: None IMPRESSION: No acute finding.  No evidence of metastatic disease. 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 Electronically Signed   By: Nelson Chimes M.D.   On: 08/30/2017 07:28   Dg Chest Port 1 View  Result Date: 08/27/2017 CLINICAL DATA:  Acute respiratory failure with hypoxia. History of left lung cancer. EXAM: PORTABLE CHEST 1 VIEW COMPARISON:  08/26/2017 FINDINGS: Exam demonstrates stable postsurgical change with volume loss of the left lung with elevation of the left hemidiaphragm. There is worsening opacification over the mid to lower left lung likely worsening effusion with atelectasis. Cannot exclude infection in the mid to lower left lung. Right lung is clear. Cardiomediastinal silhouette and remainder of the exam is unchanged. IMPRESSION: Interval worsening of small left pleural effusion likely with associated atelectasis. Cannot exclude infection in the left base. Stable postsurgical change/ volume loss of the left lung. Electronically Signed   By: Marin Olp M.D.   On: 08/27/2017 07:08   Dg Chest Port 1 View  Result Date: 08/26/2017 CLINICAL DATA:  Status post left side thoracentesis. 500cc removed. Hx of left lung mass. EXAM: PORTABLE CHEST 1 VIEW COMPARISON:  08/25/2017 FINDINGS: There is persistent elevation of left hemidiaphragm. No  pneumothorax. Mediastinal mass again identified. The right lung is clear. No pulmonary edema. IMPRESSION: No pneumothorax following thoracentesis. Electronically Signed   By: Nolon Nations M.D.   On: 08/26/2017 11:31   Dg Chest Portable 1 View  Result Date: 08/25/2017 CLINICAL DATA:  Intubation and NG tube placement. EXAM: PORTABLE CHEST 1 VIEW COMPARISON:  Radiographs and chest CT earlier this day. FINDINGS: Endotracheal tube tip 5.2 cm from the carina. Enteric tube in place, and tip below the diaphragm not included in the field of view. Again seen volume loss in the left hemithorax with left pleural effusion. Right paratracheal density corresponds to right aortic arch. Right lung is clear. IMPRESSION: 1. Endotracheal tube tip 5.2 cm from the carina. Enteric tube in place, tip below the diaphragm not included in the field of view. 2. Unchanged left lung volume loss and left pleural effusion. Mediastinal mass evaluated on chest CT earlier this day. Electronically Signed   By: Jeb Levering M.D.   On: 08/25/2017 22:32   Dg Chest Port 1 View  Result Date: 08/25/2017 CLINICAL DATA:  Shortness of breath. History of LEFT lung cancer, RIGHT-sided aortic arch with vascular loop. EXAM: PORTABLE CHEST 1 VIEW COMPARISON:  PET-CT July 29, 2017 FINDINGS: Elevated LEFT hemidiaphragm, LEFT lung volume loss with perihilar consolidation with air bronchograms. Small LEFT pleural effusion. RIGHT lung is clear. The cardiac silhouette is mildly enlarged, limited assessment due to LEFT process. No  pneumothorax. Soft tissue planes and included osseous structures are nonsuspicious, known osseous metastasis not apparent by plain radiography. IMPRESSION: Worsening LEFT lung atelectasis and consolidation/pneumonia, with underlying known LEFT lung mass. Small LEFT pleural effusion. Electronically Signed   By: Elon Alas M.D.   On: 08/25/2017 19:08    Time Spent in minutes  30   Jani Gravel M.D on 08/30/2017 at 5:52  PM  Between 7am to 7pm - Pager - [phone]  After 7pm go to [email or web address] - password [email or web address]  [facility] -  Office  [phone]

## 2017-08-30 NOTE — Care Management Note (Signed)
Case Management Note  Patient Details  Name: Levi Brown MRN: [id number] Date of Birth: Jan 07, 1956  Subjective/Objective:                  Dyspnea and 02 needs  Action/Plan: Date:  August 30, 2017 Chart reviewed for concurrent status and case management needs.  Will continue to follow patient progress.  Discharge Planning: following for needs  Expected discharge date: [id number]  Velva Harman, BSN, [facility], [place]   Expected Discharge Date:   (UNKNOWN)               Expected Discharge Plan:  Home/Self Care  In-House Referral:     Discharge planning Services  CM Consult  Post Acute Care Choice:    Choice offered to:     DME Arranged:    DME Agency:     HH Arranged:    HH Agency:     Status of Service:  In process, will continue to follow  If discussed at Long Length of Stay Meetings, dates discussed:    Additional Comments:  Leeroy Cha, RN 08/30/2017, 9:21 AM

## 2017-08-31 ENCOUNTER — Ambulatory Visit
Admission: RE | Admit: 2017-08-31 | Discharge: 2017-08-31 | Disposition: A | Discharge status: Home / Self Care | Payer: Medicaid Other | Source: Ambulatory Visit | Attending: Radiation Oncology | Admitting: Radiation Oncology

## 2017-08-31 DIAGNOSIS — R609 Edema, unspecified: Secondary | ICD-10-CM

## 2017-08-31 LAB — COMPREHENSIVE METABOLIC PANEL
ALBUMIN: 2.1 g/dL — AB (ref 3.5–5.0)
ALT: 37 U/L (ref 17–63)
AST: 14 U/L — AB (ref 15–41)
Alkaline Phosphatase: 100 U/L (ref 38–126)
Anion gap: 6 (ref 5–15)
CHLORIDE: 83 mmol/L — AB (ref 101–111)
CO2: 44 mmol/L — ABNORMAL HIGH (ref 22–32)
Calcium: 8.4 mg/dL — ABNORMAL LOW (ref 8.9–10.3)
Creatinine, Ser: 0.42 mg/dL — ABNORMAL LOW (ref 0.61–1.24)
GFR calc Af Amer: >60 mL/min (ref >60)
Glucose, Bld: 113 mg/dL — ABNORMAL HIGH (ref 65–99)
POTASSIUM: 3.4 mmol/L — AB (ref 3.5–5.1)
SODIUM: 133 mmol/L — AB (ref 135–145)
Total Bilirubin: 0.5 mg/dL (ref 0.3–1.2)
Total Protein: 5.5 g/dL — ABNORMAL LOW (ref 6.5–8.1)

## 2017-08-31 LAB — CBC
HCT: 24.6 % — ABNORMAL LOW (ref 39.0–52.0)
Hemoglobin: 7.7 g/dL — ABNORMAL LOW (ref 13.0–17.0)
MCH: 29.2 pg (ref 26.0–34.0)
MCHC: 31.3 g/dL (ref 30.0–36.0)
MCV: 93.2 fL (ref 78.0–100.0)
PLATELETS: 555 10*3/uL — AB (ref 150–400)
RBC: 2.64 MIL/uL — ABNORMAL LOW (ref 4.22–5.81)
RDW: 17.2 % — AB (ref 11.5–15.5)
WBC: 10.9 10*3/uL — AB (ref 4.0–10.5)

## 2017-08-31 MED ORDER — IPRATROPIUM-ALBUTEROL 0.5-2.5 (3) MG/3ML IN SOLN: 3.0000 mL | RESPIRATORY_TRACT | 0 refills | Status: AC | PRN

## 2017-08-31 MED ORDER — POTASSIUM CHLORIDE 10 MEQ/100ML IV SOLN
10.0000 meq | INTRAVENOUS | Status: DC
  Filled 2017-08-31 (×4): qty 100

## 2017-08-31 MED ORDER — PANTOPRAZOLE SODIUM 40 MG PO TBEC: 40.0000 mg | DELAYED_RELEASE_TABLET | Freq: Every day | ORAL | 0 refills | Status: DC

## 2017-08-31 MED ORDER — POTASSIUM CHLORIDE CRYS ER 20 MEQ PO TBCR
20.0000 meq | EXTENDED_RELEASE_TABLET | Freq: Once | ORAL | Status: AC
  Administered 2017-08-31: 20 meq via ORAL

## 2017-08-31 MED ORDER — FLUTICASONE FUROATE-VILANTEROL 100-25 MCG/INH IN AEPB: 1.0000 | INHALATION_SPRAY | Freq: Every day | RESPIRATORY_TRACT | 0 refills | Status: DC

## 2017-08-31 MED ORDER — BOOST / RESOURCE BREEZE PO LIQD
1.0000 | Freq: Three times a day (TID) | ORAL | Status: DC
  Administered 2017-08-31 – 2017-09-04 (×8): 1 via ORAL

## 2017-08-31 MED ORDER — FUROSEMIDE 10 MG/ML IJ SOLN
20.0000 mg | Freq: Once | INTRAMUSCULAR | Status: AC
  Administered 2017-08-31: 20 mg via INTRAVENOUS
  Filled 2017-08-31: qty 2

## 2017-08-31 MED ORDER — AMOXICILLIN-POT CLAVULANATE 875-125 MG PO TABS: 1.0000 | ORAL_TABLET | Freq: Two times a day (BID) | ORAL | 0 refills | Status: DC

## 2017-08-31 MED ORDER — GADOBENATE DIMEGLUMINE 529 MG/ML IV SOLN
20.0000 mL | Freq: Once | INTRAVENOUS | Status: AC | PRN
  Administered 2017-08-30: 20 mL via INTRAVENOUS

## 2017-08-31 NOTE — Progress Notes (Signed)
Patient ID: Levi Brown, male   DOB: [birth year], 61 y.o.   MRN: [id number]                                                                PROGRESS NOTE                                                                                                                                                                                                             Patient Demographics:    Levi Brown, is a 61 y.o. male, DOB - 1956-04-15, OIN:[id number]  Admit date - 08/25/2017   Admitting Physician Juanito Doom, MD  Outpatient Primary MD for the patient is [name], FNP  LOS - 5  Outpatient Specialists:     Chief Complaint  Patient presents with  . Respiratory Distress       Brief Narrative   61 yr old male with diagnosis of stage IV NSCLC undergoing radiation therapy coming in with progressive shortness of breath for the last 2 weeks. He noticed some swelling of his lower limbs so he was started on lasix by his PCP but did not help. He called for help today and was found to have pulse ox in the 60s. Patient was in the ED taken to CT scan and after he came back he became unresponsive with worsening respiratory status so he was intubated.   As per sister patient did not have any fever or chills but did have sputum production and cough. No chest pain or lower extremity pain. Ct chest showed moderate left effusion with left upper lobe collapse from known mediastinal mass. (squamous cell carcinoma)   Subjective:    Levi Brown today has been feeling tired this after noon after radiation pox 97% on RA.  Slight dry cough. Afebrile.    No headache, No chest pain, No abdominal pain - No Nausea, No new weakness tingling or numbness.  S   Assessment  & Plan :    Active Problems:   Non-small cell cancer of left lung (HCC)   Acute respiratory failure with hypoxia and hypercapnia (HCC)   Pleural effusion on left   Collapse of left lung   Hyponatremia   COPD exacerbation (HCC)  Obstructive pneumonia   Hypokalemia    Large left pleural  effusion > exudative, =>atypical cells seen, difficult to tell if due to malignancy  Large left lung mass> squamous cell carcinoma Left lung atelectasis Acute respiratory failure with hypoxemia > improving COPD  Continue duoneb prn Cont Breo OK for radiation therapy today from my perspective Will need pleural drainage catheter (Pleur-X) if cytology is positive per PCCM D/w McQuaid , if patient is stable clinically which he appears to be no pleur-X Cont Zosyn Pt feels extremely fatigued after radiation , was hoping to discharge today but anticipate d/c tomorrow. I discussed this with his daughter.   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 possible Follow uprespiratory culture DC vanco 9/15 Continue zosyn  Edema Lasix 20mg  iv x1,  Kcl 20 meq po x1  Code Status:DNR  Family Communication :w patient  Disposition Plan:home  Barriers For Discharge:  Consults :PCCM  Procedures :   STUDIES: 08/26/2017 CT angiogram chest> no pulmonary embolism, large left sided effusion and left sided mass both causing compressive atelectasis of the left lung, no PE  CULTURES: 9/12 blood > 9/13 resp culture >moderate GPC and GPR 9/14 pleural > gram stain negative  ANTIBIOTICS: 9/13 vanc >9/15 9/13 zosyn >  SIGNIFICANT EVENTS:   LINES/TUBES: 9/13 ETT >9/14    DVT Prophylaxis: Heparin,. SCDs      Lab Results  Component Value Date   PLT 555 (H) 08/31/2017       Anti-infectives    Start     Dose/Rate Route Frequency Ordered Stop   08/31/17 0000  amoxicillin-clavulanate (AUGMENTIN) 875-125 MG tablet     1 tablet Oral 2 times daily 08/31/17 0706 09/02/17 2359   08/26/17 2200  vancomycin (VANCOCIN) IVPB 1000 mg/200 mL premix  Status:  Discontinued     1,000 mg 200 mL/hr over 60 Minutes Intravenous Every 12 hours 08/26/17 1047 08/28/17 1029   08/26/17 0800  vancomycin (VANCOCIN) IVPB 750 mg/150 ml premix  Status:  Discontinued     750 mg 150 mL/hr over 60 Minutes Intravenous Every 8 hours 08/26/17 0419 08/26/17 1047   08/26/17 0600  piperacillin-tazobactam (ZOSYN) IVPB 3.375 g     3.375 g 12.5 mL/hr over 240 Minutes Intravenous Every 8 hours 08/26/17 0419     08/26/17 0045  vancomycin (VANCOCIN) IVPB 1000 mg/200 mL premix     1,000 mg 200 mL/hr over 60 Minutes Intravenous  Once 08/26/17 0030 08/26/17 0156   08/26/17 0045  piperacillin-tazobactam (ZOSYN) IVPB 3.375 g     3.375 g 100 mL/hr over 30 Minutes Intravenous  Once 08/26/17 0030 08/26/17 0238   08/25/17 1930  cefTRIAXone (ROCEPHIN) 1 g in dextrose 5 % 50 mL IVPB     1 g 100 mL/hr over 30 Minutes Intravenous  Once 08/25/17 1928 08/26/17 0043   08/25/17 1930  azithromycin (ZITHROMAX) 500 mg in dextrose 5 % 250 mL IVPB     500 mg 250 mL/hr over 60 Minutes Intravenous  Once 08/25/17 1928 08/25/17 2333        Objective:   Vitals:   08/30/17 1524 08/31/17 0524 08/31/17 0538 08/31/17 1500  BP: 111/77 111/63  118/80  Pulse: Marland Kitchen)  108 (!) 111  97  Resp: 20 20  18   Temp: 97.7 F (36.5 C) 99 F (37.2 C)  98.4 F (36.9 C)  TempSrc: Oral Oral  Oral  SpO2: 97% 100%  100%  Weight:   71 kg (156 lb 8 oz)   Height:        Wt Readings from Last 3 Encounters:  08/31/17 71 kg (156 lb 8 oz)  08/23/17 72.5 kg (159 lb 12.8 oz)  08/17/17 71.5 kg (157 lb 9.6 oz)     Intake/Output Summary (Last 24 hours) at 08/31/17 1805 Last data filed at 08/31/17 1406  Gross per 24 hour  Intake           2641.67 ml  Output              900 ml  Net          1741.67 ml     Physical Exam  Awake Alert, Oriented X 3, No new F.N deficits, Normal affect [place].AT,PERRAL Supple Neck,No JVD, No cervical lymphadenopathy appriciated.  Symmetrical Chest wall movement, Good air movement bilaterally, CTAB RRR,No Gallops,Rubs or new Murmurs, No Parasternal Heave +ve B.Sounds, Abd Soft, No tenderness, No organomegaly appriciated, No rebound - guarding or rigidity. No Cyanosis, Clubbing No new Rash or bruise   Trace edema     Data Review:    CBC  Recent Labs Lab 08/25/17 1728 08/27/17 0148 08/28/17 1053 08/29/17 0658 08/30/17 0732 08/31/17 0644  WBC 10.6* 12.3* 12.4* 9.7 10.1 10.9*  HGB 8.2* 7.5* 7.7* 8.5* 8.0* 7.7*  HCT 25.0* 23.0* 24.3* 26.9* 26.0* 24.6*  PLT 668* 653* 640* 585* 577* 555*  MCV 86.5 88.8 91.4 92.4 91.5 93.2  MCH 28.4 29.0 28.9 29.2 28.2 29.2  MCHC 32.8 32.6 31.7 31.6 30.8 31.3  RDW 15.8* 16.2* 16.4* 16.5* 16.5* 17.2*  LYMPHSABS 0.6* 0.5*  --   --   --   --   MONOABS 0.9 0.5  --   --   --   --   EOSABS 0.1 0.0  --   --   --   --   BASOSABS 0.0 0.0  --   --   --   --     Chemistries   Recent Labs Lab 08/25/17 1728  08/27/17 1909 08/28/17 0621 08/28/17 1053 08/29/17 0658 08/30/17 0732 08/31/17 0644  NA 117*  < > 132*  --  132* 134* 133* 133*  K 3.9  < > 3.4*  --  3.5 2.9* 3.2* 3.4*  CL 72*  < > 87*  --  88* 88* 83* 83*  CO2 36*  < > 36*  --  37* 39* 44* 44*  GLUCOSE 119*  < > 100*  --  109* 114* 126* 113*  BUN 6  < > <5*  --  <5* <5* <5* <5*  CREATININE 0.40*  < > 0.51* 0.53* 0.54* 0.47* 0.42* 0.42*  CALCIUM 8.4*  < > 8.4*  --  8.4* 8.3* 8.7* 8.4*  AST 25  --   --   --  25 19 17  14*  ALT 72*  --   --   --  54 49 42 37  ALKPHOS 161*  --   --   --  113 105 100 100  BILITOT 0.2*  --   --   --  0.5 0.3 0.4 0.5  < > = values in this interval not  displayed. ------------------------------------------------------------------------------------------------------------------ No results for input(s): CHOL, HDL, LDLCALC, TRIG, CHOLHDL, LDLDIRECT  in the last 72 hours.  Lab Results  Component Value Date   HGBA1C 4.9 07/08/2017   ------------------------------------------------------------------------------------------------------------------  Recent Labs  08/30/17 0732  TSH 5.458*   ------------------------------------------------------------------------------------------------------------------ No results for input(s): VITAMINB12, FOLATE, FERRITIN, TIBC, IRON, RETICCTPCT in the last 72 hours.  Coagulation profile  Recent Labs Lab 08/26/17 0204  INR 1.17    No results for input(s): DDIMER in the last 72 hours.  Cardiac Enzymes  Recent Labs Lab 08/25/17 1728  TROPONINI <0.03   ------------------------------------------------------------------------------------------------------------------    Component Value Date/Time   BNP 219.2 (H) 08/25/2017 1728    Inpatient Medications  Scheduled Meds: . chlorhexidine  15 mL Mouth Rinse BID  . feeding supplement  1 Container Oral TID BM  . fluticasone furoate-vilanterol  1 puff Inhalation Daily  . heparin  5,000 Units Subcutaneous Q8H  . mouth rinse  15 mL Mouth Rinse q12n4p  . metoprolol tartrate  25 mg Oral BID  . pantoprazole  40 mg Oral QHS   Continuous Infusions: . sodium chloride    . sodium chloride 100 mL/hr at 08/31/17 0547  . piperacillin-tazobactam (ZOSYN)  IV Stopped (08/31/17 1806)   PRN Meds:.sodium chloride, fentaNYL (SUBLIMAZE) injection, gi cocktail, ipratropium-albuterol, ondansetron (ZOFRAN) IV, phenol  Micro Results Recent Results (from the past 240 hour(s))  Blood culture (routine x 2)     Status: None   Collection Time: 08/25/17  5:25 PM  Result Value Ref Range Status   Specimen Description BLOOD LEFT HAND  Final   Special Requests   Final     IN PEDIATRIC BOTTLE Blood Culture results may not be optimal due to an excessive volume of blood received in culture bottles   Culture   Final    NO GROWTH 5 DAYS Performed at [facility], [place] [address], [place], [place] [address]    Report Status 08/30/2017 FINAL  Final  Blood culture (routine x 2)     Status: None   Collection Time: 08/25/17  5:27 PM  Result Value Ref Range Status   Specimen Description BLOOD LEFT ANTECUBITAL  Final   Special Requests   Final    BOTTLES DRAWN AEROBIC AND ANAEROBIC Blood Culture adequate volume   Culture   Final    NO GROWTH 5 DAYS Performed at [facility], [place] [address], [place], [place] [address]    Report Status 08/30/2017 FINAL  Final  MRSA PCR Screening     Status: None   Collection Time: 08/26/17  1:31 AM  Result Value Ref Range Status   MRSA by PCR NEGATIVE NEGATIVE Final    Comment:        The GeneXpert MRSA Assay (FDA approved for NASAL specimens only), is one component of a comprehensive MRSA colonization surveillance program. It is not intended to diagnose MRSA infection nor to guide or monitor treatment for MRSA infections.   Culture, respiratory (NON-Expectorated)     Status: None   Collection Time: 08/26/17  9:48 AM  Result Value Ref Range Status   Specimen Description TRACHEAL ASPIRATE  Final   Special Requests NONE  Final   Gram Stain   Final    ABUNDANT WBC PRESENT, PREDOMINANTLY PMN MODERATE GRAM POSITIVE RODS MODERATE GRAM POSITIVE COCCI    Culture   Final    Consistent with normal respiratory flora. Performed at [facility], [place] [address] [place],  [address]    Report Status 08/28/2017 FINAL  Final  Body fluid culture (includes gram stain)  Status: None   Collection Time: 08/26/17 10:45 AM  Result Value Ref Range Status   Specimen Description PLEURAL  Final   Special Requests NONE  Final   Gram Stain   Final    FEW WBC PRESENT,BOTH PMN AND MONONUCLEAR NO ORGANISMS SEEN     Culture   Final    NO GROWTH 3 DAYS Performed at [facility], [address], [place], [place] [address]    Report Status 08/29/2017 FINAL  Final    Radiology Reports Dg Chest 2 View  Result Date: 08/29/2017 CLINICAL DATA:  Shortness of breath. History of lung cancer. Pleural effusion. EXAM: CHEST  2 VIEW COMPARISON:  08/27/2017 FINDINGS: The patient has a known large left hilar mass invading the mediastinum and a drowned lung appearance of the left upper lobe on CT scan from 4 days ago. There is also previously a significant left pleural effusion. Volume loss is again observed in the left hemithorax with elevation of left hemidiaphragm. Left pleural effusion again noted. The amount of aerated left lung, while small, is slightly improved from the 08/27/2017 exam. Right-sided aortic arch. Subtle blunting of the right costophrenic angle. IMPRESSION: 1. Minimally improved aeration in the left lung although only a small portion of the lung is aerated. Opacification in much of the left hemithorax due to known mass common drowned lung, and large left pleural effusion. 2. Trace right pleural effusion. 3. Right-sided aortic arch noted. Electronically Signed   By: Van Clines M.D.   On: 08/29/2017 12:17   Ct Angio Chest Pe W Or Wo Contrast  Result Date: 08/25/2017 CLINICAL DATA:  Dyspnea, orthostasis, confusion and tachycardia. Hypoxia with pulse oximeter 67%. Patient canceled radiation therapy today due to progressive weakness. Has not started chemotherapy. EXAM: CT ANGIOGRAPHY CHEST WITH CONTRAST TECHNIQUE: Multidetector CT imaging of the chest was performed using the standard protocol during bolus administration of intravenous contrast. Multiplanar CT image reconstructions and MIPs were obtained to evaluate the vascular anatomy. CONTRAST:  100 cc Isovue 370 IV COMPARISON:  None. FINDINGS: Cardiovascular: Right-sided aortic arch with aberrant left subclavian artery. No acute pulmonary embolus.  Marked narrowing of the left main pulmonary artery secondary to previously described mediastinal soft tissue mass. No aortic aneurysm or dissection. Heart size is normal without pericardial effusion. Mediastinum/Nodes: The previously described soft tissue mass within the superior mediastinal causing mass effect on the left main pulmonary artery, obliterating the left upper lobe bronchus and impinging upon the left upper lobe bronchus is less distinct due to adjacent new left upper lobe atelectasis likely secondary to postobstructive involvement of the left upper lobe bronchus Lungs/Pleura: New moderate left effusion with new left lower lobe compressive atelectasis. Postobstructive collapse of the left upper lobe is more expensive since prior exam with only a scant amount of aerated lingula remaining. The contralateral right lung remains aerated without pneumonic consolidation or effusion. No pneumothorax. Upper Abdomen: No acute abnormality. Musculoskeletal: No chest wall abnormality. No acute or significant osseous findings. Review of the MIP images confirms the above findings. IMPRESSION: 1. New moderate left-sided pleural effusion with left lower lobe compressive atelectasis. Only a scant amount of aerated lung remains within the left hemithorax. 2. Further progression of left upper lobe collapse likely postobstructive in etiology secondary to previously described mediastinal soft tissue mass. Where the margins of this mass ends and the beginning of atelectatic lung begins is difficult to determine due to similar soft tissue attenuation of these abutting abnormalities. This mass is again noted  to obstruct the left upper lobe bronchus, markedly narrowing the left main pulmonary artery and partially narrow the left lower lobe bronchus. 3. Right-sided aortic arch with aberrant left subclavian artery. Electronically Signed   By: Ashley Royalty M.D.   On: 08/25/2017 21:37   Mr Jeri Cos WU Contrast  Result Date:  08/30/2017 CLINICAL DATA:  Metastatic lung cancer.  Progressive dizziness. EXAM: MRI HEAD WITHOUT AND WITH CONTRAST TECHNIQUE: Multiplanar, multiecho pulse sequences of the brain and surrounding structures were obtained without and with intravenous contrast. CONTRAST:  20 cc MultiHance COMPARISON:  07/07/2017 FINDINGS: Brain: Diffusion imaging does not show any acute or subacute infarction. The brainstem and cerebellum are normal except for mild chronic small-vessel change of the pons. 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 No large vessel territory infarction. No mass lesion, hemorrhage, hydrocephalus or extra-axial collection. Incidental venous angioma right frontal lobe. Vascular: Major vessels at the base of the brain show flow. Skull and upper cervical spine: Negative Sinuses/Orbits: Clear/normal Other: None IMPRESSION: No acute finding.  No evidence of metastatic disease. 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 Electronically Signed   By: Nelson Chimes M.D.   On: 08/30/2017 07:28   Dg Chest Port 1 View  Result Date: 08/27/2017 CLINICAL DATA:  Acute respiratory failure with hypoxia. History of left lung cancer. EXAM: PORTABLE CHEST 1 VIEW COMPARISON:  08/26/2017 FINDINGS: Exam demonstrates stable postsurgical change with volume loss of the left lung with elevation of the left hemidiaphragm. There is worsening opacification over the mid to lower left lung likely worsening effusion with atelectasis. Cannot exclude infection in the mid to lower left lung. Right lung is clear. Cardiomediastinal silhouette and remainder of the exam is unchanged. IMPRESSION: Interval worsening of small left pleural effusion likely with associated atelectasis. Cannot exclude infection in the left base. Stable postsurgical change/ volume loss of the left lung.  Electronically Signed   By: Marin Olp M.D.   On: 08/27/2017 07:08   Dg Chest Port 1 View  Result Date: 08/26/2017 CLINICAL DATA:  Status post left side thoracentesis. 500cc removed. Hx of left lung mass. EXAM: PORTABLE CHEST 1 VIEW COMPARISON:  08/25/2017 FINDINGS: There is persistent elevation of left hemidiaphragm. No pneumothorax. Mediastinal mass again identified. The right lung is clear. No pulmonary edema. IMPRESSION: No pneumothorax following thoracentesis. Electronically Signed   By: Nolon Nations M.D.   On: 08/26/2017 11:31   Dg Chest Portable 1 View  Result Date: 08/25/2017 CLINICAL DATA:  Intubation and NG tube placement. EXAM: PORTABLE CHEST 1 VIEW COMPARISON:  Radiographs and chest CT earlier this day. FINDINGS: Endotracheal tube tip 5.2 cm from the carina. Enteric tube in place, and tip below the diaphragm not included in the field of view. Again seen volume loss in the left hemithorax with left pleural effusion. Right paratracheal density corresponds to right aortic arch. Right lung is clear. IMPRESSION: 1. Endotracheal tube tip 5.2 cm from the carina. Enteric tube in place, tip below the diaphragm not included in the field of view. 2. Unchanged left lung volume loss and left pleural effusion. Mediastinal mass evaluated on chest CT earlier this day. Electronically Signed   By: Jeb Levering M.D.   On: 08/25/2017 22:32   Dg Chest Port 1 View  Result Date: 08/25/2017 CLINICAL DATA:  Shortness of breath. History of LEFT lung cancer, RIGHT-sided aortic arch with vascular  loop. EXAM: PORTABLE CHEST 1 VIEW COMPARISON:  PET-CT July 29, 2017 FINDINGS: Elevated LEFT hemidiaphragm, LEFT lung volume loss with perihilar consolidation with air bronchograms. Small LEFT pleural effusion. RIGHT lung is clear. The cardiac silhouette is mildly enlarged, limited assessment due to LEFT process. No pneumothorax. Soft tissue planes and included osseous structures are nonsuspicious, known osseous  metastasis not apparent by plain radiography. IMPRESSION: Worsening LEFT lung atelectasis and consolidation/pneumonia, with underlying known LEFT lung mass. Small LEFT pleural effusion. Electronically Signed   By: Elon Alas M.D.   On: 08/25/2017 19:08    Time Spent in minutes  30   Jani Gravel M.D on 08/31/2017 at 6:05 PM  Between 7am to 7pm - Pager - [phone]  After 7pm go to [email or web address] - password [email or web address]  [facility] -  Office  [phone]

## 2017-08-31 NOTE — Progress Notes (Signed)
Pharmacy Antibiotic Note  Levi Brown is a 61 y.o. male admitted on 08/25/2017 with postobstructive pneumonia.  Pharmacy has been consulted for Zosyn dosing; Vancomycin was discontinued.  Today, 08/31/2017:  Day #5 Zosyn Afebrile WBC slightly increased to 10.9 SCr stable  Plan: Zosyn 3.375g IV Q8H infused over 4hrs. Follow up duration of therapy, renal function, and cultures.  Height: 5\' 5"  (165.1 cm) Weight: 156 lb 8 oz (71 kg) IBW/kg (Calculated) : 61.5  Temp (24hrs), Avg:98.4 F (36.9 C), Min:97.7 F (36.5 C), Max:99 F (37.2 C)   Recent Labs Lab 08/25/17 1744  08/27/17 0148  08/28/17 0621 08/28/17 0915 08/28/17 1053 08/29/17 0658 08/30/17 0732 08/31/17 0644  WBC  --   --  12.3*  --   --   --  12.4* 9.7 10.1 10.9*  CREATININE  --   < > 0.48*  0.45*  < > 0.53*  --  0.54* 0.47* 0.42* 0.42*  LATICACIDVEN 0.97  --   --   --   --   --   --   --   --   --   VANCOTROUGH  --   --   --   --   --  6*  --   --   --   --   < > = values in this interval not displayed.  Estimated Creatinine Clearance: 86.5 mL/min (A) (by C-G formula based on SCr of 0.42 mg/dL (L)).    No Known Allergies  Antimicrobials this admission: 9/12 Roceph/Zithro x 1 9/13 Vanc >> 9/15 9/13 Zosyn >>  Dose adjustments this admission: 9/13 Adjust vancomycin from 750mg  q8h to 1g q12h 9/15 VT = 6 on 1000 mg q12 hr  Microbiology results: 9/12 BCx: NGF 9/13 MRSA PCR: neg 9/13 Trach asp: normal flora 9/13 Pleural fluid: NGF  Thank you for allowing pharmacy to be a part of this patient's care.  Gretta Arab PharmD, BCPS Pager [phone] 08/31/2017 12:50 PM

## 2017-08-31 NOTE — Progress Notes (Signed)
Nutrition Follow-up  DOCUMENTATION CODES:   Not applicable  INTERVENTION:   Boost Breeze po TID, each supplement provides 250 kcal and 9 grams of protein  NUTRITION DIAGNOSIS:   Increased nutrient needs related to catabolic illness, cancer and cancer related treatments as evidenced by estimated needs.  Ongoing  GOAL:   Patient will meet greater than or equal to 90% of their needs  Meeting  MONITOR:   Diet advancement, Weight trends, Labs  REASON FOR ASSESSMENT:   Malnutrition Screening Tool    ASSESSMENT:   61 yr old male with diagnosis of stage IV NSCLC undergoing radiation therapy coming in with progressive shortness of breath for the last 2 weeks. He noticed some swelling of his lower limbs so he was started on lasix by his PCP but did not help. He called for help today and was found to have pulse ox in the 60s. Patient was in the ED taken to CT scan and after he came back he became unresponsive with worsening respiratory status so he was intubated.   9/12-extubated 9/13-thoracentesis drained 550 ml  Spoke with pt at bedside. Pt reports appetite is increasing back to baseline. Meal completions charted as 100% average for last 8 meals. Pt willing to try supplementation this hospital stay for increased protein/calorie needs. Wt noted to be down 2 lb since admission date 9/12. Pt cleared for further radiation. Plans to discharge home. Will continue to encourage PO intake and monitor for supplement acceptance.   Medications reviewed and include: NS @ 100 ml/hr, IV abx Labs reviewed: Na 133 (L) K 3.4 (L) Bl 83 (L) Albumin 2.1 (L)   Diet Order:  Diet regular Room service appropriate? Yes; Fluid consistency: Thin; Fluid restriction: 2000 mL Fluid  Skin:  Reviewed, no issues  Last BM:  08/30/17  Height:   Ht Readings from Last 1 Encounters:  08/26/17 5\' 5"  (1.651 m)    Weight:   Wt Readings from Last 1 Encounters:  08/31/17 156 lb 8 oz (71 kg)    Ideal Body  Weight:  61.82 kg  BMI:  Body mass index is 26.04 kg/m.  Estimated Nutritional Needs:   Kcal:  2250-2475 (30-33 kcal/kg)  Protein:  112-127 grams (1.5-1.7 grams/kg)  Fluid:  >/= 2.2 L/day  EDUCATION NEEDS:   No education needs identified at this time  [place], LDN Clinical Nutrition Pager # - [phone]

## 2017-09-01 ENCOUNTER — Inpatient Hospital Stay (HOSPITAL_COMMUNITY): Payer: Medicaid Other

## 2017-09-01 ENCOUNTER — Ambulatory Visit
Admission: RE | Admit: 2017-09-01 | Discharge: 2017-09-01 | Disposition: A | Discharge status: Home / Self Care | Payer: Medicaid Other | Source: Ambulatory Visit | Attending: Radiation Oncology | Admitting: Radiation Oncology

## 2017-09-01 DIAGNOSIS — E871 Hypo-osmolality and hyponatremia: Secondary | ICD-10-CM

## 2017-09-01 DIAGNOSIS — E222 Syndrome of inappropriate secretion of antidiuretic hormone: Secondary | ICD-10-CM

## 2017-09-01 DIAGNOSIS — E876 Hypokalemia: Secondary | ICD-10-CM

## 2017-09-01 DIAGNOSIS — R609 Edema, unspecified: Secondary | ICD-10-CM

## 2017-09-01 DIAGNOSIS — Z9889 Other specified postprocedural states: Secondary | ICD-10-CM

## 2017-09-01 DIAGNOSIS — J189 Pneumonia, unspecified organism: Secondary | ICD-10-CM

## 2017-09-01 LAB — CBC
HEMATOCRIT: 26.1 % — AB (ref 39.0–52.0)
Hemoglobin: 8 g/dL — ABNORMAL LOW (ref 13.0–17.0)
MCH: 28.6 pg (ref 26.0–34.0)
MCHC: 30.7 g/dL (ref 30.0–36.0)
MCV: 93.2 fL (ref 78.0–100.0)
PLATELETS: 538 10*3/uL — AB (ref 150–400)
RBC: 2.8 MIL/uL — ABNORMAL LOW (ref 4.22–5.81)
RDW: 17.5 % — AB (ref 11.5–15.5)
WBC: 9 10*3/uL (ref 4.0–10.5)

## 2017-09-01 LAB — BASIC METABOLIC PANEL
Anion gap: 8 (ref 5–15)
BUN: <5 mg/dL — ABNORMAL LOW (ref 6–20)
CHLORIDE: 81 mmol/L — AB (ref 101–111)
CO2: 47 mmol/L — AB (ref 22–32)
CREATININE: 0.4 mg/dL — AB (ref 0.61–1.24)
Calcium: 8.8 mg/dL — ABNORMAL LOW (ref 8.9–10.3)
GFR calc non Af Amer: >60 mL/min (ref >60)
Glucose, Bld: 103 mg/dL — ABNORMAL HIGH (ref 65–99)
POTASSIUM: 3.3 mmol/L — AB (ref 3.5–5.1)
Sodium: 136 mmol/L (ref 135–145)

## 2017-09-01 MED ORDER — METOPROLOL TARTRATE 12.5 MG HALF TABLET
12.5000 mg | ORAL_TABLET | Freq: Two times a day (BID) | ORAL | Status: DC
  Administered 2017-09-02 – 2017-09-04 (×4): 12.5 mg via ORAL
  Filled 2017-09-01 (×5): qty 1

## 2017-09-01 MED ORDER — POTASSIUM CHLORIDE CRYS ER 20 MEQ PO TBCR
40.0000 meq | EXTENDED_RELEASE_TABLET | Freq: Once | ORAL | Status: AC
  Administered 2017-09-01: 40 meq via ORAL
  Filled 2017-09-01: qty 2

## 2017-09-01 MED ORDER — FUROSEMIDE 10 MG/ML IJ SOLN
40.0000 mg | Freq: Two times a day (BID) | INTRAMUSCULAR | Status: DC
  Administered 2017-09-01 – 2017-09-04 (×7): 40 mg via INTRAVENOUS
  Filled 2017-09-01 (×7): qty 4

## 2017-09-01 NOTE — Progress Notes (Signed)
VASCULAR LAB PRELIMINARY  PRELIMINARY  PRELIMINARY  PRELIMINARY  Bilateral lower extremity venous duplex completed.    Preliminary report:  Bilateral:  No evidence of DVT, superficial thrombosis, or Baker's Cyst.   Cache Decoursey, RVS 09/01/2017, 3:52 PM

## 2017-09-01 NOTE — Progress Notes (Signed)
PROGRESS NOTE  Levi Brown FXT:[id number] DOB: 12/02/1956 DOA: 08/25/2017 PCP: [name], FNP  HPI/Recap of past 24 hours:  Edematous, on 2liter oxygen ,denies pain  Assessment/Plan: Active Problems:   Non-small cell cancer of left lung (HCC)   Acute respiratory failure with hypoxia and hypercapnia (HCC)   Pleural effusion on left   Collapse of left lung   Hyponatremia   COPD exacerbation (HCC)   Obstructive pneumonia   Hypokalemia   Edema  Large left pleural effusion > exudative, =>atypical cells seen, difficult to tell if due to malignancy  Large left lung mass> squamous cell carcinoma Left lung atelectasis Acute respiratory failure with hypoxemia > improving COPD  Continue duoneb prn Cont Breo Will need pleural drainage catheter (Pleur-X) if cytology is positive per PCCM D/w McQuaid , if patient is stable clinically which he appears to be no pleur-X Cont Zosyn  Fever> post obstructive pneumonia? 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 SCDs  Code Status: DNR  Family Communication: patient   Disposition Plan: not ready for discharge   Consultants:  patient was admitted to icu on 9/13, transferred to hospitalist service on 9/15  Radiation oncology  Procedures:  Intubation/extubation  XRT  Antibiotics: 9/13 vanc >9/15 9/13 zosyn >    Objective: BP 107/75 (BP Location: Left Arm)   Pulse (!) 116   Temp 98.4 F (36.9 C) (Oral)   Resp 20   Ht 5\' 5"  (1.651 m)   Wt 71 kg (156 lb 8 oz)   SpO2 100%   BMI 26.04 kg/m   Intake/Output Summary (Last 24 hours) at 09/01/17 1011 Last data filed at 09/01/17 0100  Gross per 24 hour  Intake             1670 ml  Output             3300 ml  Net            -1630 ml   Filed Weights   08/28/17 0456 08/29/17 0635 08/31/17 0538  Weight: 76.6 kg (168 lb 14.4 oz) 77.9 kg (171 lb 12.8 oz) 71 kg (156 lb 8 oz)    Exam: Patient is examined daily including today on 09/01/2017, exams remain the same as of yesterday except that has changed    General:  NAD  Cardiovascular: RRR  Respiratory: diminished on the left, no wheezing  Abdomen: Soft/ND/NT, positive BS  Musculoskeletal: bilateral lower extremity pitting Edema  Neuro: alert, oriented   Data Reviewed: Basic Metabolic Panel:  Recent Labs Lab 08/28/17 1053 08/29/17 0658 08/30/17 0732 08/31/17 0644 09/01/17 0616  NA 132*  134* 133* 133* 136  K 3.5 2.9* 3.2* 3.4* 3.3*  CL 88* 88* 83* 83* 81*  CO2 37* 39* 44* 44* 47*  GLUCOSE 109* 114* 126* 113* 103*  BUN <5* <5* <5* <5* <5*  CREATININE 0.54* 0.47* 0.42* 0.42* 0.40*  CALCIUM 8.4* 8.3* 8.7* 8.4* 8.8*   Liver Function Tests:  Recent Labs Lab 08/25/17 1728 08/26/17 1346 08/28/17 1053 08/29/17 0658 08/30/17 0732 08/31/17 0644  AST 25  --  25 19 17  14*  ALT 72*  --  54 49 42 37  ALKPHOS 161*  --  113 105 100 100  BILITOT 0.2*  --  0.5 0.3 0.4 0.5  PROT 6.5 6.6 5.3* 5.6* 5.7* 5.5*  ALBUMIN 2.3* 2.2* 2.0* 2.0* 2.1*  2.1*   No results for input(s): LIPASE, AMYLASE in the last 168 hours. No results for input(s): AMMONIA in the last 168 hours. CBC:  Recent Labs Lab 08/25/17 1728 08/27/17 0148 08/28/17 1053 08/29/17 0658 08/30/17 0732 08/31/17 0644 09/01/17 0616  WBC 10.6* 12.3* 12.4* 9.7 10.1 10.9* 9.0  NEUTROABS 9.0* 11.3*  --   --   --   --   --   HGB 8.2* 7.5* 7.7* 8.5* 8.0* 7.7* 8.0*  HCT 25.0* 23.0* 24.3* 26.9* 26.0* 24.6* 26.1*  MCV 86.5 88.8 91.4 92.4 91.5 93.2 93.2  PLT 668* 653* 640* 585* 577* 555* 538*   Cardiac Enzymes:    Recent Labs Lab 08/25/17 1728  TROPONINI <0.03   BNP (last 3 results)  Recent Labs  08/25/17 1728  BNP 219.2*    ProBNP (last 3 results) No results for input(s): PROBNP in the last 8760 hours.  CBG: No results for input(s): GLUCAP in the last 168 hours.  Recent Results (from the past 240 hour(s))  Blood culture (routine x 2)     Status: None   Collection Time: 08/25/17  5:25 PM  Result Value Ref Range Status   Specimen Description BLOOD LEFT HAND  Final   Special Requests   Final    IN PEDIATRIC BOTTLE Blood Culture results may not be optimal due to an excessive volume of blood received in culture bottles   Culture   Final    NO GROWTH 5 DAYS Performed at [facility], [place] [address], [place], [place] [address]    Report Status 08/30/2017 FINAL  Final  Blood culture (routine x 2)     Status: None   Collection Time: 08/25/17  5:27 PM  Result Value Ref Range Status   Specimen Description BLOOD LEFT ANTECUBITAL  Final   Special Requests   Final    BOTTLES DRAWN AEROBIC AND ANAEROBIC Blood Culture adequate volume   Culture   Final    NO GROWTH 5 DAYS Performed at [facility], [place] [address] [place], [place] [address]    Report Status 08/30/2017 FINAL  Final  MRSA PCR Screening     Status: None   Collection Time: 08/26/17  1:31 AM  Result Value Ref Range Status   MRSA by PCR NEGATIVE NEGATIVE Final    Comment:        The  GeneXpert MRSA Assay (FDA approved for NASAL specimens only), is one component of a comprehensive MRSA colonization surveillance program. It is not intended to diagnose MRSA infection nor to guide or monitor treatment for MRSA infections.   Culture, respiratory (NON-Expectorated)     Status: None   Collection Time: 08/26/17  9:48 AM  Result Value Ref Range Status  Specimen Description TRACHEAL ASPIRATE  Final   Special Requests NONE  Final   Gram Stain   Final    ABUNDANT WBC PRESENT, PREDOMINANTLY PMN MODERATE GRAM POSITIVE RODS MODERATE GRAM POSITIVE COCCI    Culture   Final    Consistent with normal respiratory flora. Performed at [facility], [place] [address], [place], [place] [address]    Report Status 08/28/2017 FINAL  Final  Body fluid culture (includes gram stain)     Status: None   Collection Time: 08/26/17 10:45 AM  Result Value Ref Range Status   Specimen Description PLEURAL  Final   Special Requests NONE  Final   Gram Stain   Final    FEW WBC PRESENT,BOTH PMN AND MONONUCLEAR NO ORGANISMS SEEN    Culture   Final    NO GROWTH 3 DAYS Performed at [facility], [place] [address], [place], [place] [address]    Report Status 08/29/2017 FINAL  Final     Studies: No results found.  Scheduled Meds: . chlorhexidine  15 mL Mouth Rinse BID  . feeding supplement  1 Container Oral TID BM  . fluticasone furoate-vilanterol  1 puff Inhalation Daily  . furosemide  40 mg Intravenous BID  . heparin  5,000 Units Subcutaneous Q8H  . mouth rinse  15 mL Mouth Rinse q12n4p  . metoprolol tartrate  25 mg Oral BID  . pantoprazole  40 mg Oral QHS  . potassium chloride  40 mEq Oral Once    Continuous Infusions: . piperacillin-tazobactam (ZOSYN)  IV 3.375 g (09/01/17 0508)     Time spent: 35 mins I have personally reviewed and interpreted on  09/01/2017 daily labs, tele strips, imagings as discussed above under data review session and assessment and plans.  I  reviewed all nursing notes, pharmacy notes, consultant notes,  vitals, pertinent old records  I have discussed plan of care as described above with RN , patient on 09/01/2017   Betty Daidone MD, PhD  [facility] Pager [phone]. If 7PM-7AM, please contact night-coverage at [email or web address], password [facility] 09/01/2017, 10:11 AM  LOS: 6 days

## 2017-09-02 ENCOUNTER — Ambulatory Visit
Admission: RE | Admit: 2017-09-02 | Discharge: 2017-09-02 | Disposition: A | Discharge status: Home / Self Care | Payer: Medicaid Other | Source: Ambulatory Visit | Attending: Radiation Oncology | Admitting: Radiation Oncology

## 2017-09-02 LAB — CBC WITH DIFFERENTIAL/PLATELET
BASOS PCT: 0 %
Basophils Absolute: 0 10*3/uL (ref 0.0–0.1)
EOS ABS: 0.3 10*3/uL (ref 0.0–0.7)
EOS PCT: 3 %
HEMATOCRIT: 24.1 % — AB (ref 39.0–52.0)
HEMOGLOBIN: 7.4 g/dL — AB (ref 13.0–17.0)
LYMPHS PCT: 6 %
Lymphs Abs: 0.5 10*3/uL — ABNORMAL LOW (ref 0.7–4.0)
MCH: 28 pg (ref 26.0–34.0)
MCHC: 30.7 g/dL (ref 30.0–36.0)
MCV: 91.3 fL (ref 78.0–100.0)
MONOS PCT: 8 %
Monocytes Absolute: 0.7 10*3/uL (ref 0.1–1.0)
NEUTROS ABS: 6.8 10*3/uL (ref 1.7–7.7)
NEUTROS PCT: 83 %
Platelets: 490 10*3/uL — ABNORMAL HIGH (ref 150–400)
RBC: 2.64 MIL/uL — ABNORMAL LOW (ref 4.22–5.81)
RDW: 17.8 % — ABNORMAL HIGH (ref 11.5–15.5)
WBC: 8.3 10*3/uL (ref 4.0–10.5)

## 2017-09-02 LAB — BASIC METABOLIC PANEL
ANION GAP: 10 (ref 5–15)
CHLORIDE: 79 mmol/L — AB (ref 101–111)
CO2: 49 mmol/L — AB (ref 22–32)
Calcium: 8.9 mg/dL (ref 8.9–10.3)
Creatinine, Ser: 0.47 mg/dL — ABNORMAL LOW (ref 0.61–1.24)
GFR calc Af Amer: >60 mL/min (ref >60)
GFR calc non Af Amer: >60 mL/min (ref >60)
GLUCOSE: 111 mg/dL — AB (ref 65–99)
POTASSIUM: 3.4 mmol/L — AB (ref 3.5–5.1)
Sodium: 138 mmol/L (ref 135–145)

## 2017-09-02 LAB — T4, FREE: FREE T4: 0.79 ng/dL (ref 0.61–1.12)

## 2017-09-02 LAB — MAGNESIUM: Magnesium: 1.6 mg/dL — ABNORMAL LOW (ref 1.7–2.4)

## 2017-09-02 MED ORDER — MAGNESIUM SULFATE 2 GM/50ML IV SOLN
2.0000 g | Freq: Once | INTRAVENOUS | Status: AC
  Administered 2017-09-02: 2 g via INTRAVENOUS
  Filled 2017-09-02: qty 50

## 2017-09-02 MED ORDER — AMOXICILLIN-POT CLAVULANATE 875-125 MG PO TABS
1.0000 | ORAL_TABLET | Freq: Two times a day (BID) | ORAL | Status: DC
  Administered 2017-09-02 – 2017-09-04 (×5): 1 via ORAL
  Filled 2017-09-02 (×5): qty 1

## 2017-09-02 NOTE — Care Management Note (Signed)
Case Management Note  Patient Details  Name: Levi Brown MRN: [id number] Date of Birth: Feb 23, 1956  Subjective/Objective:                  o2 at 2l/[place] Large left pleural effusion > exudative, =>atypical cells seen, difficult to tell if due to malignancy  Large left lung mass> squamous cell carcinoma Left lung atelectasis Acute respiratory failure with hypoxemia > improving COPD  Continue duoneb prn Cont Breo Will need pleural drainage catheter (Pleur-X) if cytology is positive per PCCM D/w McQuaid , if patient is stable clinically which he appears to be no pleur-X Cont Zosyn Action/Plan: Date:  September 02, 2017 Chart reviewed for concurrent status and case management needs.  Will continue to follow patient progress.  Discharge Planning: following for needs  Expected discharge date: [id number]  Velva Harman, BSN, [place], [place]   Expected Discharge Date:   (UNKNOWN)               Expected Discharge Plan:  Home/Self Care  In-House Referral:     Discharge planning Services  CM Consult  Post Acute Care Choice:    Choice offered to:     DME Arranged:    DME Agency:     HH Arranged:    HH Agency:     Status of Service:  In process, will continue to follow  If discussed at Long Length of Stay Meetings, dates discussed:    Additional Comments:  Leeroy Cha, RN 09/02/2017, 9:51 AM

## 2017-09-02 NOTE — Progress Notes (Addendum)
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 possible Blood culture no growth, trach aspirate with normal respiratory flora.  DC vanco 9/15, d/c zosyn on 9/20, change to augmentin for another two days.  Edematous/volume overload Echo lvef 55-60%,  Venous doppler bilateral lower extremity no DVT Started iv lasix bid on 9/19, continue for another day, likely will need discharge on oral lasix for a few more days  Dizziness MRI brain w and w/o contrast => negativemetastatic disease  Sinus tachycardia , CTA chest 9/12=> negative for PE  tsh slightly elevated, free t4 wnl. 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 SCDs  Code Status: DNR  Family Communication: patient and sister  Disposition Plan:  Discharge in 1-2 days will need home o2   Consultants:  patient was admitted to icu on 9/13, transferred to hospitalist service on 9/15  Radiation oncology  Procedures:  Intubation/extubation  XRT  Antibiotics: 9/13 vanc >9/15 9/13 zosyn >    Objective: BP 98/70   Pulse (!) 110   Temp 98.5 F (36.9 C) (Oral)   Resp 16   Ht 5\' 5"  (1.651 m)   Wt 75.3 kg (166 lb)   SpO2 95%   BMI 27.62 kg/m   Intake/Output Summary (Last 24 hours) at 09/02/17 1214 Last data filed at 09/02/17 1130  Gross per 24 hour  Intake              820 ml  Output             3350 ml  Net            -2530 ml   Filed Weights   08/29/17 0635 08/31/17 0538 09/02/17 0521  Weight: 77.9 kg (171 lb 12.8 oz) 71 kg (156 lb 8 oz) 75.3 kg (166 lb)    Exam: Patient is  examined daily including today on 09/02/2017, exams remain the same as of yesterday except that has changed    General:  NAD  Cardiovascular: RRR  Respiratory: diminished on the left, no wheezing  Abdomen: Soft/ND/NT, positive BS  Musculoskeletal: bilateral lower extremity pitting Edema  Neuro: alert, oriented   Data Reviewed: Basic Metabolic Panel:  Recent Labs Lab 08/29/17 0658 08/30/17 0732 08/31/17 0644 09/01/17 0616 09/02/17 0642  NA 134* 133* 133* 136 138  K 2.9* 3.2* 3.4* 3.3* 3.4*  CL 88* 83* 83* 81* 79*  CO2 39* 44* 44* 47* 49*  GLUCOSE 114* 126* 113* 103*  111*  BUN <5* <5* <5* <5* <5*  CREATININE 0.47* 0.42* 0.42* 0.40* 0.47*  CALCIUM 8.3* 8.7* 8.4* 8.8* 8.9  MG  --   --   --   --  1.6*   Liver Function Tests:  Recent Labs Lab 08/26/17 1346 08/28/17 1053 08/29/17 0658 08/30/17 0732 08/31/17 0644  AST  --  25 19 17  14*  ALT  --  54 49 42 37  ALKPHOS  --  113 105 100 100  BILITOT  --  0.5 0.3 0.4 0.5  PROT 6.6 5.3* 5.6* 5.7* 5.5*  ALBUMIN 2.2* 2.0* 2.0* 2.1* 2.1*   No results for input(s): LIPASE, AMYLASE in the last 168 hours. No results for input(s): AMMONIA in the last 168 hours. CBC:  Recent Labs Lab 08/27/17 0148  08/29/17 8366 08/30/17 0732 08/31/17 0644 09/01/17 0616 09/02/17 0642  WBC 12.3*  < > 9.7 10.1 10.9* 9.0 8.3  NEUTROABS 11.3*  --   --   --   --   --  6.8  HGB 7.5*  < > 8.5* 8.0* 7.7* 8.0* 7.4*  HCT 23.0*  < > 26.9* 26.0* 24.6* 26.1* 24.1*  MCV 88.8  < > 92.4 91.5 93.2 93.2 91.3  PLT 653*  < > 585* 577* 555* 538* 490*  < > = values in this interval not displayed. Cardiac Enzymes:   No results for input(s): CKTOTAL, CKMB, CKMBINDEX, TROPONINI in the last 168 hours. BNP (last 3 results)  Recent Labs  08/25/17 1728  BNP 219.2*    ProBNP (last 3 results) No results for input(s): PROBNP in the last 8760 hours.  CBG: No results for input(s): GLUCAP in the last 168 hours.  Recent Results (from the past 240 hour(s))  Blood culture (routine x 2)     Status: None   Collection Time: 08/25/17  5:25 PM  Result Value Ref Range Status   Specimen Description BLOOD LEFT HAND  Final   Special Requests   Final    IN PEDIATRIC BOTTLE Blood Culture results may not be optimal due to an excessive volume of blood received in culture bottles   Culture   Final    NO GROWTH 5 DAYS Performed at [facility], [place] [address] [place], [place] [address]    Report Status 08/30/2017 FINAL  Final  Blood culture (routine x 2)     Status: None   Collection Time: 08/25/17  5:27 PM  Result Value Ref Range Status    Specimen Description BLOOD LEFT ANTECUBITAL  Final   Special Requests   Final    BOTTLES DRAWN AEROBIC AND ANAEROBIC Blood Culture adequate volume   Culture   Final    NO GROWTH 5 DAYS Performed at [facility], [place] [address] [place], [place] [address]    Report Status 08/30/2017 FINAL  Final  MRSA PCR Screening  Status: None   Collection Time: 08/26/17  1:31 AM  Result Value Ref Range Status   MRSA by PCR NEGATIVE NEGATIVE Final    Comment:        The GeneXpert MRSA Assay (FDA approved for NASAL specimens only), is one component of a comprehensive MRSA colonization surveillance program. It is not intended to diagnose MRSA infection nor to guide or monitor treatment for MRSA infections.   Culture, respiratory (NON-Expectorated)     Status: None   Collection Time: 08/26/17  9:48 AM  Result Value Ref Range Status   Specimen Description TRACHEAL ASPIRATE  Final   Special Requests NONE  Final   Gram Stain   Final    ABUNDANT WBC PRESENT, PREDOMINANTLY PMN MODERATE GRAM POSITIVE RODS MODERATE GRAM POSITIVE COCCI    Culture   Final    Consistent with normal respiratory flora. Performed at [facility], [place] [address], [place], [place] [address]    Report Status 08/28/2017 FINAL  Final  Body fluid culture (includes gram stain)     Status: None   Collection Time: 08/26/17 10:45 AM  Result Value Ref Range Status   Specimen Description PLEURAL  Final   Special Requests NONE  Final   Gram Stain   Final    FEW WBC PRESENT,BOTH PMN AND MONONUCLEAR NO ORGANISMS SEEN    Culture   Final    NO GROWTH 3 DAYS Performed at [facility], [place] [address], [place], [place] [address]    Report Status 08/29/2017 FINAL  Final     Studies: Dg Chest 2 View  Result Date: 09/01/2017 CLINICAL DATA:  Pleural effusion EXAM: CHEST  2 VIEW COMPARISON:  08/29/2017 FINDINGS: Moderate to large left pleural effusion is similar. There is associated left central lung  collapse/consolidation. Tiny right pleural effusion again noted. Right-sided aortic arch again noted. The visualized bony structures of the thorax are intact. Telemetry leads overlie the chest. IMPRESSION: 1. No substantial interval change. Moderate to large left pleural effusion with left lung collapse/ consolidation. 2. Tiny right pleural effusion 3. Right aortic arch. Electronically Signed   By: Misty Stanley M.D.   On: 09/01/2017 12:39    Scheduled Meds: . chlorhexidine  15 mL Mouth Rinse BID  . feeding supplement  1 Container Oral TID BM  . fluticasone furoate-vilanterol  1 puff Inhalation Daily  . furosemide  40 mg Intravenous BID  . heparin  5,000 Units Subcutaneous Q8H  . mouth rinse  15 mL Mouth Rinse q12n4p  . metoprolol tartrate  12.5 mg Oral BID  . pantoprazole  40 mg Oral QHS    Continuous Infusions: . magnesium sulfate 1 - 4 g bolus IVPB    . piperacillin-tazobactam (ZOSYN)  IV Stopped (09/02/17 6734)     Time spent: 25 mins I have personally reviewed and interpreted on  09/02/2017 daily labs, tele strips, imagings as discussed above under data review session and assessment and plans.  I reviewed all nursing notes, pharmacy notes, consultant notes,  vitals, pertinent old records  I have discussed plan of care as described above with RN , patient on 09/02/2017   Keydi Giel MD, PhD  [facility] Pager [phone]. If 7PM-7AM, please contact night-coverage at [email or web address], password [facility] 09/02/2017, 12:14 PM  LOS: 7 days

## 2017-09-03 ENCOUNTER — Ambulatory Visit
Admission: RE | Admit: 2017-09-03 | Discharge: 2017-09-03 | Disposition: A | Discharge status: Home / Self Care | Payer: Medicaid Other | Source: Ambulatory Visit | Attending: Radiation Oncology | Admitting: Radiation Oncology

## 2017-09-03 ENCOUNTER — Inpatient Hospital Stay (HOSPITAL_COMMUNITY): Payer: Medicaid Other

## 2017-09-03 DIAGNOSIS — R600 Localized edema: Secondary | ICD-10-CM

## 2017-09-03 LAB — CBC WITH DIFFERENTIAL/PLATELET
BASOS ABS: 0 10*3/uL (ref 0.0–0.1)
Basophils Relative: 0 %
EOS ABS: 0.3 10*3/uL (ref 0.0–0.7)
Eosinophils Relative: 4 %
HCT: 24.7 % — ABNORMAL LOW (ref 39.0–52.0)
Hemoglobin: 7.6 g/dL — ABNORMAL LOW (ref 13.0–17.0)
LYMPHS ABS: 0.6 10*3/uL — AB (ref 0.7–4.0)
Lymphocytes Relative: 9 %
MCH: 27.9 pg (ref 26.0–34.0)
MCHC: 30.8 g/dL (ref 30.0–36.0)
MCV: 90.8 fL (ref 78.0–100.0)
MONOS PCT: 4 %
Monocytes Absolute: 0.3 10*3/uL (ref 0.1–1.0)
NEUTROS ABS: 5.8 10*3/uL (ref 1.7–7.7)
Neutrophils Relative %: 83 %
PLATELETS: 482 10*3/uL — AB (ref 150–400)
RBC: 2.72 MIL/uL — ABNORMAL LOW (ref 4.22–5.81)
RDW: 17.7 % — AB (ref 11.5–15.5)
WBC: 7 10*3/uL (ref 4.0–10.5)

## 2017-09-03 LAB — BASIC METABOLIC PANEL
ANION GAP: 8 (ref 5–15)
CALCIUM: 8.8 mg/dL — AB (ref 8.9–10.3)
CO2: 50 mmol/L — ABNORMAL HIGH (ref 22–32)
CREATININE: 0.46 mg/dL — AB (ref 0.61–1.24)
Chloride: 76 mmol/L — ABNORMAL LOW (ref 101–111)
GFR calc Af Amer: >60 mL/min (ref >60)
GLUCOSE: 106 mg/dL — AB (ref 65–99)
Potassium: 3.2 mmol/L — ABNORMAL LOW (ref 3.5–5.1)
Sodium: 134 mmol/L — ABNORMAL LOW (ref 135–145)

## 2017-09-03 LAB — T3, FREE: T3 FREE: 1.9 pg/mL — AB (ref 2.0–4.4)

## 2017-09-03 LAB — MAGNESIUM: Magnesium: 1.9 mg/dL (ref 1.7–2.4)

## 2017-09-03 MED ORDER — IOPAMIDOL (ISOVUE-370) INJECTION 76%
INTRAVENOUS | Status: AC
  Filled 2017-09-03: qty 100

## 2017-09-03 MED ORDER — SENNA 8.6 MG PO TABS
1.0000 | ORAL_TABLET | Freq: Every day | ORAL | Status: DC
  Administered 2017-09-03: 8.6 mg via ORAL
  Filled 2017-09-03: qty 1

## 2017-09-03 MED ORDER — POTASSIUM CHLORIDE CRYS ER 20 MEQ PO TBCR
40.0000 meq | EXTENDED_RELEASE_TABLET | ORAL | Status: AC
  Administered 2017-09-03 (×2): 40 meq via ORAL
  Filled 2017-09-03 (×2): qty 2

## 2017-09-03 MED ORDER — MAGNESIUM SULFATE 2 GM/50ML IV SOLN: 2.0000 g | Freq: Once | INTRAVENOUS | Status: DC

## 2017-09-03 MED ORDER — IOPAMIDOL (ISOVUE-370) INJECTION 76%: 100.0000 mL | Freq: Once | INTRAVENOUS | Status: DC | PRN

## 2017-09-03 MED ORDER — LIDOCAINE HCL 2 % IJ SOLN
INTRAMUSCULAR | Status: AC
  Filled 2017-09-03: qty 10

## 2017-09-03 MED ORDER — TRAZODONE HCL 50 MG PO TABS
50.0000 mg | ORAL_TABLET | Freq: Every day | ORAL | Status: DC
  Administered 2017-09-03: 50 mg via ORAL
  Filled 2017-09-03: qty 1

## 2017-09-03 NOTE — Evaluation (Signed)
Physical Therapy Evaluation Patient Details Name: Levi Brown MRN: [id number] DOB: 04/17/1956 Today's Date: 09/03/2017   History of Present Illness  61 yr old male with diagnosis of stage IV NSCLC undergoing radiation therapy coming in with progressive shortness of breath and admitted for acute respiratory failure  Clinical Impression  Pt admitted with above diagnosis. Pt currently with functional limitations due to the deficits listed below (see PT Problem List).  Pt will benefit from skilled PT to increase their independence and safety with mobility to allow discharge to the venue listed below.  Pt ambulated 40 feet x2 with seated rest break. Pt requires supplemental oxygen at this time ( please see saturations progress note).     Follow Up Recommendations No PT follow up    Equipment Recommendations  None recommended by PT (qualifies for home oxygen)    Recommendations for Other Services       Precautions / Restrictions Precautions Precautions: Fall Precaution Comments: monitor sats      Mobility  Bed Mobility               General bed mobility comments: pt up in recliner on arrival  Transfers Overall transfer level: Modified independent                  Ambulation/Gait Ambulation/Gait assistance: Supervision Ambulation Distance (Feet): 40 Feet Assistive device: None Gait Pattern/deviations: Step-through pattern     General Gait Details: slow but steady gait, SpO2 dropped on room air so 2L O2 [place] applied and pt required seated rest break (40 feet x2) (please see saturations progress note)  Stairs            Wheelchair Mobility    Modified Rankin (Stroke Patients Only)       Balance                                             Pertinent Vitals/Pain Pain Assessment: No/denies pain    Home Living Family/patient expects to be discharged to:: Private residence Living Arrangements: Other relatives     Home Access:  Level entry     Home Layout: One level Home Equipment: None      Prior Function Level of Independence: Independent               Hand Dominance        Extremity/Trunk Assessment        Lower Extremity Assessment Lower Extremity Assessment: Overall WFL for tasks assessed       Communication   Communication: No difficulties  Cognition Arousal/Alertness: Awake/alert Behavior During Therapy: Flat affect Overall Cognitive Status: Within Functional Limits for tasks assessed                                        General Comments      Exercises     Assessment/Plan    PT Assessment Patient needs continued PT services  PT Problem List Cardiopulmonary status limiting activity;Decreased activity tolerance;Decreased mobility       PT Treatment Interventions DME instruction;Gait training;Functional mobility training;Therapeutic exercise;Patient/family education;Therapeutic activities    PT Goals (Current goals can be found in the Care Plan section)  Acute Rehab PT Goals PT Goal Formulation: With patient Time For Goal Achievement: 09/10/17 Potential to Achieve Goals:  Good    Frequency Min 3X/week   Barriers to discharge        Co-evaluation               AM-PAC PT "6 Clicks" Daily Activity  Outcome Measure Difficulty turning over in bed (including adjusting bedclothes, sheets and blankets)?: None Difficulty moving from lying on back to sitting on the side of the bed? : None Difficulty sitting down on and standing up from a chair with arms (e.g., wheelchair, bedside commode, etc,.)?: None Help needed moving to and from a bed to chair (including a wheelchair)?: None Help needed walking in hospital room?: A Little Help needed climbing 3-5 steps with a railing? : A Little 6 Click Score: 22    End of Session Equipment Utilized During Treatment: Oxygen Activity Tolerance: Patient limited by fatigue Patient left: in chair;with call  bell/phone within reach;with nursing/sitter in room Nurse Communication: Mobility status PT Visit Diagnosis: Difficulty in walking, not elsewhere classified (R26.2)    Time: 1040-1053 PT Time Calculation (min) (ACUTE ONLY): 13 min   Charges:   PT Evaluation $PT Eval Low Complexity: 1 Low     PT G CodesCarmelia Bake, PT, DPT 09/03/2017 Pager: [phone]  York Ram E 09/03/2017, 12:11 PM

## 2017-09-03 NOTE — Progress Notes (Signed)
SATURATION QUALIFICATIONS: (This note is used to comply with regulatory documentation for home oxygen)  Patient Saturations on Room Air at Rest = 95%  Patient Saturations on Room Air while Ambulating = 73%  Patient Saturations on 2 Liters of oxygen while Ambulating = 89%  Please briefly explain why patient needs home oxygen: to improve oxygen saturations above 88% during physical activity such as ambulation  Carmelia Bake, PT, DPT 09/03/2017 Pager: [phone]

## 2017-09-03 NOTE — Progress Notes (Addendum)
PROGRESS NOTE  Levi Brown XBJ:[id number] DOB: 1956/11/25 DOA: 08/25/2017 PCP: [name], FNP  HPI/Recap of past 24 hours:  Less Edematous, but report feeling more sob, not able to lay flat at night, on 2liter oxygen ,denies pain  Assessment/Plan: Active Problems:   Non-small cell cancer of left lung (HCC)   Acute respiratory failure with hypoxia and hypercapnia (HCC)   Pleural effusion on left   Collapse of left lung   Hyponatremia   COPD exacerbation (HCC)   Obstructive pneumonia   Hypokalemia   Edema  Acute respiratory failure with hypoxemia, required intubation and icu admission, extubated, now DNR, on o2 supplement.  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, Left lung atelectasis  COPD (no wheezing)  Continue duoneb prn Cont Breo Pulmonology initially think patient might need pleural drainage catheter (Pleur-X) if cytology is positive per PCCM My hospitalist colleague Dr Jani Gravel D/w pulmonology Dr [place] Bells , per discussion" if patient is stable clinically which he appears to be no pleur-X" D/c Zosyn, change to augmentin for another two days  Repeat thoracentesis (last on 9/13) since patient is feeling more sob, may eventually need pleurx placement Per IR, not enough fluids to tap, since patient is more sob and has persistent sinus tachycardia, will get CTA chest  Sinus tachycardia  CTA chest 9/12=> negative for PE  tsh slightly elevated, free t4 wnl. Cardiac echo=> 9/17=> EF 55-60% Continue betablocker Repeat CTA on 9/21  Fever 101 on admission > post obstructive pneumonia? possible -Blood culture no growth, trach aspirate with normal respiratory flora.  -DC vanco 9/15, d/c zosyn on 9/20, change to augmentin for another two days.  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 Sodium 117 on admission Free water restrict Na normalized  Hypokalemia/hypomagnesemia k remain low, continue to Replete k repeat k/mag in am  Anemia without bleeding Monitor for bleeding Transfusion goal is Hgb < 7gm/dL Check cbc in am    STUDIES: 08/26/2017 CT angiogram chest> no pulmonary embolism, large left sided effusion and left sided mass both causing compressive atelectasis of the left lung, no PE  CULTURES: 9/12 blood > 9/13 resp culture >moderate GPC and GPR 9/14 pleural > gram stain negative   LINES/TUBES: 9/13 ETT >9/14    DVT Prophylaxis: Heparin, SCDs  Code Status: DNR  Family Communication: patient and sister  Disposition Plan:  Discharge in 1-2 days will need home o2   Consultants:  patient was admitted to icu on 9/13, transferred to hospitalist service on 9/15  Radiation oncology  Procedures:  Intubation/extubation  XRT  Antibiotics: 9/13 vanc >9/15 9/13 zosyn >9/20 augmentin from 9/20    Objective: BP 91/66 (BP Location: Left Arm)   Pulse (!) 106   Temp 98.5 F (36.9 C) (Oral)   Resp 18   Ht 5\' 5"  (1.651 m)   Wt 75.3 kg (166 lb)   SpO2 100%   BMI 27.62 kg/m   Intake/Output Summary (Last 24 hours) at 09/03/17 1036 Last data filed at 09/03/17 0600  Gross per 24 hour  Intake  50 ml  Output             2850 ml  Net            -2800 ml   Filed Weights   08/29/17 0635 08/31/17 0538 09/02/17 0521  Weight: 77.9 kg (171 lb 12.8 oz) 71 kg (156 lb 8 oz) 75.3 kg (166 lb)    Exam: Patient is examined daily including today on 09/03/2017, exams remain the same as of yesterday except that has changed    General:  NAD  Cardiovascular: sinus  tachycardia  Respiratory: diminished on the left, no wheezing  Abdomen: Soft/ND/NT, positive BS  Musculoskeletal: bilateral lower extremity pitting Edema  Neuro: alert, oriented   Data Reviewed: Basic Metabolic Panel:  Recent Labs Lab 08/30/17 0732 08/31/17 0644 09/01/17 0616 09/02/17 0642 09/03/17 0608  NA 133* 133* 136 138 134*  K 3.2* 3.4* 3.3* 3.4* 3.2*  CL 83* 83* 81* 79* 76*  CO2 44* 44* 47* 49* 50*  GLUCOSE 126* 113* 103* 111* 106*  BUN <5* <5* <5* <5* <5*  CREATININE 0.42* 0.42* 0.40* 0.47* 0.46*  CALCIUM 8.7* 8.4* 8.8* 8.9 8.8*  MG  --   --   --  1.6* 1.9   Liver Function Tests:  Recent Labs Lab 08/28/17 1053 08/29/17 0658 08/30/17 0732 08/31/17 0644  AST 25 19 17  14*  ALT 54 49 42 37  ALKPHOS 113 105 100 100  BILITOT 0.5 0.3 0.4 0.5  PROT 5.3* 5.6* 5.7* 5.5*  ALBUMIN 2.0* 2.0* 2.1* 2.1*   No results for input(s): LIPASE, AMYLASE in the last 168 hours. No results for input(s): AMMONIA in the last 168 hours. CBC:  Recent Labs Lab 08/30/17 0732 08/31/17 0644 09/01/17 0616 09/02/17 0642 09/03/17 0608  WBC 10.1 10.9* 9.0 8.3 7.0  NEUTROABS  --   --   --  6.8 5.8  HGB 8.0* 7.7* 8.0* 7.4* 7.6*  HCT 26.0* 24.6* 26.1* 24.1* 24.7*  MCV 91.5 93.2 93.2 91.3 90.8  PLT 577* 555* 538* 490* 482*   Cardiac Enzymes:   No results for input(s): CKTOTAL, CKMB, CKMBINDEX, TROPONINI in the last 168 hours. BNP (last 3 results)  Recent Labs  08/25/17 1728  BNP 219.2*    ProBNP (last 3 results) No results for input(s): PROBNP in the last 8760 hours.  CBG: No results for input(s): GLUCAP in the last 168 hours.  Recent Results (from the past 240 hour(s))  Blood culture (routine x 2)     Status: None   Collection Time: 08/25/17  5:25 PM  Result Value Ref Range Status   Specimen Description BLOOD LEFT HAND  Final   Special Requests   Final    IN PEDIATRIC BOTTLE Blood Culture results may not be optimal due to an excessive volume of blood received in  culture bottles   Culture   Final    NO GROWTH 5 DAYS Performed at [facility], [place] [address] [place], [place] [address]    Report Status 08/30/2017 FINAL  Final  Blood culture (routine x 2)     Status: None   Collection Time: 08/25/17  5:27 PM  Result Value Ref Range Status   Specimen Description BLOOD LEFT ANTECUBITAL  Final   Special Requests   Final    BOTTLES DRAWN AEROBIC AND ANAEROBIC Blood Culture adequate volume   Culture   Final    NO GROWTH 5 DAYS Performed at [facility], [place] [address], [place], [place]  [address]    Report Status 08/30/2017 FINAL  Final  MRSA PCR Screening     Status: None   Collection Time: 08/26/17  1:31 AM  Result Value Ref Range Status   MRSA by PCR NEGATIVE NEGATIVE Final    Comment:        The GeneXpert MRSA Assay (FDA approved for NASAL specimens only), is one component of a comprehensive MRSA colonization surveillance program. It is not intended to diagnose MRSA infection nor to guide or monitor treatment for MRSA infections.   Culture, respiratory (NON-Expectorated)     Status: None   Collection Time: 08/26/17  9:48 AM  Result Value Ref Range Status   Specimen Description TRACHEAL ASPIRATE  Final   Special Requests NONE  Final   Gram Stain   Final    ABUNDANT WBC PRESENT, PREDOMINANTLY PMN MODERATE GRAM POSITIVE RODS MODERATE GRAM POSITIVE COCCI    Culture   Final    Consistent with normal respiratory flora. Performed at [facility], [place] [address] [place], [place] [address]    Report Status 08/28/2017 FINAL  Final  Body fluid culture (includes gram stain)     Status: None   Collection Time: 08/26/17 10:45 AM  Result Value Ref Range Status   Specimen Description PLEURAL  Final   Special Requests NONE  Final   Gram Stain   Final    FEW WBC PRESENT,BOTH PMN AND MONONUCLEAR NO ORGANISMS SEEN    Culture   Final    NO GROWTH 3 DAYS Performed at [facility], [place] [address], [place], [place]  [address]    Report Status 08/29/2017 FINAL  Final     Studies: No results found.  Scheduled Meds: . amoxicillin-clavulanate  1 tablet Oral Q12H  . chlorhexidine  15 mL Mouth Rinse BID  . feeding supplement  1 Container Oral TID BM  . fluticasone furoate-vilanterol  1 puff Inhalation Daily  . furosemide  40 mg Intravenous BID  . mouth rinse  15 mL Mouth Rinse q12n4p  . metoprolol tartrate  12.5 mg Oral BID  . pantoprazole  40 mg Oral QHS    Continuous Infusions:    Time spent: 35 mins I have personally reviewed and interpreted on  09/03/2017 daily labs, tele strips, imagings as discussed above under data review session and assessment and plans.  I reviewed all nursing notes, pharmacy notes, consultant notes,  vitals, pertinent old records  I have discussed plan of care as described above with RN , patient on 09/03/2017   Case discussed with IR, pccm , sister updated.   Jasneet Schobert MD, PhD  [facility] Pager [phone]. If 7PM-7AM, please contact night-coverage at [email or web address], password [facility] 09/03/2017, 10:36 AM  LOS: 8 days

## 2017-09-04 ENCOUNTER — Inpatient Hospital Stay (HOSPITAL_COMMUNITY): Payer: Medicaid Other

## 2017-09-04 LAB — CBC WITH DIFFERENTIAL/PLATELET
BASOS ABS: 0.1 10*3/uL (ref 0.0–0.1)
Basophils Relative: 1 %
EOS ABS: 0.3 10*3/uL (ref 0.0–0.7)
Eosinophils Relative: 3 %
HCT: 26.7 % — ABNORMAL LOW (ref 39.0–52.0)
Hemoglobin: 8.4 g/dL — ABNORMAL LOW (ref 13.0–17.0)
LYMPHS ABS: 0.6 10*3/uL — AB (ref 0.7–4.0)
Lymphocytes Relative: 6 %
MCH: 28.3 pg (ref 26.0–34.0)
MCHC: 31.5 g/dL (ref 30.0–36.0)
MCV: 89.9 fL (ref 78.0–100.0)
MONO ABS: 0.5 10*3/uL (ref 0.1–1.0)
MONOS PCT: 5 %
Neutro Abs: 8.6 10*3/uL — ABNORMAL HIGH (ref 1.7–7.7)
Neutrophils Relative %: 85 %
Platelets: 533 10*3/uL — ABNORMAL HIGH (ref 150–400)
RBC: 2.97 MIL/uL — AB (ref 4.22–5.81)
RDW: 18 % — AB (ref 11.5–15.5)
WBC: 10.1 10*3/uL (ref 4.0–10.5)

## 2017-09-04 LAB — BASIC METABOLIC PANEL
ANION GAP: 12 (ref 5–15)
BUN: <5 mg/dL — ABNORMAL LOW (ref 6–20)
CALCIUM: 9.2 mg/dL (ref 8.9–10.3)
CO2: 43 mmol/L — ABNORMAL HIGH (ref 22–32)
Chloride: 79 mmol/L — ABNORMAL LOW (ref 101–111)
Creatinine, Ser: 0.44 mg/dL — ABNORMAL LOW (ref 0.61–1.24)
Glucose, Bld: 118 mg/dL — ABNORMAL HIGH (ref 65–99)
Potassium: 3.6 mmol/L (ref 3.5–5.1)
SODIUM: 134 mmol/L — AB (ref 135–145)

## 2017-09-04 LAB — MAGNESIUM: Magnesium: 1.8 mg/dL (ref 1.7–2.4)

## 2017-09-04 MED ORDER — BOOST / RESOURCE BREEZE PO LIQD
1.0000 | Freq: Three times a day (TID) | ORAL | 0 refills | Status: AC
Start: 2017-09-04 — End: ?

## 2017-09-04 MED ORDER — POTASSIUM CHLORIDE CRYS ER 20 MEQ PO TBCR
40.0000 meq | EXTENDED_RELEASE_TABLET | Freq: Once | ORAL | Status: AC
  Administered 2017-09-04: 40 meq via ORAL
  Filled 2017-09-04: qty 2

## 2017-09-04 MED ORDER — FUROSEMIDE 40 MG PO TABS: 40.0000 mg | ORAL_TABLET | Freq: Every day | ORAL | 0 refills | Status: DC

## 2017-09-04 MED ORDER — METOPROLOL TARTRATE 25 MG PO TABS: 12.5000 mg | ORAL_TABLET | Freq: Two times a day (BID) | ORAL | 0 refills | Status: DC

## 2017-09-04 NOTE — Progress Notes (Addendum)
Patient ID: Levi Brown, male   DOB: 1956-08-08, 61 y.o.   MRN: [id number]   Request for Left thoracentesis  Pt is alert/oriented to name; DOB Time; place  Imaged left chest: + small effusion on left Discussed risks and benefits of procedure Refusing procedure at this time  Sent back to room He will discuss with his MD  will need re order if decides to move ahead

## 2017-09-04 NOTE — Progress Notes (Signed)
Patient still needs oxygen.  O2 sats go down with ambulation and patient becomes short of breath.  PRN nebulizers.

## 2017-09-04 NOTE — Discharge Summary (Signed)
Discharge Summary  Levi Brown NLG:[id number] DOB: [birth year]  PCP: [name], FNP  Admit date: 08/25/2017 Discharge date: 09/04/2017  Time spent: >77mins, more than 50% time spent on coordination of care.  Recommendations for Outpatient Follow-up:  1. F/u with PMD within a week for hospital discharge follow up, repeat cbc/bmp at follow up. 2. F/u with oncology in one week, repeat cxr at follow up to assess left sided pleural effusion, patient is discharged with lasix 40mg  daily for a week, then lasix prn 3. F/u with radiation oncology 4. Home oxygen arranged.  Discharge Diagnoses:  Active Hospital Problems   Diagnosis Date Noted  . Edema 08/31/2017  . Hypokalemia 08/29/2017  . Acute respiratory failure with hypoxia and hypercapnia ([place]) 08/26/2017  . Pleural effusion on left 08/26/2017  . Collapse of left lung 08/26/2017  . Hyponatremia 08/26/2017  . COPD exacerbation ([place]) 08/26/2017  . Obstructive pneumonia 08/26/2017  . Non-small cell cancer of left lung ([place]) 07/13/2017    Resolved Hospital Problems   Diagnosis Date Noted Date Resolved  No resolved problems to display.    Discharge Condition: stable  Diet recommendation: regular diet  Filed Weights   08/31/17 0538 09/02/17 0521 09/04/17 0542  Weight: 71 kg (156 lb 8 oz) 75.3 kg (166 lb) 69.4 kg (152 lb 14.4 oz)    History of present illness: (per critical care admitting MD) CHIEF COMPLAINT:  Shortness of breath  HISTORY OF PRESENT ILLNESS:   61 yr old male with diagnosis of stage IV NSCLC undergoing radiation therapy coming in with progressive shortness of breath for the last 2 weeks. He noticed some swelling of his lower limbs so he was started on lasix by his PCP but did not help. He called for help today and was found to have pulse ox in the 60s. Patient was in the ED taken to CT scan and after he came back he became unresponsive with worsening respiratory status so he was intubated.   As per  sister patient did not have any fever or chills but did have sputum production and cough. No chest pain or lower extremity pain. Ct chest showed moderate left effusion with left upper lobe collapse from known mediastinal mass.    Hospital Course:  Active Problems:   Non-small cell cancer of left lung (H[facility])   Acute respiratory failure with hypoxia and hypercapnia (H[facility])   Pleural effusion on left   Collapse of left lung   Hyponatremia   COPD exacerbation ([facility])   Obstructive pneumonia   Hypokalemia   Edema   Acute respiratory failure with hypoxemia, required intubation and icu admission, extubated, now DNR, on o2 supplement.  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  Repeat thoracentesis (last on 9/13) since patient is feeling more sob, may eventually need pleurx placement Per IR, not enough fluids to tap, since patient is more sob and has persistent sinus tachycardia,  CTA chest on 9/21 no PE, left sided lung mass, moderate  pleural effusion. Patient declined repeat thoracentesis after he was brought down to radiology department for the procedure.  He elected to discharge home with home oxygen and oral lasix and repeat cxr in one weeks.   Sinus tachycardia  CTA chest 9/12=> negative for PE  tsh slightly elevated, free t4 wnl. Cardiac echo=> 9/17=> EF 55-60% Continue betablocker Repeat CTA on 9/21 no PE.  Fever 101  on admission > post obstructive pneumonia? possible -Blood culture no growth, trach aspirate with normal respiratory flora.  -DC vanco 9/15, d/c zosyn on 9/20, change to augmentin , he finished abx treatment in the hospital. No fever at discharge.  Edematous/volume overload Echo lvef 55-60%,  Venous doppler bilateral lower extremity no DVT Started iv lasix 40 mg bid on 9/19, discharged on oral lasix  40mg  daily for 7 days.  Bilateral compression stocking , elevate legs  Dizziness MRI brain w and w/o contrast => negativemetastatic disease   Right sided aortic arch Stable.  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  Anemia without bleeding hgb stable around 7-8, no sign of bleeding     STUDIES: 08/26/2017 CT angiogram chest> no pulmonary embolism, large left sided effusion and left sided mass both causing compressive atelectasis of the left lung, no PE  CULTURES: 9/12 blood > 9/13 resp culture >moderate GPC and GPR 9/14 pleural > gram stain negative   LINES/TUBES: 9/13 ETT >9/14     Code Status: DNR  Family Communication: patient and sister  Disposition Plan:  Discharge home with home o2   Consultants:  patient was admitted to icu on 9/13, transferred to hospitalist service on 9/15  Radiation oncology  Procedures:  Intubation/extubation  XRT  Antibiotics: 9/13 vanc >9/15 9/13 zosyn >9/20 augmentin from 9/20   Discharge Exam: BP 101/69 (BP Location: Right Arm)   Pulse (!) 125   Temp 98.9 F (37.2 C) (Oral)   Resp 20   Ht 5\' 5"  (1.651 m)   Wt 69.4 kg (152 lb 14.4 oz)   SpO2 94%   BMI 25.44 kg/m   General: NAD, ambulating in room Cardiovascular: sinus tachycardia Respiratory: diminished on the left, now weehzing Extremities: bilateral pitting edema  Discharge Instructions You were cared for by a  hospitalist during your hospital stay. If you have any questions about your discharge medications or the care you received while you were in the hospital after you are discharged, you can call the unit and asked to speak with the hospitalist on call if the hospitalist that took care of you is not available. Once you are discharged, your primary care physician will handle any further medical issues. Please note that NO REFILLS for any discharge medications will be authorized once you are discharged, as it is imperative that you return to your primary care physician (or establish a relationship with a primary care physician if you do not have one) for your aftercare needs so that they can reassess your need for medications and monitor your lab values.  Discharge Instructions    Diet general    Complete by:  As directed    Increase activity slowly    Complete by:  As directed      Allergies as of 09/04/2017   No Known Allergies     Medication List    STOP taking these medications   amLODipine 5 MG tablet Commonly known as:  NORVASC   Fluticasone Furoate 100 MCG/ACT Aepb  Commonly known as:  ARNUITY ELLIPTA   HYDROcodone-homatropine 5-1.5 MG/5ML syrup Commonly known as:  HYCODAN   ipratropium 0.02 % nebulizer solution Commonly known as:  ATROVENT   potassium chloride 10 MEQ tablet Commonly known as:  K-DUR     TAKE these medications   acetaminophen 325 MG tablet Commonly known as:  TYLENOL Take 325 mg by mouth every 6 (six) hours as needed for moderate pain.   albuterol 108 (90 Base) MCG/ACT inhaler Commonly known as:  PROVENTIL HFA;VENTOLIN HFA Inhale 2 puffs into the lungs every 4 (four) hours as needed for wheezing or shortness of breath (cough, shortness of breath or wheezing.). What changed:  Another medication with the same name was removed. Continue taking this medication, and follow the directions you see here.   aspirin EC 81 MG tablet Take 1 tablet (81 mg total) by  mouth daily.   COUGH DM PO Take 30 mLs by mouth daily as needed (cough).   DRY EYE FORMULA PO Place 1 drop into both eyes daily.   feeding supplement Liqd Take 1 Container by mouth 3 (three) times daily between meals.   fluticasone furoate-vilanterol 100-25 MCG/INH Aepb Commonly known as:  BREO ELLIPTA Inhale 1 puff into the lungs daily.   furosemide 20 MG tablet Commonly known as:  LASIX Take 1 tablet (20 mg total) by mouth daily as needed. What changed:  reasons to take this   furosemide 40 MG tablet Commonly known as:  LASIX Take 1 tablet (40 mg total) by mouth daily. What changed:  You were already taking a medication with the same name, and this prescription was added. Make sure you understand how and when to take each.   ipratropium-albuterol 0.5-2.5 (3) MG/3ML Soln Commonly known as:  DUONEB Take 3 mLs by nebulization every 4 (four) hours as needed.   metoprolol tartrate 25 MG tablet Commonly known as:  LOPRESSOR Take 0.5 tablets (12.5 mg total) by mouth 2 (two) times daily. What changed:  how much to take   ondansetron 4 MG disintegrating tablet Commonly known as:  ZOFRAN ODT Take 1 tablet (4 mg total) by mouth every 8 (eight) hours as needed for nausea or vomiting.   pantoprazole 40 MG tablet Commonly known as:  PROTONIX Take 1 tablet (40 mg total) by mouth at bedtime.   potassium chloride SA 20 MEQ tablet Commonly known as:  K-DUR,KLOR-CON Take 1 tablet (20 mEq total) by mouth daily.   RADIAPLEX EX Apply topically.   sucralfate 1 g tablet Commonly known as:  CARAFATE Take 1 tablet (1 g total) by mouth 4 (four) times daily -  with meals and at bedtime. Dissolve tablet in 15 cc of water and drink 15 minutes before meals and at bedtime.   traZODone 50 MG tablet Commonly known as:  DESYREL Take 0.5-1 tablets (25-50 mg total) by mouth at bedtime as needed for sleep.            Durable Medical Equipment        Start     Ordered   09/04/17 1052   DME Oxygen  Once    Question Answer Comment  Mode or (Route) Nasal cannula   Liters per Minute 2   Frequency Continuous (stationary and portable oxygen unit needed)   Oxygen conserving device Yes   Oxygen delivery system Gas      09/04/17 1051   08/28/17 1045  For home use only DME oxygen  Once    Question Answer Comment  Mode or (Route) Nasal cannula   Frequency Continuous (stationary and portable oxygen unit needed)   Oxygen conserving device Yes   Oxygen delivery system Gas      08/28/17 1046       Discharge Care Instructions        Start     Ordered   09/04/17 0000  feeding supplement (BOOST / RESOURCE BREEZE) LIQD  3 times daily between meals     09/04/17 1051   09/04/17 0000  furosemide (LASIX) 40 MG tablet  Daily     09/04/17 1051   09/04/17 0000  metoprolol tartrate (LOPRESSOR) 25 MG tablet  2 times daily     09/04/17 1051   09/04/17 0000  Increase activity slowly     09/04/17 1051   09/04/17 0000  Diet general     09/04/17 1051   08/31/17 0000  fluticasone furoate-vilanterol (BREO ELLIPTA) 100-25 MCG/INH AEPB  Daily     08/31/17 0706   08/31/17 0000  ipratropium-albuterol (DUONEB) 0.5-2.5 (3) MG/3ML SOLN  Every 4 hours PRN     08/31/17 0706   08/31/17 0000  pantoprazole (PROTONIX) 40 MG tablet  Daily at bedtime     08/31/17 0706     No Known Allergies Follow-up Information    Ardath Sax, MD Follow up in 1 week(s).   Specialty:  Hematology and Oncology Why:  repeat chest x ray to access left sided pleural effusion Contact information: [place] [address] [phone]        continue radiation therapy as already scheduled. Follow up.        [facility] Follow up.   Why:  will deliver portable oxygen to room prior to discharge and concentrator to your home Contact information: [address] [place] [address] [phone]            The results of significant diagnostics from this  hospitalization (including imaging, microbiology, ancillary and laboratory) are listed below for reference.    Significant Diagnostic Studies: Dg Chest 2 View  Result Date: 09/01/2017 CLINICAL DATA:  Pleural effusion EXAM: CHEST  2 VIEW COMPARISON:  08/29/2017 FINDINGS: Moderate to large left pleural effusion is similar. There is associated left central lung collapse/consolidation. Tiny right pleural effusion again noted. Right-sided aortic arch again noted. The visualized bony structures of the thorax are intact. Telemetry leads overlie the chest. IMPRESSION: 1. No substantial interval change. Moderate to large left pleural effusion with left lung collapse/ consolidation. 2. Tiny right pleural effusion 3. Right aortic arch. Electronically Signed   By: Misty Stanley M.D.   On: 09/01/2017 12:39   Dg Chest 2 View  Result Date: 08/29/2017 CLINICAL DATA:  Shortness of breath. History of lung cancer. Pleural effusion. EXAM: CHEST  2 VIEW COMPARISON:  08/27/2017 FINDINGS: The patient has a known large left hilar mass invading the mediastinum and a drowned lung appearance of the left upper lobe on CT scan from 4 days ago. There is also previously a significant left pleural effusion. Volume loss is again observed in the left hemithorax with elevation of left hemidiaphragm. Left pleural effusion again noted. The amount of aerated left lung, while small, is slightly improved from the 08/27/2017 exam. Right-sided aortic arch. Subtle blunting of the right costophrenic angle. IMPRESSION: 1. Minimally improved aeration in the left lung although only a small portion of the lung is aerated. Opacification in much of the left hemithorax due to known mass common drowned  lung, and large left pleural effusion. 2. Trace right pleural effusion. 3. Right-sided aortic arch noted. Electronically Signed   By: Van Clines M.D.   On: 08/29/2017 12:17   Ct Angio Chest Pe W Or Wo Contrast  Addendum Date: 09/03/2017   ADDENDUM  REPORT: 09/03/2017 18:36 ADDENDUM: Stable lytic metastasis in the sternum, hypermetabolic on recent PET-CT. Electronically Signed   By: Claudie Revering M.D.   On: 09/03/2017 18:36   Result Date: 09/03/2017 CLINICAL DATA:  Progressive shortness of breath. Acute respiratory failure. Undergoing radiation therapy for stage IV non-small cell lung carcinoma. EXAM: CT ANGIOGRAPHY CHEST WITH CONTRAST TECHNIQUE: Multidetector CT imaging of the chest was performed using the standard protocol during bolus administration of intravenous contrast. Multiplanar CT image reconstructions and MIPs were obtained to evaluate the vascular anatomy. CONTRAST:  100 cc Isovue 370 COMPARISON:  08/25/2017. FINDINGS: Cardiovascular: The heart remains borderline enlarged. A right-sided aortic arch and descending thoracic aorta are again demonstrated with a retroesophageal left subclavian artery. The distal margin proximal descending aorta remain dilated, with a maximum diameter of 4.0 cm on sagittal image number 124 of series 10. Normally opacified pulmonary arteries with no pulmonary arterial filling defects. The left main pulmonary artery and its immediate branches are compressed by an encasing mass without occlusion. Mediastinum/Nodes: Mediastinal shift to the right by the previously demonstrated large left lung mass, invading the hilum and mediastinum. The shift has increased. No enlarged lymph nodes separate from the mass. Unremarkable thyroid gland. Lungs/Pleura: The large left lung and mediastinal mass measures 10.3 x 7.3 cm on image number 41 of series 4, previously 10.8 x 7.9 cm in corresponding dimensions. There is slightly less compression of the left mainstem bronchus with patency of the left upper lobe bronchus currently demonstrated. No significant change in a moderate-sized left pleural effusion and compressive atelectasis of the left lower lobe. No lung nodules seen on the right. Upper Abdomen: Mild left adrenal hyperplasia is  again demonstrated no mass seen. Musculoskeletal: 2.8 cm oval lytic lesion in the sternum on the left. Review of the MIP images confirms the above findings. IMPRESSION: 1. No pulmonary emboli seen. 2. Slight interval decrease in size of the large left upper lobe mass invading the mediastinum with vascular and bronchial encasement. 3. Continued compression of the left main pulmonary artery and marked compression of the left upper lobe pulmonary artery due to the mass. 4. The left mainstem bronchus and left upper lobe bronchus are currently patent, although somewhat compressed. 5. Stable moderate-sized left pleural effusion and compressive atelectasis of the left lower lobe. 6. Slightly progressive mediastinal shift to the right. 7. 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 Electronically Signed: By: Claudie Revering M.D. On: 09/03/2017 18:06   Ct Angio Chest Pe W Or Wo Contrast  Result Date: 08/25/2017 CLINICAL DATA:  Dyspnea, orthostasis, confusion and tachycardia. Hypoxia with pulse oximeter 67%. Patient canceled radiation therapy today due to progressive weakness. Has not started chemotherapy. EXAM: CT ANGIOGRAPHY CHEST WITH CONTRAST TECHNIQUE: Multidetector CT imaging of the chest was performed using the standard protocol during bolus administration of intravenous contrast. Multiplanar CT image reconstructions and MIPs were obtained to evaluate the vascular anatomy. CONTRAST:  100 cc Isovue 370 IV COMPARISON:  None. FINDINGS: Cardiovascular: Right-sided aortic arch with aberrant left subclavian artery. No acute pulmonary embolus. Marked narrowing of the left main pulmonary artery secondary  to previously described mediastinal soft tissue mass. No aortic aneurysm or dissection. Heart size is normal without pericardial effusion. Mediastinum/Nodes:  The previously described soft tissue mass within the superior mediastinal causing mass effect on the left main pulmonary artery, obliterating the left upper lobe bronchus and impinging upon the left upper lobe bronchus is less distinct due to adjacent new left upper lobe atelectasis likely secondary to postobstructive involvement of the left upper lobe bronchus Lungs/Pleura: New moderate left effusion with new left lower lobe compressive atelectasis. Postobstructive collapse of the left upper lobe is more expensive since prior exam with only a scant amount of aerated lingula remaining. The contralateral right lung remains aerated without pneumonic consolidation or effusion. No pneumothorax. Upper Abdomen: No acute abnormality. Musculoskeletal: No chest wall abnormality. No acute or significant osseous findings. Review of the MIP images confirms the above findings. IMPRESSION: 1. New moderate left-sided pleural effusion with left lower lobe compressive atelectasis. Only a scant amount of aerated lung remains within the left hemithorax. 2. Further progression of left upper lobe collapse likely postobstructive in etiology secondary to previously described mediastinal soft tissue mass. Where the margins of this mass ends and the beginning of atelectatic lung begins is difficult to determine due to similar soft tissue attenuation of these abutting abnormalities. This mass is again noted to obstruct the left upper lobe bronchus, markedly narrowing the left main pulmonary artery and partially narrow the left lower lobe bronchus. 3. Right-sided aortic arch with aberrant left subclavian artery. Electronically Signed   By: Ashley Royalty M.D.   On: 08/25/2017 21:37   Mr Jeri Cos XN Contrast  Result Date: 08/30/2017 CLINICAL DATA:  Metastatic lung cancer.  Progressive dizziness. EXAM: MRI HEAD WITHOUT AND WITH CONTRAST TECHNIQUE: Multiplanar, multiecho pulse sequences of the brain and surrounding structures were obtained  without and with intravenous contrast. CONTRAST:  20 cc MultiHance COMPARISON:  07/07/2017 FINDINGS: Brain: Diffusion imaging does not show any acute or subacute infarction. The brainstem and cerebellum are normal except for mild chronic small-vessel change of the pons. 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 No large vessel territory infarction. No mass lesion, hemorrhage, hydrocephalus or extra-axial collection. Incidental venous angioma right frontal lobe. Vascular: Major vessels at the base of the brain show flow. Skull and upper cervical spine: Negative Sinuses/Orbits: Clear/normal Other: None IMPRESSION: No acute finding.  No evidence of metastatic disease. 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 Electronically Signed   By: Nelson Chimes M.D.   On: 08/30/2017 07:28   [place] Chest (pleural Effusion)  Result Date: 09/04/2017 CLINICAL DATA:  Evaluate pleural effusion. EXAM: CHEST ULTRASOUND COMPARISON:  Chest CT September 03, 2017 FINDINGS: There is a small left pleural effusion. IMPRESSION: A small left pleural effusion remains. Electronically Signed   By: Dorise Bullion III M.D   On: 09/04/2017 09:57   [place] Chest (pleural Effusion)  Result Date: 09/03/2017 CLINICAL DATA:  Evaluate left pleural effusion for possible thoracentesis. EXAM: CHEST ULTRASOUND COMPARISON:  Chest x-ray 09/01/2017 FINDINGS: Exam demonstrates a small amount of left pleural fluid. IMPRESSION: Small left pleural effusion not sufficient enough to indicate thoracentesis. Electronically Signed   By: Marin Olp M.D.   On: 09/03/2017 15:43   Dg Chest Port 1 View  Result Date: 08/27/2017 CLINICAL DATA:  Acute respiratory failure with hypoxia. History of left lung cancer. EXAM: PORTABLE CHEST 1 VIEW COMPARISON:  08/26/2017 FINDINGS: Exam demonstrates stable postsurgical change  with volume loss of the left lung with elevation of the left hemidiaphragm. There is worsening opacification over the mid to lower left lung likely worsening effusion with atelectasis. Cannot exclude infection in the mid to lower left lung. Right lung is clear. Cardiomediastinal silhouette and remainder of the exam is unchanged. IMPRESSION: Interval worsening of small left pleural effusion likely with associated atelectasis. Cannot exclude infection in the left base. Stable postsurgical change/ volume loss of the left lung. Electronically Signed   By: Marin Olp M.D.   On: 08/27/2017 07:08   Dg Chest Port 1 View  Result Date: 08/26/2017 CLINICAL DATA:  Status post left side thoracentesis. 500cc removed. Hx of left lung mass. EXAM: PORTABLE CHEST 1 VIEW COMPARISON:  08/25/2017 FINDINGS: There is persistent elevation of left hemidiaphragm. No pneumothorax. Mediastinal mass again identified. The right lung is clear. No pulmonary edema. IMPRESSION: No pneumothorax following thoracentesis. Electronically Signed   By: Nolon Nations M.D.   On: 08/26/2017 11:31   Dg Chest Portable 1 View  Result Date: 08/25/2017 CLINICAL DATA:  Intubation and NG tube placement. EXAM: PORTABLE CHEST 1 VIEW COMPARISON:  Radiographs and chest CT earlier this day. FINDINGS: Endotracheal tube tip 5.2 cm from the carina. Enteric tube in place, and tip below the diaphragm not included in the field of view. Again seen volume loss in the left hemithorax with left pleural effusion. Right paratracheal density corresponds to right aortic arch. Right lung is clear. IMPRESSION: 1. Endotracheal tube tip 5.2 cm from the carina. Enteric tube in place, tip below the diaphragm not included in the field of view. 2. Unchanged left lung volume loss and left pleural effusion. Mediastinal mass evaluated on chest CT earlier this day. Electronically Signed   By: Jeb Levering M.D.   On: 08/25/2017 22:32   Dg Chest Port 1 View  Result Date:  08/25/2017 CLINICAL DATA:  Shortness of breath. History of LEFT lung cancer, RIGHT-sided aortic arch with vascular loop. EXAM: PORTABLE CHEST 1 VIEW COMPARISON:  PET-CT July 29, 2017 FINDINGS: Elevated LEFT hemidiaphragm, LEFT lung volume loss with perihilar consolidation with air bronchograms. Small LEFT pleural effusion. RIGHT lung is clear. The cardiac silhouette is mildly enlarged, limited assessment due to LEFT process. No pneumothorax. Soft tissue planes and included osseous structures are nonsuspicious, known osseous metastasis not apparent by plain radiography. IMPRESSION: Worsening LEFT lung atelectasis and consolidation/pneumonia, with underlying known LEFT lung mass. Small LEFT pleural effusion. Electronically Signed   By: Elon Alas M.D.   On: 08/25/2017 19:08    Microbiology: Recent Results (from the past 240 hour(s))  Blood culture (routine x 2)     Status: None   Collection Time: 08/25/17  5:25 PM  Result Value Ref Range Status   Specimen Description BLOOD LEFT HAND  Final   Special Requests   Final    IN PEDIATRIC BOTTLE Blood Culture results may not be optimal due to an excessive volume of blood received in culture bottles   Culture   Final    NO GROWTH 5 DAYS Performed at [facility], [place] [address], [place], [place] [address]    Report Status 08/30/2017 FINAL  Final  Blood culture (routine x 2)     Status: None   Collection Time: 08/25/17  5:27 PM  Result Value Ref Range Status   Specimen Description BLOOD LEFT ANTECUBITAL  Final   Special Requests   Final    BOTTLES DRAWN  AEROBIC AND ANAEROBIC Blood Culture adequate volume   Culture   Final    NO GROWTH 5 DAYS Performed at [facility], [place] [address], [place], [place] [address]    Report Status 08/30/2017 FINAL  Final  MRSA PCR Screening     Status: None   Collection Time: 08/26/17  1:31 AM  Result Value Ref Range Status   MRSA by PCR NEGATIVE NEGATIVE Final    Comment:        The GeneXpert  MRSA Assay (FDA approved for NASAL specimens only), is one component of a comprehensive MRSA colonization surveillance program. It is not intended to diagnose MRSA infection nor to guide or monitor treatment for MRSA infections.   Culture, respiratory (NON-Expectorated)     Status: None   Collection Time: 08/26/17  9:48 AM  Result Value Ref Range Status   Specimen Description TRACHEAL ASPIRATE  Final   Special Requests NONE  Final   Gram Stain   Final    ABUNDANT WBC PRESENT, PREDOMINANTLY PMN MODERATE GRAM POSITIVE RODS MODERATE GRAM POSITIVE COCCI    Culture   Final    Consistent with normal respiratory flora. Performed at [facility], [place] [address], [place], [place] [address]    Report Status 08/28/2017 FINAL  Final  Body fluid culture (includes gram stain)     Status: None   Collection Time: 08/26/17 10:45 AM  Result Value Ref Range Status   Specimen Description PLEURAL  Final   Special Requests NONE  Final   Gram Stain   Final    FEW WBC PRESENT,BOTH PMN AND MONONUCLEAR NO ORGANISMS SEEN    Culture   Final    NO GROWTH 3 DAYS Performed at [facility], [place] [address], [place], [place] [address]    Report Status 08/29/2017 FINAL  Final     Labs: Basic Metabolic Panel:  Recent Labs Lab 08/31/17 0644 09/01/17 0616 09/02/17 0642 09/03/17 0608 09/04/17 0615  NA 133* 136 138 134* 134*  K 3.4* 3.3* 3.4* 3.2* 3.6  CL 83* 81* 79* 76* 79*  CO2 44* 47* 49* 50* 43*  GLUCOSE 113* 103* 111* 106* 118*  BUN <5* <5* <5* <5* <5*  CREATININE 0.42* 0.40* 0.47* 0.46* 0.44*  CALCIUM 8.4* 8.8* 8.9 8.8* 9.2  MG  --   --  1.6* 1.9 1.8   Liver Function Tests:  Recent Labs Lab 08/29/17 0658 08/30/17 0732 08/31/17 0644  AST 19 17 14*  ALT 49 42 37  ALKPHOS 105 100 100  BILITOT 0.3 0.4 0.5  PROT 5.6* 5.7* 5.5*  ALBUMIN 2.0* 2.1* 2.1*   No results for input(s): LIPASE, AMYLASE in the last 168 hours. No results for input(s): AMMONIA in the last 168  hours. CBC:  Recent Labs Lab 08/31/17 0644 09/01/17 0616 09/02/17 0642 09/03/17 0608 09/04/17 0615  WBC 10.9* 9.0 8.3 7.0 10.1  NEUTROABS  --   --  6.8 5.8 8.6*  HGB 7.7* 8.0* 7.4* 7.6* 8.4*  HCT 24.6* 26.1* 24.1* 24.7* 26.7*  MCV 93.2 93.2 91.3 90.8 89.9  PLT 555* 538* 490* 482* 533*   Cardiac Enzymes: No results for input(s): CKTOTAL, CKMB, CKMBINDEX, TROPONINI in the last 168 hours. BNP: BNP (last 3 results)  Recent Labs  08/25/17 1728  BNP 219.2*    ProBNP (last 3 results) No results for input(s): PROBNP in the last 8760 hours.  CBG: No results for input(s): GLUCAP in the last 168 hours.  SignedFlorencia Reasons MD, PhD  [facility] 09/04/2017, 3:47 PM

## 2017-09-04 NOTE — Care Management Note (Signed)
Case Management Note  Patient Details  Name: Levi Brown MRN: [id number] Date of Birth: Jan 12, 1956  Subjective/Objective:     Non-small cell cancer of left lung, acute resp failure, COPD               Action/Plan: Discharge Planning: NCM spoke to pt and lives at home with Rico Junker. Explained [facility] will deliver portable oxygen tank to his room and concentrator to his home this afternoon. Has neb machine at home.  Contacted [facility] with new referral.   PCP Molli Barrows S   Expected Discharge Date:  09/04/17               Expected Discharge Plan:  Home/Self Care  In-House Referral:  NA  Discharge planning Services  CM Consult  Post Acute Care Choice:  NA Choice offered to:  NA  DME Arranged:  Oxygen DME Agency:  [place]:  NA [place] Agency:  NA  Status of Service:  Completed, signed off  If discussed at [place] of Stay Meetings, dates discussed:    Additional Comments:  Erenest Rasher, RN 09/04/2017, 12:00 PM

## 2017-09-06 ENCOUNTER — Ambulatory Visit (INDEPENDENT_AMBULATORY_CARE_PROVIDER_SITE_OTHER): Payer: Medicaid Other | Admitting: Family Medicine

## 2017-09-06 ENCOUNTER — Ambulatory Visit
Admission: RE | Admit: 2017-09-06 | Discharge: 2017-09-06 | Disposition: A | Discharge status: Home / Self Care | Payer: Medicaid Other | Source: Ambulatory Visit | Attending: Radiation Oncology | Admitting: Radiation Oncology

## 2017-09-06 ENCOUNTER — Encounter: Payer: Self-pay | Admitting: Family Medicine

## 2017-09-06 VITALS — BP 100/62 | HR 99 | Temp 98.0°F | Resp 14 | Ht 65.0 in

## 2017-09-06 DIAGNOSIS — J9601 Acute respiratory failure with hypoxia: Secondary | ICD-10-CM

## 2017-09-06 DIAGNOSIS — Z825 Family history of asthma and other chronic lower respiratory diseases: Secondary | ICD-10-CM | POA: Diagnosis not present

## 2017-09-06 DIAGNOSIS — Z9981 Dependence on supplemental oxygen: Secondary | ICD-10-CM

## 2017-09-06 DIAGNOSIS — J9811 Atelectasis: Secondary | ICD-10-CM | POA: Diagnosis not present

## 2017-09-06 DIAGNOSIS — C3412 Malignant neoplasm of upper lobe, left bronchus or lung: Secondary | ICD-10-CM

## 2017-09-06 DIAGNOSIS — J9602 Acute respiratory failure with hypercapnia: Secondary | ICD-10-CM

## 2017-09-06 DIAGNOSIS — Z510 Encounter for antineoplastic radiation therapy: Secondary | ICD-10-CM | POA: Diagnosis not present

## 2017-09-06 DIAGNOSIS — Z79899 Other long term (current) drug therapy: Secondary | ICD-10-CM | POA: Diagnosis not present

## 2017-09-06 DIAGNOSIS — Q278 Other specified congenital malformations of peripheral vascular system: Secondary | ICD-10-CM | POA: Diagnosis not present

## 2017-09-06 DIAGNOSIS — Z87891 Personal history of nicotine dependence: Secondary | ICD-10-CM | POA: Diagnosis not present

## 2017-09-06 DIAGNOSIS — R222 Localized swelling, mass and lump, trunk: Secondary | ICD-10-CM | POA: Diagnosis not present

## 2017-09-06 DIAGNOSIS — R490 Dysphonia: Secondary | ICD-10-CM | POA: Diagnosis not present

## 2017-09-06 DIAGNOSIS — R000 Tachycardia, unspecified: Secondary | ICD-10-CM | POA: Diagnosis not present

## 2017-09-06 DIAGNOSIS — Z809 Family history of malignant neoplasm, unspecified: Secondary | ICD-10-CM | POA: Diagnosis not present

## 2017-09-06 DIAGNOSIS — M7989 Other specified soft tissue disorders: Secondary | ICD-10-CM

## 2017-09-06 DIAGNOSIS — Z9889 Other specified postprocedural states: Secondary | ICD-10-CM | POA: Diagnosis not present

## 2017-09-06 DIAGNOSIS — Z7982 Long term (current) use of aspirin: Secondary | ICD-10-CM | POA: Diagnosis not present

## 2017-09-06 DIAGNOSIS — Q2547 Right aortic arch: Secondary | ICD-10-CM | POA: Diagnosis not present

## 2017-09-06 MED ORDER — PANTOPRAZOLE SODIUM 40 MG PO TBEC: 40.0000 mg | DELAYED_RELEASE_TABLET | Freq: Every day | ORAL | 3 refills | Status: DC

## 2017-09-06 MED ORDER — METOPROLOL TARTRATE 25 MG PO TABS: 12.5000 mg | ORAL_TABLET | Freq: Two times a day (BID) | ORAL | 0 refills | Status: DC

## 2017-09-06 MED ORDER — AMLODIPINE BESYLATE 5 MG PO TABS
5.0000 mg | ORAL_TABLET | Freq: Every day | ORAL | 1 refills | Status: DC
Start: 2017-09-06 — End: 2017-09-29

## 2017-09-06 NOTE — Patient Instructions (Addendum)
Complete furosemide 40 mg once daily until medication is complete. Take 1 potassium tablet with each dose of furosemide.  Complete last dose of amoxicillin. Continue Amlodipine and metoprolol as prescribed. Return next Wednesday  , October 3rd for post hospital labs and follow-up on foot swelling. Continue oxygen. Return for care sooner if needed.   Home Oxygen Use, Adult When a medical condition keeps you from getting enough oxygen, your health care provider may instruct you to take extra oxygen at home. Your health care provider will let you know:  When to take oxygen.  For how long to take oxygen.  How quickly oxygen should be delivered (flow rate), in liters per minute (LPM or L/M).  Home oxygen can be given through:  A mask.  A nasal cannula. This is a device or tube that goes in the nostrils.  A transtracheal catheter. This is a small, flexible tube placed in the trachea.  A tracheostomy. This is a surgically made opening in the trachea.  These devices are connected with tubing to an oxygen source, such as:  A tank. Tanks hold oxygen in gas form. They must be replaced when the oxygen is used up.  A liquid oxygen device. This holds oxygen in liquid form. It must be replaced when the oxygen is used up.  An oxygen concentrator machine. This filters oxygen in the room. It uses electricity, so you must have a backup cylinder of oxygen in case the power goes out.  Supplies needed: To use oxygen, you will need:  A mask, nasal cannula, transtracheal catheter, or tracheostomy.  An oxygen tank, a liquid oxygen device, or an oxygen concentrator.  The tape that your health care provider recommends (optional).  If you use a transtracheal catheter and your prescribed flow rate is 1 LPM or greater, you will also need a humidifier. Risks and complications  Fire. This can happen if the oxygen is exposed to a heat source, flame, or spark.  Injury to skin. This can happen if liquid  oxygen touches your skin.  Organ damage. This can happen if you get too little oxygen. How to use oxygen Your health care provider will show you how to use your oxygen device. Follow her or his instructions. They may look something like this: 1. Wash your hands. 2. If you use an oxygen concentrator, make sure it is plugged in. 3. Place one end of the tube into the port on the tank, device, or machine. 4. Place the mask over your nose and mouth. Or, place the nasal cannula and secure it with tape if instructed. If you use a tracheostomy or transtracheal catheter, connect it to the oxygen source as directed. 5. Make sure the liter-flow setting on the machine is at the level prescribed by your health care provider. 6. Turn on the machine or adjust the knob on the tank or device to the correct liter-flow setting. 7. When you are done, turn off and unplug the machine, or turn the knob to OFF.  How to clean and care for the oxygen supplies Nasal cannula  Clean it with a warm, wet cloth daily or as needed.  Wash it with a liquid soap once a week.  Rinse it thoroughly once or twice a week.  Replace it every 2-4 weeks.  If you have an infection, such as a cold or pneumonia, change the cannula when you get better. Mask  Replace it every 2-4 weeks.  If you have an infection, such as a cold  or pneumonia, change the mask when you get better. Humidifier bottle  Wash the bottle between each refill: ? Wash it with soap and warm water. ? Rinse it thoroughly. ? Disinfect it and its top. ? Air-dry it.  Make sure it is dry before you refill it. Oxygen concentrator  Clean the air filter at least twice a week according to directions from your home medical equipment and service company.  Wipe down the cabinet every day. To do this: ? Unplug the unit. ? Wipe down the cabinet with a damp cloth. ? Dry the cabinet. Other equipment  Change any extra tubing every 1-3 months.  Follow  instructions from your health care provider about taking care of any other equipment. Safety tips Fire safety tips   Keep your oxygen and oxygen supplies at least 5 ft away from sources of heat, flames, and sparks at all times.  Do not allow smoking near your oxygen. Put up "no smoking" signs in your home.  Do not use materials that can burn (are flammable) while you use oxygen.  When you go to a restaurant with portable oxygen, ask to be seated in the nonsmoking section.  Keep a Data processing manager close by. Let your fire department know that you have oxygen in your home.  Test your home smoke detectors regularly. General safety tips  If you use an oxygen cylinder, make sure it is in a stand or secured to an object that will not move (fixed object).  If you use liquid oxygen, make sure its container is kept upright.  If you use an oxygen concentrator: ? Dance movement psychotherapist company. Make sure you are given priority service in the event that your power goes out. ? Avoid using extension cords, if possible. Follow these instructions at home:  Use oxygen only as told by your health care provider.  Do not use alcohol or other drugs that make you relax (sedating drugs) unless instructed. They can slow down your breathing rate and make it hard to get in enough oxygen.  Know how and when to order a refill of oxygen.  Always keep a spare tank of oxygen. Plan ahead for holidays when you may not be able to get a prescription filled.  Use water-based lubricants on your lips or nostrils. Do not use oil-based products like petroleum jelly.  To prevent skin irritation on your cheeks or behind your ears, tuck some gauze under the tubing. Contact a health care provider if:  You get headaches often.  You have shortness of breath.  You have a lasting cough.  You have anxiety.  You are sleepy all the time.  You develop an illness that affects your breathing.  You cannot exercise at your  regular level.  You are restless.  You have difficult or irregular breathing, and it is getting worse.  You have a fever.  You have persistent redness under your nose. Get help right away if:  You are confused.  You have blue lips or fingernails.  You are struggling to breathe. This information is not intended to replace advice given to you by your health care provider. Make sure you discuss any questions you have with your health care provider. Document Released: 02/20/2004 Document Revised: 07/29/2016 Document Reviewed: 06/23/2016 Elsevier Interactive Patient Education  2017 [employer].

## 2017-09-06 NOTE — Progress Notes (Signed)
Patient ID: Levi Brown, male    DOB: 02/01/1956, 61 y.o.   MRN: [id number]  PCP: [name], FNP  Chief Complaint  Patient presents with  . Hospitalization Follow-up    Subjective:  HPI Levi Brown is a 61 y.o. male with non-small cell cancer of left lung,  presents for hospital follow-up. Levi Brown was recently hospitalized 08/25/2017 for acute respiratory failure and worsening lower extremity swelling. During hospitalization, he was intubated after becoming unresponsive. He was treated for pneumonia, acute respiratory failure, pleural effusion of left ling, and collapse of left lung. Levi Brown was discharge 09/04/2017 on 2 liters of oxygen, continuous. He continues to experience lower extremity swelling in spite of lasix therapy. He is wearing compression socks regularly.  Shortness of breath improved with oxygen. Continue to experience pronounced weakness and fatigue. Improved quality of sleep with trazodone.  Social History   Social History  . Marital status: Single    Spouse name: N/A  . Number of children: N/A  . Years of education: N/A   Occupational History  . Not on file.   Social History Main Topics  . Smoking status: Former Smoker    Packs/day: 1.00    Years: 30.00    Types: Cigarettes    Quit date: 2017  . Smokeless tobacco: Never Used     Comment: quit smoking in 2017  . Alcohol use 8.4 - 12.6 oz/week    14 - 21 Cans of beer per week  . Drug use: No  . Sexual activity: No   Other Topics Concern  . Not on file   Social History Narrative  . No narrative on file    Family History  Problem Relation Age of Onset  . Asthma Mother   . COPD Mother   . Cancer Mother        breast?   Review of Systems See HPI  Patient Active Problem List   Diagnosis Date Noted  . Edema 08/31/2017  . Hypokalemia 08/29/2017  . Acute respiratory failure with hypoxia and hypercapnia ([place]) 08/26/2017  . Pleural effusion on left 08/26/2017  . Collapse of left lung  08/26/2017  . Hyponatremia 08/26/2017  . COPD exacerbation ([place]) 08/26/2017  . Obstructive pneumonia 08/26/2017  . Non-small cell cancer of left lung ([place])   . Non-small cell cancer of left lung ([place]) 07/13/2017  . Elevated blood pressure reading 07/05/2017    No Known Allergies  Prior to Admission medications   Medication Sig Start Date End Date Taking? Authorizing Provider  amLODipine (NORVASC) 5 MG tablet Take 5 mg by mouth daily.   Yes [provider]  aspirin EC 81 MG tablet Take 1 tablet (81 mg total) by mouth daily. 07/08/17 07/08/18 Yes Florencia Reasons, MD  feeding supplement (BOOST / RESOURCE BREEZE) LIQD Take 1 Container by mouth 3 (three) times daily between meals. 09/04/17  Yes Florencia Reasons, MD  furosemide (LASIX) 20 MG tablet Take 1 tablet (20 mg total) by mouth daily as needed. Patient taking differently: Take 20 mg by mouth daily as needed for fluid or edema.  08/23/17  Yes [name], FNP  ipratropium-albuterol (DUONEB) 0.5-2.5 (3) MG/3ML SOLN Take 3 mLs by nebulization every 4 (four) hours as needed. 08/31/17  Yes Jani Gravel, MD  metoprolol tartrate (LOPRESSOR) 25 MG tablet Take 0.5 tablets (12.5 mg total) by mouth 2 (two) times daily. 09/04/17  Yes Florencia Reasons, MD  Multiple Vitamins-Minerals (DRY EYE FORMULA PO) Place 1 drop into both eyes  daily.   Yes [provider]  pantoprazole (PROTONIX) 40 MG tablet Take 1 tablet (40 mg total) by mouth at bedtime. 08/31/17  Yes Jani Gravel, MD  potassium chloride SA (K-DUR,KLOR-CON) 20 MEQ tablet Take 1 tablet (20 mEq total) by mouth daily. 08/17/17  Yes [name], FNP  Wound Cleansers (RADIAPLEX EX) Apply topically.   Yes [provider]  acetaminophen (TYLENOL) 325 MG tablet Take 325 mg by mouth every 6 (six) hours as needed for moderate pain.    [provider]  albuterol (PROVENTIL HFA;VENTOLIN HFA) 108 (90 Base) MCG/ACT inhaler Inhale 2 puffs into the lungs every 4 (four) hours as needed for wheezing  or shortness of breath (cough, shortness of breath or wheezing.). Patient not taking: Reported on 09/06/2017 08/17/17   Tresa Garter, MD  Dextromethorphan Polistirex (COUGH DM PO) Take 30 mLs by mouth daily as needed (cough).    [provider]  fluticasone furoate-vilanterol (BREO ELLIPTA) 100-25 MCG/INH AEPB Inhale 1 puff into the lungs daily. Patient not taking: Reported on 09/06/2017 08/31/17   Jani Gravel, MD  furosemide (LASIX) 40 MG tablet Take 1 tablet (40 mg total) by mouth daily. Patient not taking: Reported on 09/06/2017 09/04/17 09/11/17  Florencia Reasons, MD  ondansetron (ZOFRAN ODT) 4 MG disintegrating tablet Take 1 tablet (4 mg total) by mouth every 8 (eight) hours as needed for nausea or vomiting. Patient not taking: Reported on 09/06/2017 08/23/17   [name], FNP  sucralfate (CARAFATE) 1 g tablet Take 1 tablet (1 g total) by mouth 4 (four) times daily -  with meals and at bedtime. Dissolve tablet in 15 cc of water and drink 15 minutes before meals and at bedtime. Patient not taking: Reported on 09/06/2017 08/18/17   Hayden Pedro, PA-C  traZODone (DESYREL) 50 MG tablet Take 0.5-1 tablets (25-50 mg total) by mouth at bedtime as needed for sleep. Patient not taking: Reported on 09/06/2017 08/23/17   [name], FNP    Past Medical, Surgical Family and Social History reviewed and updated.    Objective:   Today's Vitals   09/06/17 1130  BP: 100/62  Pulse: 99  Resp: 14  Temp: 98 F (36.7 C)  TempSrc: Oral  SpO2: 100%  Height: 5\' 5"  (1.651 m)    Wt Readings from Last 3 Encounters:  09/04/17 152 lb 14.4 oz (69.4 kg)  09/04/17 152 lb 14.7 oz (69.4 kg)  08/23/17 159 lb 12.8 oz (72.5 kg)   Physical Exam  Constitutional: He is oriented to person, place, and time. He appears well-developed and well-nourished. Nasal cannula in place.  HENT:  Head: Normocephalic and atraumatic.  Eyes: Pupils are equal, round, and reactive to light. Conjunctivae and EOM  are normal.  Cardiovascular: Normal rate, regular rhythm, normal heart sounds and intact distal pulses.   Pulmonary/Chest: Effort normal. He has decreased breath sounds. He has no wheezes. He has no rales.  2 liter nasal canula   Musculoskeletal: Normal range of motion. He exhibits edema.  +3 bilateral lower leg edema  Neurological: He is alert and oriented to person, place, and time.  Skin: Skin is warm and dry.  Psychiatric: He has a normal mood and affect. His behavior is normal. Judgment and thought content normal.   Assessment & Plan:  1. Non-small cell cancer of left lung [facility]), continue oncology follow-up and treatment. 2. Acute respiratory failure with hypoxia and hypercapnia ([place]), continue oxygen therapy and nebulizer treatments. 3. Dependence on continuous supplemental  oxygen 4. Lower extremity swelling, continue compression socks and furosemide 20 mg daily.  RTC: 2 weeks, repeat labs, evaluate respiratory status    Carroll Sage. Kenton Kingfisher, MSN, FNP-C [facility]  [address] [place], [place] [address] [phone]

## 2017-09-07 ENCOUNTER — Ambulatory Visit
Admission: RE | Admit: 2017-09-07 | Discharge: 2017-09-07 | Disposition: A | Discharge status: Home / Self Care | Payer: Medicaid Other | Source: Ambulatory Visit | Attending: Radiation Oncology | Admitting: Radiation Oncology

## 2017-09-07 DIAGNOSIS — Z510 Encounter for antineoplastic radiation therapy: Secondary | ICD-10-CM | POA: Diagnosis not present

## 2017-09-08 ENCOUNTER — Ambulatory Visit
Admission: RE | Admit: 2017-09-08 | Discharge: 2017-09-08 | Disposition: A | Discharge status: Home / Self Care | Payer: Medicaid Other | Source: Ambulatory Visit | Attending: Radiation Oncology | Admitting: Radiation Oncology

## 2017-09-08 ENCOUNTER — Telehealth: Payer: Self-pay | Admitting: Hematology and Oncology

## 2017-09-08 DIAGNOSIS — Z510 Encounter for antineoplastic radiation therapy: Secondary | ICD-10-CM | POA: Diagnosis not present

## 2017-09-08 NOTE — Telephone Encounter (Signed)
Left message - scheduled appt per 9/26 sch message - left  message with appt time and date.

## 2017-09-09 ENCOUNTER — Ambulatory Visit
Admission: RE | Admit: 2017-09-09 | Discharge: 2017-09-09 | Disposition: A | Discharge status: Home / Self Care | Payer: Medicaid Other | Source: Ambulatory Visit | Attending: Radiation Oncology | Admitting: Radiation Oncology

## 2017-09-09 DIAGNOSIS — Z510 Encounter for antineoplastic radiation therapy: Secondary | ICD-10-CM | POA: Diagnosis not present

## 2017-09-10 ENCOUNTER — Ambulatory Visit
Admission: RE | Admit: 2017-09-10 | Discharge: 2017-09-10 | Disposition: A | Discharge status: Home / Self Care | Payer: Medicaid Other | Source: Ambulatory Visit | Attending: Radiation Oncology | Admitting: Radiation Oncology

## 2017-09-10 DIAGNOSIS — Z510 Encounter for antineoplastic radiation therapy: Secondary | ICD-10-CM | POA: Diagnosis not present

## 2017-09-13 ENCOUNTER — Ambulatory Visit
Admission: RE | Admit: 2017-09-13 | Discharge: 2017-09-13 | Disposition: A | Discharge status: Home / Self Care | Payer: Medicaid Other | Source: Ambulatory Visit | Attending: Radiation Oncology | Admitting: Radiation Oncology

## 2017-09-13 DIAGNOSIS — Z510 Encounter for antineoplastic radiation therapy: Secondary | ICD-10-CM | POA: Diagnosis not present

## 2017-09-14 ENCOUNTER — Ambulatory Visit: Payer: Medicaid Other

## 2017-09-14 ENCOUNTER — Telehealth: Payer: Self-pay | Admitting: Radiation Oncology

## 2017-09-14 ENCOUNTER — Ambulatory Visit
Admission: RE | Admit: 2017-09-14 | Discharge: 2017-09-14 | Disposition: A | Discharge status: Home / Self Care | Payer: Medicaid Other | Source: Ambulatory Visit | Attending: Radiation Oncology | Admitting: Radiation Oncology

## 2017-09-14 DIAGNOSIS — Z510 Encounter for antineoplastic radiation therapy: Secondary | ICD-10-CM | POA: Diagnosis not present

## 2017-09-14 NOTE — Telephone Encounter (Signed)
During PUT visit patient verbalized he was unable to pick up his nebulizer solution. Phoned [facility] to inquire. Understand from pharmacy staff the solution was picked up on 09/04/17. Staff member went onto say the patient paid $3 out of pocket for his script. Will confirm this with patient to ensure he has the medication he needs.

## 2017-09-15 ENCOUNTER — Encounter: Payer: Self-pay | Admitting: Family Medicine

## 2017-09-15 ENCOUNTER — Ambulatory Visit (INDEPENDENT_AMBULATORY_CARE_PROVIDER_SITE_OTHER): Payer: Medicaid Other | Admitting: Family Medicine

## 2017-09-15 ENCOUNTER — Ambulatory Visit: Payer: Medicaid Other

## 2017-09-15 ENCOUNTER — Ambulatory Visit
Admission: RE | Admit: 2017-09-15 | Discharge: 2017-09-15 | Disposition: A | Discharge status: Home / Self Care | Payer: Medicaid Other | Source: Ambulatory Visit | Attending: Radiation Oncology | Admitting: Radiation Oncology

## 2017-09-15 VITALS — BP 110/78 | HR 100 | Temp 98.5°F | Resp 12 | Ht 65.0 in | Wt 146.0 lb

## 2017-09-15 DIAGNOSIS — M7989 Other specified soft tissue disorders: Secondary | ICD-10-CM | POA: Diagnosis not present

## 2017-09-15 DIAGNOSIS — C3490 Malignant neoplasm of unspecified part of unspecified bronchus or lung: Secondary | ICD-10-CM

## 2017-09-15 DIAGNOSIS — R0902 Hypoxemia: Secondary | ICD-10-CM | POA: Diagnosis not present

## 2017-09-15 DIAGNOSIS — Z510 Encounter for antineoplastic radiation therapy: Secondary | ICD-10-CM | POA: Diagnosis not present

## 2017-09-15 MED ORDER — SUCRALFATE 1 GM/10ML PO SUSP: 1.0000 g | Freq: Three times a day (TID) | ORAL | 0 refills | Status: AC

## 2017-09-15 MED ORDER — SUCRALFATE 1 G PO TABS: 1.0000 g | ORAL_TABLET | Freq: Three times a day (TID) | ORAL | 1 refills | Status: DC

## 2017-09-15 MED ORDER — SENNOSIDES-DOCUSATE SODIUM 8.6-50 MG PO TABS: 1.0000 | ORAL_TABLET | Freq: Every day | ORAL | 0 refills | Status: AC

## 2017-09-15 MED FILL — CARAFATE 1 GM/10 ML SUSP: 1 | 14 days supply | Qty: 420 | Fill #0

## 2017-09-15 NOTE — Progress Notes (Signed)
Patient ID: Levi Brown, male    DOB: [birth year], 61 y.o.   MRN: [id number]  PCP: [name], FNP  Chief Complaint  Patient presents with  . Follow-up    2 WEEKS    Subjective:  HPI Levi Brown is a 61 y.o. male , with non-small cell cancer left lung, presents for evaluation 2 week follow-up. Levi Brown was recently hospitalized on 08/25/2017 for acute respiratory failure secondary. He also has persistent lower extremity leg and foot swelling for over the last 4 weeks which was resistant to treatment with furosemide. He is currently on continuous oxygen via nasal cannula 2 liters. He reports improvement of shortness of breath and resolution of lower extremity swelling. He wears compression socks daily. He presents today for follow-up labs post-hospitalization. Social History   Social History  . Marital status: Single    Spouse name: N/A  . Number of children: N/A  . Years of education: N/A   Occupational History  . Not on file.   Social History Main Topics  . Smoking status: Former Smoker    Packs/day: 1.00    Years: 30.00    Types: Cigarettes    Quit date: 2017  . Smokeless tobacco: Never Used     Comment: quit smoking in 2017  . Alcohol use 8.4 - 12.6 oz/week    14 - 21 Cans of beer per week  . Drug use: No  . Sexual activity: No   Other Topics Concern  . Not on file   Social History Narrative  . No narrative on file    Family History  Problem Relation Age of Onset  . Asthma Mother   . COPD Mother   . Cancer Mother        breast?   Review of Systems  See history of present illness  Patient Active Problem List   Diagnosis Date Noted  . Edema 08/31/2017  . Hypokalemia 08/29/2017  . Acute respiratory failure with hypoxia and hypercapnia ([place]) 08/26/2017  . Pleural effusion on left 08/26/2017  . Collapse of left lung 08/26/2017  . Hyponatremia 08/26/2017  . COPD exacerbation ([place]) 08/26/2017  . Obstructive pneumonia 08/26/2017  . Non-small  cell cancer of left lung ([place])   . Non-small cell cancer of left lung ([place]) 07/13/2017  . Elevated blood pressure reading 07/05/2017    No Known Allergies  Prior to Admission medications   Medication Sig Start Date End Date Taking? Authorizing Provider  acetaminophen (TYLENOL) 325 MG tablet Take 325 mg by mouth every 6 (six) hours as needed for moderate pain.   Yes [provider]  albuterol (PROVENTIL HFA;VENTOLIN HFA) 108 (90 Base) MCG/ACT inhaler Inhale 2 puffs into the lungs every 4 (four) hours as needed for wheezing or shortness of breath (cough, shortness of breath or wheezing.). 08/17/17  Yes Jegede, Olugbemiga E, MD  amLODipine (NORVASC) 5 MG tablet Take 1 tablet (5 mg total) by mouth daily. 09/06/17  Yes [name], FNP  aspirin EC 81 MG tablet Take 1 tablet (81 mg total) by mouth daily. 07/08/17 07/08/18 Yes Florencia Reasons, MD  Dextromethorphan Polistirex (COUGH DM PO) Take 30 mLs by mouth daily as needed (cough).   Yes [provider]  feeding supplement (BOOST / RESOURCE BREEZE) LIQD Take 1 Container by mouth 3 (three) times daily between meals. 09/04/17  Yes Florencia Reasons, MD  fluticasone furoate-vilanterol (BREO ELLIPTA) 100-25 MCG/INH AEPB Inhale 1 puff into the lungs daily. 08/31/17  Yes Jani Gravel,  MD  furosemide (LASIX) 20 MG tablet Take 1 tablet (20 mg total) by mouth daily as needed. Patient taking differently: Take 20 mg by mouth daily as needed for fluid or edema.  08/23/17  Yes [name], FNP  ipratropium-albuterol (DUONEB) 0.5-2.5 (3) MG/3ML SOLN Take 3 mLs by nebulization every 4 (four) hours as needed. 08/31/17  Yes Jani Gravel, MD  metoprolol tartrate (LOPRESSOR) 25 MG tablet Take 0.5 tablets (12.5 mg total) by mouth 2 (two) times daily. 09/06/17  Yes [name], FNP  Multiple Vitamins-Minerals (DRY EYE FORMULA PO) Place 1 drop into both eyes daily.   Yes [provider]  ondansetron (ZOFRAN ODT) 4 MG disintegrating tablet Take 1 tablet  (4 mg total) by mouth every 8 (eight) hours as needed for nausea or vomiting. 08/23/17  Yes [name], FNP  pantoprazole (PROTONIX) 40 MG tablet Take 1 tablet (40 mg total) by mouth at bedtime. 09/06/17  Yes [name], FNP  potassium chloride SA (K-DUR,KLOR-CON) 20 MEQ tablet Take 1 tablet (20 mEq total) by mouth daily. 08/17/17  Yes [name], FNP  sucralfate (CARAFATE) 1 g tablet Take 1 tablet (1 g total) by mouth 4 (four) times daily -  with meals and at bedtime. Dissolve tablet in 15 cc of water and drink 15 minutes before meals and at bedtime. 08/18/17  Yes Hayden Pedro, PA-C  traZODone (DESYREL) 50 MG tablet Take 0.5-1 tablets (25-50 mg total) by mouth at bedtime as needed for sleep. 08/23/17  Yes [name], FNP  Wound Cleansers (RADIAPLEX EX) Apply topically.   Yes [provider]  furosemide (LASIX) 40 MG tablet Take 1 tablet (40 mg total) by mouth daily. Patient not taking: Reported on 09/06/2017 09/04/17 09/11/17  Florencia Reasons, MD    Past Medical, Surgical Family and Social History reviewed and updated.    Objective:   Today's Vitals   09/15/17 1127  BP: 110/78  Pulse: 100  Resp: 12  Temp: 98.5 F (36.9 C)  TempSrc: Oral  SpO2: 99%  Weight: 146 lb (66.2 kg)  Height: 5\' 5"  (1.651 m)    Wt Readings from Last 3 Encounters:  09/15/17 146 lb (66.2 kg)  09/04/17 152 lb 14.4 oz (69.4 kg)  09/04/17 152 lb 14.7 oz (69.4 kg)   Physical Exam  Constitutional: He is oriented to person, place, and time. He appears well-developed and well-nourished.  HENT:  Head: Normocephalic and atraumatic.  Eyes: Pupils are equal, round, and reactive to light. Conjunctivae and EOM are normal.  Cardiovascular: Normal rate, regular rhythm, normal heart sounds and intact distal pulses.   Pulmonary/Chest: He has decreased breath sounds in the right upper field, the right middle field, the right lower field, the left upper field, the left middle field and the  left lower field. He has wheezes in the right upper field, the right middle field, the left upper field and the left middle field.  Musculoskeletal: Normal range of motion.  Lymphadenopathy:    He has no cervical adenopathy.  Neurological: He is alert and oriented to person, place, and time.  Skin: Skin is warm and dry.  Psychiatric: He has a normal mood and affect. His behavior is normal. Judgment and thought content normal.   Assessment & Plan:  1. Non-small cell lung cancer, unspecified laterality [facility]), continue oncology treatment.  2. Hypoxia, continue home oxygen.  3. Swelling of lower leg, resolved. Continue furosemide as needed.  Orders Placed This Encounter  Procedures  .  CBC with Differential  . Basic metabolic panel    RTC: 1 month routine follow-up.    Carroll Sage. Kenton Kingfisher, MSN, FNP-C [facility]  [address] [place], [place] [address] [phone]

## 2017-09-16 ENCOUNTER — Ambulatory Visit
Admission: RE | Admit: 2017-09-16 | Discharge: 2017-09-16 | Disposition: A | Discharge status: Home / Self Care | Payer: Medicaid Other | Source: Ambulatory Visit | Attending: Radiation Oncology | Admitting: Radiation Oncology

## 2017-09-16 ENCOUNTER — Ambulatory Visit: Payer: Medicaid Other

## 2017-09-16 DIAGNOSIS — Z510 Encounter for antineoplastic radiation therapy: Secondary | ICD-10-CM | POA: Diagnosis not present

## 2017-09-16 LAB — BASIC METABOLIC PANEL
BUN/Creatinine Ratio: 7 (calc) (ref 6–22)
BUN: 4 mg/dL — ABNORMAL LOW (ref 7–25)
CALCIUM: 8.9 mg/dL (ref 8.6–10.3)
CHLORIDE: 85 mmol/L — AB (ref 98–110)
CO2: 36 mmol/L — AB (ref 20–32)
Creat: 0.56 mg/dL — ABNORMAL LOW (ref 0.70–1.33)
Glucose, Bld: 105 mg/dL — ABNORMAL HIGH (ref 65–99)
POTASSIUM: 4.1 mmol/L (ref 3.5–5.3)
SODIUM: 136 mmol/L (ref 135–146)

## 2017-09-16 LAB — CBC WITH DIFFERENTIAL/PLATELET
BASOS ABS: 34 {cells}/uL (ref 0–200)
Basophils Relative: 0.3 %
EOS ABS: 2884 {cells}/uL — AB (ref 15–500)
EOS PCT: 25.3 %
HEMATOCRIT: 24.9 % — AB (ref 38.5–50.0)
Hemoglobin: 7.7 g/dL — ABNORMAL LOW (ref 13.2–17.1)
Lymphs Abs: 194 cells/uL — ABNORMAL LOW (ref 850–3900)
MCH: 27.5 pg (ref 27.0–33.0)
MCHC: 30.9 g/dL — AB (ref 32.0–36.0)
MCV: 88.9 fL (ref 80.0–100.0)
MONOS PCT: 7 %
MPV: 9.1 fL (ref 7.5–12.5)
NEUTROS PCT: 65.7 %
Neutro Abs: 7490 cells/uL (ref 1500–7800)
Platelets: 696 10*3/uL — ABNORMAL HIGH (ref 140–400)
RBC: 2.8 10*6/uL — ABNORMAL LOW (ref 4.20–5.80)
RDW: 15.8 % — AB (ref 11.0–15.0)
Total Lymphocyte: 1.7 %
WBC mixed population: 798 cells/uL (ref 200–950)
WBC: 11.4 10*3/uL — ABNORMAL HIGH (ref 3.8–10.8)

## 2017-09-17 ENCOUNTER — Telehealth: Payer: Self-pay | Admitting: Internal Medicine

## 2017-09-17 ENCOUNTER — Encounter: Payer: Self-pay | Admitting: Radiation Oncology

## 2017-09-17 ENCOUNTER — Ambulatory Visit
Admission: RE | Admit: 2017-09-17 | Discharge: 2017-09-17 | Disposition: A | Discharge status: Home / Self Care | Payer: Medicaid Other | Source: Ambulatory Visit | Attending: Radiation Oncology | Admitting: Radiation Oncology

## 2017-09-17 DIAGNOSIS — Z510 Encounter for antineoplastic radiation therapy: Secondary | ICD-10-CM | POA: Diagnosis not present

## 2017-09-17 NOTE — Telephone Encounter (Signed)
Moved from MP 10/11 to MM 10/10 due to insurance. Spoke with patient he is aware of new date/time/provider.

## 2017-09-17 NOTE — Progress Notes (Signed)
[facility] Therapy Treatment Record Phone [phone]   Radiation Therapy was administered to Levi Brown on: 09/17/2017  10:44 AM and was treatment # 33 out of a planned course of 33 treatments.  Radiation Treatment  1). Beam photons with 6-10 energy  2). Brachytherapy None  3). Stereotactic Radiosurgery None  4). Other Radiation None     Jacques Earthly, RT (T)

## 2017-09-20 ENCOUNTER — Other Ambulatory Visit (HOSPITAL_COMMUNITY)
Admission: RE | Admit: 2017-09-20 | Discharge: 2017-09-20 | Disposition: A | Discharge status: Home / Self Care | Payer: Medicaid Other | Source: Ambulatory Visit | Attending: Internal Medicine | Admitting: Internal Medicine

## 2017-09-20 ENCOUNTER — Encounter: Payer: Self-pay | Admitting: *Deleted

## 2017-09-20 ENCOUNTER — Telehealth: Payer: Self-pay

## 2017-09-20 DIAGNOSIS — C3492 Malignant neoplasm of unspecified part of left bronchus or lung: Secondary | ICD-10-CM | POA: Insufficient documentation

## 2017-09-20 NOTE — Progress Notes (Signed)
Oncology Nurse Navigator Documentation  Oncology Nurse Navigator Flowsheets 09/20/2017  Navigator Location [facility]  Navigator Encounter Type Other/I updated Dr. Julien Nordmann on Levi Brown's appt with him on 10/10.  He requested I send cytology for PDL 1. I requested.   Barriers/Navigation Needs Coordination of Care  Interventions Coordination of Care  Coordination of Care Other  Acuity Level 2  Acuity Level 2 Other  Time Spent with Patient 30

## 2017-09-21 ENCOUNTER — Other Ambulatory Visit: Payer: Self-pay | Admitting: Medical Oncology

## 2017-09-21 ENCOUNTER — Other Ambulatory Visit: Payer: Self-pay

## 2017-09-21 DIAGNOSIS — C3412 Malignant neoplasm of upper lobe, left bronchus or lung: Secondary | ICD-10-CM

## 2017-09-21 NOTE — Telephone Encounter (Signed)
Patient given information to [facility] dentistry

## 2017-09-22 ENCOUNTER — Encounter: Payer: Self-pay | Admitting: Internal Medicine

## 2017-09-22 ENCOUNTER — Telehealth: Payer: Self-pay | Admitting: Family Medicine

## 2017-09-22 ENCOUNTER — Telehealth: Payer: Self-pay | Admitting: Internal Medicine

## 2017-09-22 ENCOUNTER — Ambulatory Visit (HOSPITAL_BASED_OUTPATIENT_CLINIC_OR_DEPARTMENT_OTHER): Payer: Medicaid Other | Admitting: Internal Medicine

## 2017-09-22 ENCOUNTER — Other Ambulatory Visit (HOSPITAL_BASED_OUTPATIENT_CLINIC_OR_DEPARTMENT_OTHER): Payer: Medicaid Other

## 2017-09-22 VITALS — BP 102/68 | HR 129 | Temp 98.2°F | Resp 16 | Ht 65.0 in | Wt 145.9 lb

## 2017-09-22 DIAGNOSIS — C3412 Malignant neoplasm of upper lobe, left bronchus or lung: Secondary | ICD-10-CM | POA: Diagnosis not present

## 2017-09-22 DIAGNOSIS — C3492 Malignant neoplasm of unspecified part of left bronchus or lung: Secondary | ICD-10-CM

## 2017-09-22 DIAGNOSIS — C7951 Secondary malignant neoplasm of bone: Secondary | ICD-10-CM | POA: Diagnosis not present

## 2017-09-22 DIAGNOSIS — R5382 Chronic fatigue, unspecified: Secondary | ICD-10-CM

## 2017-09-22 LAB — CBC WITH DIFFERENTIAL/PLATELET
BASO%: 0.6 % (ref 0.0–2.0)
Basophils Absolute: 0.1 10*3/uL (ref 0.0–0.1)
EOS%: 24.1 % — ABNORMAL HIGH (ref 0.0–7.0)
Eosinophils Absolute: 2.8 10*3/uL — ABNORMAL HIGH (ref 0.0–0.5)
HCT: 27.1 % — ABNORMAL LOW (ref 38.4–49.9)
HGB: 8.5 g/dL — ABNORMAL LOW (ref 13.0–17.1)
LYMPH%: 1.1 % — AB (ref 14.0–49.0)
MCH: 28 pg (ref 27.2–33.4)
MCHC: 31.3 g/dL — ABNORMAL LOW (ref 32.0–36.0)
MCV: 89.5 fL (ref 79.3–98.0)
MONO#: 0.7 10*3/uL (ref 0.1–0.9)
MONO%: 5.7 % (ref 0.0–14.0)
NEUT%: 68.5 % (ref 39.0–75.0)
NEUTROS ABS: 8 10*3/uL — AB (ref 1.5–6.5)
PLATELETS: 565 10*3/uL — AB (ref 140–400)
RBC: 3.03 10*6/uL — AB (ref 4.20–5.82)
RDW: 20.5 % — AB (ref 11.0–14.6)
WBC: 11.7 10*3/uL — ABNORMAL HIGH (ref 4.0–10.3)
lymph#: 0.1 10*3/uL — ABNORMAL LOW (ref 0.9–3.3)

## 2017-09-22 LAB — COMPREHENSIVE METABOLIC PANEL
ALK PHOS: 115 U/L (ref 40–150)
ALT: 33 U/L (ref 0–55)
ANION GAP: 8 meq/L (ref 3–11)
AST: 20 U/L (ref 5–34)
Albumin: 2.5 g/dL — ABNORMAL LOW (ref 3.5–5.0)
BILIRUBIN TOTAL: 0.3 mg/dL (ref 0.20–1.20)
BUN: 5.7 mg/dL — ABNORMAL LOW (ref 7.0–26.0)
CO2: 36 meq/L — AB (ref 22–29)
Calcium: 10 mg/dL (ref 8.4–10.4)
Chloride: 93 mEq/L — ABNORMAL LOW (ref 98–109)
Creatinine: 0.6 mg/dL — ABNORMAL LOW (ref 0.7–1.3)
Glucose: 128 mg/dl (ref 70–140)
Potassium: 4.1 mEq/L (ref 3.5–5.1)
Sodium: 137 mEq/L (ref 136–145)
TOTAL PROTEIN: 7.4 g/dL (ref 6.4–8.3)

## 2017-09-22 LAB — MAGNESIUM: MAGNESIUM: 1.8 mg/dL (ref 1.5–2.5)

## 2017-09-22 MED ORDER — PROCHLORPERAZINE MALEATE 10 MG PO TABS: 10.0000 mg | ORAL_TABLET | Freq: Four times a day (QID) | ORAL | 0 refills | Status: AC | PRN

## 2017-09-22 NOTE — Progress Notes (Signed)
  Radiation Oncology         [phone] ________________________________  Name: Levi Brown MRN: [id number]  Date: 09/17/2017  DOB: December 19, 1955   End of Treatment Note  Diagnosis:   61 yo man with stage cT4 cN2 cM0, pending PET, squamous cell carcinoma of the left upper lung.     Indication for treatment:  Palliative Radiotherapy       Radiation treatment dates:   07/29/2017 to 09/17/2017  Site/dose:   The primary tumor and involved mediastinal adenopathy were treated to 66 Gy in 33 fractions of 2 Gy.  Beams/energy:   A five field 3D conformal treatment arrangement was used delivering 6 and 10 MV photons.  Daily image-guidance CT was used to align the treatment with the targeted volume  Narrative: The patient tolerated radiation treatment relatively well.  He experienced some esophagitis characterized as pain and difficulty associated with swallowing which was managed with carafate.  The patient also noted significant fatigue and occasional shortness of breath even with continuous oxygen therapy and a productive cough with clear sputum.  He required a brief episode of hospitalization and admission to the ICU due to ARDS.  He recovered and was able to continue radiotherapy to completion thereafter.  Plan: The patient has completed radiation treatment. Patient was encouraged to continue use of Carafate for swallowing difficulty. The patient will return to radiation oncology clinic for routine followup in one month. I advised him to call or return sooner if he has any questions or concerns related to his recovery or treatment.  ________________________________  Sheral Apley. Tammi Klippel, M.D.  This document serves as a record of services personally performed by Tyler Pita, MD. It was created on his behalf by Arlyce Harman, a trained medical scribe. The creation of this record is based on the scribe's personal observations and the provider's statements to them. This document has been  checked and approved by the attending provider.

## 2017-09-22 NOTE — Telephone Encounter (Signed)
Gave avs and calendar for October - December

## 2017-09-22 NOTE — Progress Notes (Signed)
[place] [phone]   [phone]  OFFICE PROGRESS NOTE  [name], FNP [place] [address]  DIAGNOSIS: stage IV (T4, N3, M1c) non-small cell lung cancer, squamous cell carcinoma presented with left sided obstructing mediastinal mass with complete occlusion of the left upper lobe bronchus and postobstructive atelectasis of the left upper lobe as well as contralateral right paratracheal lymphadenopathy and hypermetabolic metastasis in the sternum as well as left pleural effusion diagnosed in July 2018  PRIOR THERAPY:status post a course of palliative radiotherapy to the large left mediastinal mass and the sternum under the care of Dr. Tammi Klippel.  CURRENT THERAPY: systemic chemotherapy was carboplatin for AUC of 5, paclitaxel 175 MG/M2 and Ketruda (pembrolizumab) 200 MG IV every 3 weeks. First dose 10/05/2017.  INTERVAL HISTORY: Levi Brown 61 y.o. male returns to the clinic today for evaluation and to establish care with me. The patient was diagnosed with a stage IV non-small cell lung cancer, squamous cell carcinoma in July 2018. He was seen by Dr. Lebron Conners initially for management of his condition but because of insurance coverage issues, the patient could not continue his care under Dr. Lebron Conners. He is here for reevaluation and management of his condition. The patient was treated with a palliative course of radiotherapy to the large obstructing left mediastinal and sternal lytic lesion under the care of Dr. Tammi Klippel. He completed this course of treatment last week. He has improvement in his breathing and pain management. He denied having any current chest pain but continues to have shortness breath with exertion and currently on home oxygen. He denied having any fever or chills. He has no nausea, vomiting, diarrhea or constipation.The patient denied having any headache or visual changes. MRI of the brain on 08/30/2017 showed no  evidence of metastatic disease to the brain. The patient denied having any other significant complaints today.   MEDICAL HISTORY: Past Medical History:  Diagnosis Date  . Former smoker    Quit smoking in early 2017  . Non-small cell cancer of left lung ([place]) 07/13/2017  . Stroke ([place]) 07/07/2017   [place]    ALLERGIES:  has No Known Allergies.  MEDICATIONS:  Current Outpatient Prescriptions  Medication Sig Dispense Refill  . albuterol (PROVENTIL HFA;VENTOLIN HFA) 108 (90 Base) MCG/ACT inhaler Inhale 2 puffs into the lungs every 4 (four) hours as needed for wheezing or shortness of breath (cough, shortness of breath or wheezing.). 54 g 3  . amLODipine (NORVASC) 5 MG tablet Take 1 tablet (5 mg total) by mouth daily. 90 tablet 1  . aspirin EC 81 MG tablet Take 1 tablet (81 mg total) by mouth daily. 30 tablet 0  . feeding supplement (BOOST / RESOURCE BREEZE) LIQD Take 1 Container by mouth 3 (three) times daily between meals. 30 Container 0  . fluticasone furoate-vilanterol (BREO ELLIPTA) 100-25 MCG/INH AEPB Inhale 1 puff into the lungs daily. 1 each 0  . furosemide (LASIX) 20 MG tablet Take 1 tablet (20 mg total) by mouth daily as needed. (Patient taking differently: Take 20 mg by mouth daily as needed for fluid or edema. ) 30 tablet 1  . ipratropium-albuterol (DUONEB) 0.5-2.5 (3) MG/3ML SOLN Take 3 mLs by nebulization every 4 (four) hours as needed. 360 mL 0  . metoprolol tartrate (LOPRESSOR) 25 MG tablet Take 0.5 tablets (12.5 mg total) by mouth 2 (two) times daily. 60 tablet 0  . Multiple Vitamins-Minerals (DRY EYE FORMULA PO) Place 1  drop into both eyes daily.    . pantoprazole (PROTONIX) 40 MG tablet Take 1 tablet (40 mg total) by mouth at bedtime. 30 tablet 3  . sucralfate (CARAFATE) 1 GM/10ML suspension Take 10 mLs (1 g total) by mouth 3 (three) times daily with meals. 420 mL 0  . traZODone (DESYREL) 50 MG tablet Take 0.5-1 tablets (25-50 mg total) by mouth at bedtime as needed for  sleep. 60 tablet 3  . Wound Cleansers (RADIAPLEX EX) Apply topically.    Marland Kitchen acetaminophen (TYLENOL) 325 MG tablet Take 325 mg by mouth every 6 (six) hours as needed for moderate pain.    Marland Kitchen Dextromethorphan Polistirex (COUGH DM PO) Take 30 mLs by mouth daily as needed (cough).    . ondansetron (ZOFRAN ODT) 4 MG disintegrating tablet Take 1 tablet (4 mg total) by mouth every 8 (eight) hours as needed for nausea or vomiting. (Patient not taking: Reported on 09/22/2017) 30 tablet 0  . potassium chloride SA (K-DUR,KLOR-CON) 20 MEQ tablet Take 1 tablet (20 mEq total) by mouth daily. 5 tablet 0  . senna-docusate (SENOKOT-S) 8.6-50 MG tablet Take 1-2 tablets by mouth at bedtime. 60 tablet 0   No current facility-administered medications for this visit.     SURGICAL HISTORY:  Past Surgical History:  Procedure Laterality Date  . VIDEO BRONCHOSCOPY Bilateral 07/07/2017   Procedure: VIDEO BRONCHOSCOPY WITHOUT FLUORO;  Surgeon: Collene Gobble, MD;  Location: [facility] ENDOSCOPY;  Service: Cardiopulmonary;  Laterality: Bilateral;    REVIEW OF SYSTEMS:  Constitutional: positive for fatigue Eyes: negative Ears, nose, mouth, throat, and face: negative Respiratory: positive for cough and dyspnea on exertion Cardiovascular: negative Gastrointestinal: negative Genitourinary:negative Integument/breast: negative Hematologic/lymphatic: negative Musculoskeletal:negative Neurological: negative Behavioral/Psych: negative Endocrine: negative Allergic/Immunologic: negative   PHYSICAL EXAMINATION: General appearance: alert, cooperative, fatigued and no distress Head: Normocephalic, without obvious abnormality, atraumatic Neck: no adenopathy, no JVD, supple, symmetrical, trachea midline and thyroid not enlarged, symmetric, no tenderness/mass/nodules Lymph nodes: Cervical, supraclavicular, and axillary nodes normal. Resp: clear to auscultation bilaterally Back: symmetric, no curvature. ROM normal. No CVA  tenderness. Cardio: regular rate and rhythm, S1, S2 normal, no murmur, click, rub or gallop GI: soft, non-tender; bowel sounds normal; no masses,  no organomegaly Extremities: extremities normal, atraumatic, no cyanosis or edema Neurologic: Alert and oriented X 3, normal strength and tone. Normal symmetric reflexes. Normal coordination and gait  ECOG PERFORMANCE STATUS: 1 - Symptomatic but completely ambulatory  Blood pressure 102/68, pulse (!) 129, temperature 98.2 F (36.8 C), temperature source Oral, resp. rate 16, height 5\' 5"  (1.651 m), weight 145 lb 14.4 oz (66.2 kg), SpO2 100 %.  LABORATORY DATA: Lab Results  Component Value Date   WBC 11.7 (H) 09/22/2017   HGB 8.5 (L) 09/22/2017   HCT 27.1 (L) 09/22/2017   MCV 89.5 09/22/2017   PLT 565 (H) 09/22/2017      Chemistry      Component Value Date/Time   NA 136 09/15/2017 1151   NA 131 (L) 07/30/2017 1510   K 4.1 09/15/2017 1151   K 3.8 07/30/2017 1510   CL 85 (L) 09/15/2017 1151   CO2 36 (H) 09/15/2017 1151   CO2 33 (H) 07/30/2017 1510   BUN 4 (L) 09/15/2017 1151   BUN 8.7 07/30/2017 1510   CREATININE 0.56 (L) 09/15/2017 1151   CREATININE 0.8 07/30/2017 1510      Component Value Date/Time   CALCIUM 8.9 09/15/2017 1151   CALCIUM 9.4 07/30/2017 1510   ALKPHOS 100 08/31/2017 0644  ALKPHOS 241 (H) 07/30/2017 1510   AST 14 (L) 08/31/2017 0644   AST 55 (H) 07/30/2017 1510   ALT 37 08/31/2017 0644   ALT 93 (H) 07/30/2017 1510   BILITOT 0.5 08/31/2017 0644   BILITOT 1.05 07/30/2017 1510       RADIOGRAPHIC STUDIES: Dg Chest 2 View  Result Date: 09/01/2017 CLINICAL DATA:  Pleural effusion EXAM: CHEST  2 VIEW COMPARISON:  08/29/2017 FINDINGS: Moderate to large left pleural effusion is similar. There is associated left central lung collapse/consolidation. Tiny right pleural effusion again noted. Right-sided aortic arch again noted. The visualized bony structures of the thorax are intact. Telemetry leads overlie the chest.  IMPRESSION: 1. No substantial interval change. Moderate to large left pleural effusion with left lung collapse/ consolidation. 2. Tiny right pleural effusion 3. Right aortic arch. Electronically Signed   By: Misty Stanley M.D.   On: 09/01/2017 12:39   Dg Chest 2 View  Result Date: 08/29/2017 CLINICAL DATA:  Shortness of breath. History of lung cancer. Pleural effusion. EXAM: CHEST  2 VIEW COMPARISON:  08/27/2017 FINDINGS: The patient has a known large left hilar mass invading the mediastinum and a drowned lung appearance of the left upper lobe on CT scan from 4 days ago. There is also previously a significant left pleural effusion. Volume loss is again observed in the left hemithorax with elevation of left hemidiaphragm. Left pleural effusion again noted. The amount of aerated left lung, while small, is slightly improved from the 08/27/2017 exam. Right-sided aortic arch. Subtle blunting of the right costophrenic angle. IMPRESSION: 1. Minimally improved aeration in the left lung although only a small portion of the lung is aerated. Opacification in much of the left hemithorax due to known mass common drowned lung, and large left pleural effusion. 2. Trace right pleural effusion. 3. Right-sided aortic arch noted. Electronically Signed   By: Van Clines M.D.   On: 08/29/2017 12:17   Ct Angio Chest Pe W Or Wo Contrast  Addendum Date: 09/03/2017   ADDENDUM REPORT: 09/03/2017 18:36 ADDENDUM: Stable lytic metastasis in the sternum, hypermetabolic on recent PET-CT. Electronically Signed   By: Claudie Revering M.D.   On: 09/03/2017 18:36   Result Date: 09/03/2017 CLINICAL DATA:  Progressive shortness of breath. Acute respiratory failure. Undergoing radiation therapy for stage IV non-small cell lung carcinoma. EXAM: CT ANGIOGRAPHY CHEST WITH CONTRAST TECHNIQUE: Multidetector CT imaging of the chest was performed using the standard protocol during bolus administration of intravenous contrast. Multiplanar CT image  reconstructions and MIPs were obtained to evaluate the vascular anatomy. CONTRAST:  100 cc Isovue 370 COMPARISON:  08/25/2017. FINDINGS: Cardiovascular: The heart remains borderline enlarged. A right-sided aortic arch and descending thoracic aorta are again demonstrated with a retroesophageal left subclavian artery. The distal margin proximal descending aorta remain dilated, with a maximum diameter of 4.0 cm on sagittal image number 124 of series 10. Normally opacified pulmonary arteries with no pulmonary arterial filling defects. The left main pulmonary artery and its immediate branches are compressed by an encasing mass without occlusion. Mediastinum/Nodes: Mediastinal shift to the right by the previously demonstrated large left lung mass, invading the hilum and mediastinum. The shift has increased. No enlarged lymph nodes separate from the mass. Unremarkable thyroid gland. Lungs/Pleura: The large left lung and mediastinal mass measures 10.3 x 7.3 cm on image number 41 of series 4, previously 10.8 x 7.9 cm in corresponding dimensions. There is slightly less compression of the left mainstem bronchus with patency of the left upper  lobe bronchus currently demonstrated. No significant change in a moderate-sized left pleural effusion and compressive atelectasis of the left lower lobe. No lung nodules seen on the right. Upper Abdomen: Mild left adrenal hyperplasia is again demonstrated no mass seen. Musculoskeletal: 2.8 cm oval lytic lesion in the sternum on the left. Review of the MIP images confirms the above findings. IMPRESSION: 1. No pulmonary emboli seen. 2. Slight interval decrease in size of the large left upper lobe mass invading the mediastinum with vascular and bronchial encasement. 3. Continued compression of the left main pulmonary artery and marked compression of the left upper lobe pulmonary artery due to the mass. 4. The left mainstem bronchus and left upper lobe bronchus are currently patent, although  somewhat compressed. 5. Stable moderate-sized left pleural effusion and compressive atelectasis of the left lower lobe. 6. Slightly progressive mediastinal shift to the right. 7. 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 Electronically Signed: By: Claudie Revering M.D. On: 09/03/2017 18:06   Ct Angio Chest Pe W Or Wo Contrast  Result Date: 08/25/2017 CLINICAL DATA:  Dyspnea, orthostasis, confusion and tachycardia. Hypoxia with pulse oximeter 67%. Patient canceled radiation therapy today due to progressive weakness. Has not started chemotherapy. EXAM: CT ANGIOGRAPHY CHEST WITH CONTRAST TECHNIQUE: Multidetector CT imaging of the chest was performed using the standard protocol during bolus administration of intravenous contrast. Multiplanar CT image reconstructions and MIPs were obtained to evaluate the vascular anatomy. CONTRAST:  100 cc Isovue 370 IV COMPARISON:  None. FINDINGS: Cardiovascular: Right-sided aortic arch with aberrant left subclavian artery. No acute pulmonary embolus. Marked narrowing of the left main pulmonary artery secondary to previously described mediastinal soft tissue mass. No aortic aneurysm or dissection. Heart size is normal without pericardial effusion. Mediastinum/Nodes: The previously described soft tissue mass within the superior mediastinal causing mass effect on the left main pulmonary artery, obliterating the left upper lobe bronchus and impinging upon the left upper lobe bronchus is less distinct due to adjacent new left upper lobe atelectasis likely secondary to postobstructive involvement of the left upper lobe bronchus Lungs/Pleura: New moderate left effusion with new left lower lobe compressive atelectasis. Postobstructive collapse of the left upper lobe is more expensive since prior exam with only a scant amount  of aerated lingula remaining. The contralateral right lung remains aerated without pneumonic consolidation or effusion. No pneumothorax. Upper Abdomen: No acute abnormality. Musculoskeletal: No chest wall abnormality. No acute or significant osseous findings. Review of the MIP images confirms the above findings. IMPRESSION: 1. New moderate left-sided pleural effusion with left lower lobe compressive atelectasis. Only a scant amount of aerated lung remains within the left hemithorax. 2. Further progression of left upper lobe collapse likely postobstructive in etiology secondary to previously described mediastinal soft tissue mass. Where the margins of this mass ends and the beginning of atelectatic lung begins is difficult to determine due to similar soft tissue attenuation of these abutting abnormalities. This mass is again noted to obstruct the left upper lobe bronchus, markedly narrowing the left main pulmonary artery and partially narrow the left lower lobe bronchus. 3. Right-sided aortic arch with aberrant left subclavian artery. Electronically Signed   By: Ashley Royalty M.D.   On: 08/25/2017 21:37   Mr Jeri Cos ZO Contrast  Result Date: 08/30/2017 CLINICAL DATA:  Metastatic lung cancer.  Progressive dizziness. EXAM: MRI HEAD WITHOUT AND WITH CONTRAST TECHNIQUE: Multiplanar,  multiecho pulse sequences of the brain and surrounding structures were obtained without and with intravenous contrast. CONTRAST:  20 cc MultiHance COMPARISON:  07/07/2017 FINDINGS: Brain: Diffusion imaging does not show any acute or subacute infarction. The brainstem and cerebellum are normal except for mild chronic small-vessel change of the pons. 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 No large vessel territory infarction. No mass lesion, hemorrhage, hydrocephalus or extra-axial collection.  Incidental venous angioma right frontal lobe. Vascular: Major vessels at the base of the brain show flow. Skull and upper cervical spine: Negative Sinuses/Orbits: Clear/normal Other: None IMPRESSION: No acute finding.  No evidence of metastatic disease. 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 Electronically Signed   By: Nelson Chimes M.D.   On: 08/30/2017 07:28   [place] Chest (pleural Effusion)  Result Date: 09/04/2017 CLINICAL DATA:  Evaluate pleural effusion. EXAM: CHEST ULTRASOUND COMPARISON:  Chest CT September 03, 2017 FINDINGS: There is a small left pleural effusion. IMPRESSION: A small left pleural effusion remains. Electronically Signed   By: Dorise Bullion III M.D   On: 09/04/2017 09:57   [place] Chest (pleural Effusion)  Result Date: 09/03/2017 CLINICAL DATA:  Evaluate left pleural effusion for possible thoracentesis. EXAM: CHEST ULTRASOUND COMPARISON:  Chest x-ray 09/01/2017 FINDINGS: Exam demonstrates a small amount of left pleural fluid. IMPRESSION: Small left pleural effusion not sufficient enough to indicate thoracentesis. Electronically Signed   By: Marin Olp M.D.   On: 09/03/2017 15:43   Dg Chest Port 1 View  Result Date: 08/27/2017 CLINICAL DATA:  Acute respiratory failure with hypoxia. History of left lung cancer. EXAM: PORTABLE CHEST 1 VIEW COMPARISON:  08/26/2017 FINDINGS: Exam demonstrates stable postsurgical change with volume loss of the left lung with elevation of the left hemidiaphragm. There is worsening opacification over the mid to lower left lung likely worsening effusion with atelectasis. Cannot exclude infection in the mid to lower left lung. Right lung is clear. Cardiomediastinal silhouette and remainder of the exam is unchanged. IMPRESSION: Interval worsening of small left pleural effusion likely with associated atelectasis. Cannot exclude infection in the left base. Stable postsurgical change/ volume loss of the left lung. Electronically Signed   By:  Marin Olp M.D.   On: 08/27/2017 07:08   Dg Chest Port 1 View  Result Date: 08/26/2017 CLINICAL DATA:  Status post left side thoracentesis. 500cc removed. Hx of left lung mass. EXAM: PORTABLE CHEST 1 VIEW COMPARISON:  08/25/2017 FINDINGS: There is persistent elevation of left hemidiaphragm. No pneumothorax. Mediastinal mass again identified. The right lung is clear. No pulmonary edema. IMPRESSION: No pneumothorax following thoracentesis. Electronically Signed   By: Nolon Nations M.D.   On: 08/26/2017 11:31   Dg Chest Portable 1 View  Result Date: 08/25/2017 CLINICAL DATA:  Intubation and NG tube placement. EXAM: PORTABLE CHEST 1 VIEW COMPARISON:  Radiographs and chest CT earlier this day. FINDINGS: Endotracheal tube tip 5.2 cm from the carina. Enteric tube in place, and tip below the diaphragm not included in the field of view. Again seen volume loss in the left hemithorax with left pleural effusion. Right paratracheal density corresponds to right aortic arch. Right lung is clear. IMPRESSION: 1. Endotracheal tube tip 5.2 cm from the carina. Enteric tube in place, tip below the diaphragm not included in the field of view. 2. Unchanged left lung volume loss and left pleural effusion. Mediastinal mass evaluated on chest  CT earlier this day. Electronically Signed   By: Jeb Levering M.D.   On: 08/25/2017 22:32   Dg Chest Port 1 View  Result Date: 08/25/2017 CLINICAL DATA:  Shortness of breath. History of LEFT lung cancer, RIGHT-sided aortic arch with vascular loop. EXAM: PORTABLE CHEST 1 VIEW COMPARISON:  PET-CT July 29, 2017 FINDINGS: Elevated LEFT hemidiaphragm, LEFT lung volume loss with perihilar consolidation with air bronchograms. Small LEFT pleural effusion. RIGHT lung is clear. The cardiac silhouette is mildly enlarged, limited assessment due to LEFT process. No pneumothorax. Soft tissue planes and included osseous structures are nonsuspicious, known osseous metastasis not apparent by  plain radiography. IMPRESSION: Worsening LEFT lung atelectasis and consolidation/pneumonia, with underlying known LEFT lung mass. Small LEFT pleural effusion. Electronically Signed   By: Elon Alas M.D.   On: 08/25/2017 19:08    ASSESSMENT AND PLAN: this is a very pleasant 61 years old African-American male with stage IV (T4, N3, M1c) non-small cell lung cancer, squamous cell carcinoma presented with left sided obstructing mediastinal mass with complete occlusion of the left upper lobe bronchus and postobstructive atelectasis of the left upper lobe as well as contralateral right paratracheal lymphadenopathy and hypermetabolic metastasis in the sternum as well as left pleural effusion diagnosed in July 2018 He underwent a course of palliative radiotherapy to the obstructive left mediastinal mass as well as the sternum completed last week. I had a lengthy discussion with the patient today about his current disease stage, prognosis and treatment options. The patient understands that he has incurable condition and also treatment will be of palliative nature. I gave the patient the option of palliative care and hospice referral versus consideration of palliative systemic chemotherapy was carboplatin for AUC of 5, paclitaxel 175 MG/M2 in addition to Ketruda 200 MG IV every 3 weeks. The patient is interested in proceeding with systemic therapy. I discussed with him the adverse effect of this treatment including but not limited to alopecia, myelosuppression, nausea and vomiting, peripheral neuropathy, liver or renal dysfunction as well as the adverse effect of the immunotherapy including immunotherapy mediated skin rash, diarrhea, inflammation of the lung, kidney, liver or thyroid or other endocrine dysfunction including type 1 diabetes mellitus. He is expected to start the first dose of this treatment on 10/05/2017. I will arrange for the patient to have a chemotherapy education class before the first dose  of his treatment. I will call his pharmacy with prescription for Compazine 10 mg by mouth every 6 hours as needed for nausea. I also advised the patient to take over-the-counter oral iron tablets 2 tablets by mouth daily for his persistent iron deficiency anemia. The patient would come back for follow-up visit with the start of the first cycle of his treatment. He was advised to call immediately if he has any concerning symptoms in the interval. The patient voices understanding of current disease status and treatment options and is in agreement with the current care plan.  All questions were answered. The patient knows to call the clinic with any problems, questions or concerns. We can certainly see the patient much sooner if necessary.  I spent 30 minutes counseling the patient face to face. The total time spent in the appointment was 40 minutes.  Disclaimer: This note was dictated with voice recognition software. Similar sounding words can inadvertently be transcribed and may not be corrected upon review.

## 2017-09-22 NOTE — Progress Notes (Signed)
DISCONTINUE ON PATHWAY REGIMEN - Non-Small Cell Lung     A cycle is every 28 days:     Cisplatin      Etoposide   **Always confirm dose/schedule in your pharmacy ordering system**    REASON: Other Reason PRIOR TREATMENT: [id number]: Cisplatin 50 mg/m2 Days 1, 8 + Etoposide 50 mg/m2 IV Days 1-5 q28 Days x 2 Cycles + Concurrent RT TREATMENT RESPONSE: Unable to Evaluate  START ON PATHWAY REGIMEN - Non-Small Cell Lung     A cycle is every 21 days:     Paclitaxel      Carboplatin   **Always confirm dose/schedule in your pharmacy ordering system**    Patient Characteristics: Stage IV Metastatic, Squamous, PS = 0, 1, First Line, PD-L1 Expression Positive 1-49% (TPS) / Negative / Not Tested / Not a Candidate for Immunotherapy/Awaiting Test Results AJCC T Category: T3 Current Disease Status: Distant Metastases AJCC N Category: N2 AJCC M Category: M1c AJCC 8 Stage Grouping: IVB Histology: Squamous Cell Line of therapy: First Line PD-L1 Expression Status: Awaiting Test Results Performance Status: PS = 0, 1 Would you be surprised if this patient died  in the next year<= I would NOT be surprised if this patient died in the next year Intent of Therapy: Non-Curative / Palliative Intent, Discussed with Patient

## 2017-09-22 NOTE — Telephone Encounter (Signed)
erroneous

## 2017-09-23 ENCOUNTER — Telehealth: Payer: Self-pay | Admitting: Urology

## 2017-09-23 ENCOUNTER — Telehealth: Payer: Self-pay | Admitting: Medical Oncology

## 2017-09-23 ENCOUNTER — Other Ambulatory Visit: Payer: Medicaid Other

## 2017-09-23 ENCOUNTER — Ambulatory Visit: Payer: Medicaid Other | Admitting: Hematology and Oncology

## 2017-09-23 ENCOUNTER — Telehealth: Payer: Self-pay | Admitting: *Deleted

## 2017-09-23 DIAGNOSIS — C3412 Malignant neoplasm of upper lobe, left bronchus or lung: Secondary | ICD-10-CM

## 2017-09-23 NOTE — Telephone Encounter (Signed)
CALLED PATIENT TO INFORM OF APPT. WITH Levi Brown ON 09-27-17 @ 9 AM, LVM FOR A RETURN CALL

## 2017-09-23 NOTE — Telephone Encounter (Signed)
I spoke with Levi Brown this morning returning his call. He reports that he recently met with Dr. Julien Nordmann yesterday and the decision was to proceed with palliative systemic therapy. However, since that time, he has had some reservations regarding his desire to proceed with chemotherapy. At this point, he questioned whether he is up for it and wants to know what will happen if he does not undergo systemic therapy.  I explained that his disease with most certainly progress but advised that this discussion would be much more appropriate to have with Dr. Julien Nordmann. I also recommended a palliative care consult with Levi Lessen, NP to further discuss goals of care and is he is highly interested in this discussion.  I will place the referral and also advised that I will share this information with Levi Blizzard, RN nurse navigator with Dr. Julien Nordmann so that she can help arrange for further conversation regarding whether or not he wishes to proceed with systemic therapy at this time.   [name], PA-C

## 2017-09-23 NOTE — Telephone Encounter (Signed)
Returned pt call. He wants to put chemotherapy /immun on hold until he sees Dr Tammi Klippel. He indicated he wants to recover from radiation and  also get a scan to see how the radiation effected his cancer. I transferred his call to xrt.

## 2017-09-23 NOTE — Telephone Encounter (Signed)
-----   Message from Kerri Perches sent at 09/23/2017 10:25 AM EDT ----- Regarding: PHONE CALL Hi Sulayman Manning,   Please call this patient.  Mr. Woolbright's phone number is [phone].  Thanks,  [employer]

## 2017-09-27 ENCOUNTER — Ambulatory Visit
Admission: RE | Admit: 2017-09-27 | Discharge: 2017-09-27 | Disposition: A | Discharge status: Home / Self Care | Payer: Medicaid Other | Source: Ambulatory Visit | Attending: Radiation Oncology | Admitting: Radiation Oncology

## 2017-09-27 ENCOUNTER — Other Ambulatory Visit: Payer: Medicaid Other

## 2017-09-27 ENCOUNTER — Telehealth: Payer: Self-pay | Admitting: *Deleted

## 2017-09-27 ENCOUNTER — Telehealth: Payer: Self-pay | Admitting: Medical Oncology

## 2017-09-27 DIAGNOSIS — Z515 Encounter for palliative care: Secondary | ICD-10-CM

## 2017-09-27 DIAGNOSIS — Z7189 Other specified counseling: Secondary | ICD-10-CM

## 2017-09-27 DIAGNOSIS — C3490 Malignant neoplasm of unspecified part of unspecified bronchus or lung: Secondary | ICD-10-CM

## 2017-09-27 DIAGNOSIS — Z923 Personal history of irradiation: Secondary | ICD-10-CM | POA: Insufficient documentation

## 2017-09-27 DIAGNOSIS — Z79899 Other long term (current) drug therapy: Secondary | ICD-10-CM | POA: Insufficient documentation

## 2017-09-27 DIAGNOSIS — Z7982 Long term (current) use of aspirin: Secondary | ICD-10-CM | POA: Insufficient documentation

## 2017-09-27 NOTE — Telephone Encounter (Signed)
ok 

## 2017-09-27 NOTE — Telephone Encounter (Signed)
Oncology Nurse Navigator Documentation  Oncology Nurse Navigator Flowsheets 09/27/2017  Navigator Location [facility]  Navigator Encounter Type Telephone  Telephone Outgoing Call/I received a message from rad onc that patient needed more information about systemic treatment. I called patient. He states he is coming in today and he would like to speak with me. I asked that he ask front desk for me and I will see him today.   Treatment Phase Treatment  Barriers/Navigation Needs Education  Education Other  Interventions Education  Education Method Verbal  Acuity Level 2  Acuity Level 2 Educational needs  Time Spent with Patient 30

## 2017-09-27 NOTE — Telephone Encounter (Addendum)
Pt spoke to Fannie Knee wants to proceed with tx. I called his sister and told her tx is scheduled. She said to call pt with appts . Pt notified of appts. next week at 0830. He said he sister cannot bring him before 1000. I told him to call his sister and tell her to come to cancer center tomorrow to get his appt calendar .

## 2017-09-27 NOTE — Consult Note (Signed)
Consultation Note Date: 09/27/2017   Patient Name: Levi Brown  DOB: [birth year]  MRN: [id number]  Age / Sex: 61 y.o., male  PCP: [name], FNP Referring Physician: Tyler Pita, MD  Reason for Consultation: Establishing goals of care and Psychosocial/spiritual support  HPI/Patient Profile: 61 y.o. male  with past medical history of : per medical records  DIAGNOSIS: stage IV (T4, N3, M1c) non-small cell lung cancer, squamous cell carcinoma presented with left sided obstructing mediastinal mass with complete occlusion of the left upper lobe bronchus and postobstructive atelectasis of the left upper lobe as well as contralateral right paratracheal lymphadenopathy and hypermetabolic metastasis in the sternum as well as left pleural effusion diagnosed in July 2018  Status post a course of palliative radiotherapy to the large left mediastinal mass and the sternum under the care of Dr. Tammi Klippel.  CURRENT THERAPY: systemic chemotherapy was carboplatin for AUC of 5, paclitaxel 175 MG/M2 and Ketruda (pembrolizumab) 200 MG IV every 3 weeks. First dose 10/05/2017.     Recommended treatment per oncology, there was some discrepancies today between the patient and his sister regarding initiation of chemotherapy.  Today patient confirms with his sister that he indeed plans to start chemotherapy and awaits callback from the cancer center.  I will follow up  with the cancer center to make them aware of the treatment plan  Patient reports continued physical decline.  He reports weight loss, poor appetite, fatigue and weakness.  Patient and his family face advanced directive decisions, treatment option decisions and anticipatory care needs.  Clinical Assessment and Goals of Care:   This NP Wadie Lessen reviewed medical records, received report from team,  and then meet the patient and his sister in the  outpatient radiation oncology clinic to discuss the role of palliative medicine in a holistic treatment plan, diagnosis, goals of care and wishes.  Concept of Hospice and Palliative Care were discussed  A  discussion was had today regarding advanced directives.  Concepts specific to code status, artifical feeding and hydration, continued IV antibiotics and rehospitalization was had.  The difference between a aggressive medical intervention path  and a palliative comfort care path for this patient at this time was had.  Values and goals of care important to patient and family were attempted to be elicited.   Questions and concerns addressed.   Family encouraged to call with questions or concerns.  PMT will continue to support holistically.  We discussed the seriousness of his diagnosis and the fact that treatments were palliative in nature.  Discussed with patient the importance of continued conversation with family and their  medical providers regarding overall plan of care and treatment options,  ensuring decisions are within the context of the patients values and GOCs.    SUMMARY OF RECOMMENDATIONS    Code Status/Advance Care Planning:  Full code Encouraged patient to consider DNR/DNI status considering evidence-based poor outcomes in similar patients.   Additional Recommendations (Limitations, Scope, Preferences):  Full Scope Treatment  Psycho-social/Spiritual:   Desire for  further Chaplaincy support:no  Additional Recommendations: Education on Hospice  Prognosis:   Unable to determine- long-term prognosis is poor, likely less than 6 months, dependent on desire for life prolonging treatments.     Primary Diagnoses: Present on Admission: **None**   I have reviewed the medical record, interviewed the patient and family, and examined the patient. The following aspects are pertinent.  Past Medical History:  Diagnosis Date  . Former smoker    Quit smoking in early 2017    . Non-small cell cancer of left lung ([place]) 07/13/2017  . Stroke [facility]) 07/07/2017   [place]   Social History   Social History  . Marital status: Single    Spouse name: N/A  . Number of children: N/A  . Years of education: N/A   Social History Main Topics  . Smoking status: Former Smoker    Packs/day: 1.00    Years: 30.00    Types: Cigarettes    Quit date: 2017  . Smokeless tobacco: Never Used     Comment: quit smoking in 2017  . Alcohol use 8.4 - 12.6 oz/week    14 - 21 Cans of beer per week  . Drug use: No  . Sexual activity: No   Other Topics Concern  . Not on file   Social History Narrative  . No narrative on file   Family History  Problem Relation Age of Onset  . Asthma Mother   . COPD Mother   . Cancer Mother        breast?   Scheduled Meds: Continuous Infusions: PRN Meds:. Medications Prior to Admission:  Prior to Admission medications   Medication Sig Start Date End Date Taking? Authorizing Provider  acetaminophen (TYLENOL) 325 MG tablet Take 325 mg by mouth every 6 (six) hours as needed for moderate pain.    [provider]  albuterol (PROVENTIL HFA;VENTOLIN HFA) 108 (90 Base) MCG/ACT inhaler Inhale 2 puffs into the lungs every 4 (four) hours as needed for wheezing or shortness of breath (cough, shortness of breath or wheezing.). 08/17/17   Tresa Garter, MD  amLODipine (NORVASC) 5 MG tablet Take 1 tablet (5 mg total) by mouth daily. 09/06/17   [name], FNP  aspirin EC 81 MG tablet Take 1 tablet (81 mg total) by mouth daily. 07/08/17 07/08/18  Florencia Reasons, MD  Dextromethorphan Polistirex (COUGH DM PO) Take 30 mLs by mouth daily as needed (cough).    [provider]  feeding supplement (BOOST / RESOURCE BREEZE) LIQD Take 1 Container by mouth 3 (three) times daily between meals. 09/04/17   Florencia Reasons, MD  fluticasone furoate-vilanterol (BREO ELLIPTA) 100-25 MCG/INH AEPB Inhale 1 puff into the lungs daily. 08/31/17   Jani Gravel, MD   furosemide (LASIX) 20 MG tablet Take 1 tablet (20 mg total) by mouth daily as needed. Patient taking differently: Take 20 mg by mouth daily as needed for fluid or edema.  08/23/17   [name], FNP  ipratropium-albuterol (DUONEB) 0.5-2.5 (3) MG/3ML SOLN Take 3 mLs by nebulization every 4 (four) hours as needed. 08/31/17   Jani Gravel, MD  metoprolol tartrate (LOPRESSOR) 25 MG tablet Take 0.5 tablets (12.5 mg total) by mouth 2 (two) times daily. 09/06/17   [name], FNP  Multiple Vitamins-Minerals (DRY EYE FORMULA PO) Place 1 drop into both eyes daily.    [provider]  ondansetron (ZOFRAN ODT) 4 MG disintegrating tablet Take 1 tablet (4 mg total) by mouth every 8 (eight) hours  as needed for nausea or vomiting. Patient not taking: Reported on 09/22/2017 08/23/17   [name], FNP  pantoprazole (PROTONIX) 40 MG tablet Take 1 tablet (40 mg total) by mouth at bedtime. 09/06/17   [name], FNP  potassium chloride SA (K-DUR,KLOR-CON) 20 MEQ tablet Take 1 tablet (20 mEq total) by mouth daily. 08/17/17   [name], FNP  prochlorperazine (COMPAZINE) 10 MG tablet Take 1 tablet (10 mg total) by mouth every 6 (six) hours as needed for nausea or vomiting. 09/22/17   Curt Bears, MD  senna-docusate (SENOKOT-S) 8.6-50 MG tablet Take 1-2 tablets by mouth at bedtime. 09/15/17   [name], FNP  sucralfate (CARAFATE) 1 GM/10ML suspension Take 10 mLs (1 g total) by mouth 3 (three) times daily with meals. 09/15/17   [name], FNP  traZODone (DESYREL) 50 MG tablet Take 0.5-1 tablets (25-50 mg total) by mouth at bedtime as needed for sleep. 08/23/17   [name], FNP  Wound Cleansers (RADIAPLEX EX) Apply topically.    [provider]   No Known Allergies Review of Systems  Constitutional: Positive for activity change, appetite change and fatigue.  HENT: Positive for trouble swallowing.   Neurological: Positive for weakness.     Physical Exam  Constitutional: He is oriented to person, place, and time. He appears cachectic. He appears ill.  HENT:  Mouth/Throat: Oropharynx is clear and moist.  Pulmonary/Chest:  hoarseness noted  Neurological: He is alert and oriented to person, place, and time.  Skin: Skin is warm and dry.    Vital Signs: There were no vitals taken for this visit.         SpO2:   O2 Device:  O2 Flow Rate: .   IO: Intake/output summary: No intake or output data in the 24 hours ending 09/27/17 1523  LBM:   Baseline Weight:   Most recent weight:       Palliative Assessment/Data: 50%   Discussed with Dr Lew Dawes nurse Beverlee Nims  Time In: 0930 Time Out: 2751 Time Total: 75 nib  Greater than 50%  of this time was spent counseling and coordinating care related to the above assessment and plan.  Signed by: Wadie Lessen, NP   Please contact Palliative Medicine Team phone at [phone] for questions and concerns.  For individual provider: See Shea Evans

## 2017-09-28 ENCOUNTER — Telehealth: Payer: Self-pay | Admitting: Oncology

## 2017-09-28 NOTE — Telephone Encounter (Signed)
Patient refused to be scheduled for appointment per 10/15 sch message.

## 2017-09-29 ENCOUNTER — Ambulatory Visit (INDEPENDENT_AMBULATORY_CARE_PROVIDER_SITE_OTHER): Payer: Medicaid Other | Admitting: Family Medicine

## 2017-09-29 ENCOUNTER — Encounter: Payer: Self-pay | Admitting: Family Medicine

## 2017-09-29 ENCOUNTER — Telehealth: Payer: Self-pay | Admitting: *Deleted

## 2017-09-29 ENCOUNTER — Encounter (HOSPITAL_COMMUNITY): Payer: Self-pay

## 2017-09-29 ENCOUNTER — Ambulatory Visit: Payer: Self-pay | Admitting: Family Medicine

## 2017-09-29 VITALS — BP 102/78 | HR 105 | Temp 98.0°F | Resp 12 | Ht 65.0 in | Wt 148.0 lb

## 2017-09-29 DIAGNOSIS — R530 Neoplastic (malignant) related fatigue: Secondary | ICD-10-CM | POA: Diagnosis not present

## 2017-09-29 DIAGNOSIS — I959 Hypotension, unspecified: Secondary | ICD-10-CM | POA: Diagnosis not present

## 2017-09-29 LAB — CBC WITH DIFFERENTIAL/PLATELET
BASOS ABS: 29 {cells}/uL (ref 0–200)
Basophils Relative: 0.3 %
Eosinophils Absolute: 2650 cells/uL — ABNORMAL HIGH (ref 15–500)
Eosinophils Relative: 27.6 %
HEMATOCRIT: 25.9 % — AB (ref 38.5–50.0)
Hemoglobin: 7.9 g/dL — ABNORMAL LOW (ref 13.2–17.1)
LYMPHS ABS: 163 {cells}/uL — AB (ref 850–3900)
MCH: 27.1 pg (ref 27.0–33.0)
MCHC: 30.5 g/dL — AB (ref 32.0–36.0)
MCV: 88.7 fL (ref 80.0–100.0)
MPV: 9 fL (ref 7.5–12.5)
Monocytes Relative: 9.1 %
NEUTROS PCT: 61.3 %
Neutro Abs: 5885 cells/uL (ref 1500–7800)
PLATELETS: 452 10*3/uL — AB (ref 140–400)
RBC: 2.92 10*6/uL — ABNORMAL LOW (ref 4.20–5.80)
RDW: 16.5 % — AB (ref 11.0–15.0)
TOTAL LYMPHOCYTE: 1.7 %
WBC: 9.6 10*3/uL (ref 3.8–10.8)
WBCMIX: 874 {cells}/uL (ref 200–950)

## 2017-09-29 LAB — IRON,TIBC AND FERRITIN PANEL

## 2017-09-29 MED ORDER — FERROUS SULFATE 325 (65 FE) MG PO TABS: 325.0000 mg | ORAL_TABLET | Freq: Two times a day (BID) | ORAL | 3 refills | Status: AC

## 2017-09-29 MED ORDER — METOPROLOL TARTRATE 25 MG PO TABS: 25.0000 mg | ORAL_TABLET | Freq: Every day | ORAL | 3 refills | Status: AC

## 2017-09-29 MED FILL — METOPROLOL TARTRATE 25 MG T: 25 | 30 days supply | Qty: 30 | Fill #0

## 2017-09-29 MED FILL — FERROUS SULFATE 325 MG TAB: 325 (65 FE) | 30 days supply | Qty: 60 | Fill #0

## 2017-09-29 NOTE — Telephone Encounter (Signed)
Oncology Nurse Navigator Documentation  Oncology Nurse Navigator Flowsheets 09/29/2017  Navigator Location [facility]  Navigator Encounter Type Telephone/Levi Brown was a no show to his chemo education class. I called him today to check on him and re-schedule. I left vm message for him to call me with my name and phone number.   Telephone Outgoing Call  Barriers/Navigation Needs Coordination of Care  Interventions Coordination of Care  Coordination of Care Other  Acuity Level 1  Time Spent with Patient 15

## 2017-09-29 NOTE — Progress Notes (Signed)
Patient ID: Levi Brown, male    DOB: [birth year], 61 y.o.   MRN: [id number]  PCP: [name], FNP  Chief Complaint  Patient presents with  . Fatigue    Subjective:  HPI Levi Brown is a 61 y.o. male presents for evaluation of fatigue.  Levi Brown recently completed radiation treatments 09/24/17.  He reports since undergoing radiation he has complained of worsening fatigue. However over the last week or so, his fatigue has been more pronounced and debilitating.  He reports frequent naps throughout the day.  He is drinking boost a couple times per day. Per note from Dr. Julien Nordmann, Levi Brown was expected to start chemotherapy on  10/05/2017 and he was advised to take iron supplements daily which he hasn't started. Levi Brown is reluctant to start chemotherapy next week as feels that he far to week to tolerate treatments. He reports continued resolution of lower extremity swelling and breathing is improved with oxygen and nebulizer treatments. Social History   Social History  . Marital status: Single    Spouse name: N/A  . Number of children: N/A  . Years of education: N/A   Occupational History  . Not on file.   Social History Main Topics  . Smoking status: Former Smoker    Packs/day: 1.00    Years: 30.00    Types: Cigarettes    Quit date: 2017  . Smokeless tobacco: Never Used     Comment: quit smoking in 2017  . Alcohol use 8.4 - 12.6 oz/week    14 - 21 Cans of beer per week  . Drug use: No  . Sexual activity: No   Other Topics Concern  . Not on file   Social History Narrative  . No narrative on file    Family History  Problem Relation Age of Onset  . Asthma Mother   . COPD Mother   . Cancer Mother        breast?     Review of Systems  Patient Active Problem List   Diagnosis Date Noted  . Edema 08/31/2017  . Hypokalemia 08/29/2017  . Acute respiratory failure with hypoxia and hypercapnia ([place]) 08/26/2017  . Pleural effusion on left 08/26/2017  .  Collapse of left lung 08/26/2017  . Hyponatremia 08/26/2017  . COPD exacerbation ([place]) 08/26/2017  . Obstructive pneumonia 08/26/2017  . Non-small cell cancer of left lung ([place])   . Non-small cell cancer of left lung ([place]) 07/13/2017  . Elevated blood pressure reading 07/05/2017    No Known Allergies  Prior to Admission medications   Medication Sig Start Date End Date Taking? Authorizing Provider  acetaminophen (TYLENOL) 325 MG tablet Take 325 mg by mouth every 6 (six) hours as needed for moderate pain.   Yes [provider]  albuterol (PROVENTIL HFA;VENTOLIN HFA) 108 (90 Base) MCG/ACT inhaler Inhale 2 puffs into the lungs every 4 (four) hours as needed for wheezing or shortness of breath (cough, shortness of breath or wheezing.). 08/17/17  Yes Jegede, Olugbemiga E, MD  amLODipine (NORVASC) 5 MG tablet Take 1 tablet (5 mg total) by mouth daily. 09/06/17  Yes [name], FNP  aspirin EC 81 MG tablet Take 1 tablet (81 mg total) by mouth daily. 07/08/17 07/08/18 Yes Florencia Reasons, MD  Dextromethorphan Polistirex (COUGH DM PO) Take 30 mLs by mouth daily as needed (cough).   Yes [provider]  feeding supplement (BOOST / RESOURCE BREEZE) LIQD Take 1 Container by mouth 3 (three) times daily between  meals. 09/04/17  Yes Florencia Reasons, MD  fluticasone furoate-vilanterol (BREO ELLIPTA) 100-25 MCG/INH AEPB Inhale 1 puff into the lungs daily. 08/31/17  Yes Jani Gravel, MD  furosemide (LASIX) 20 MG tablet Take 1 tablet (20 mg total) by mouth daily as needed. Patient taking differently: Take 20 mg by mouth daily as needed for fluid or edema.  08/23/17  Yes [name], FNP  ipratropium-albuterol (DUONEB) 0.5-2.5 (3) MG/3ML SOLN Take 3 mLs by nebulization every 4 (four) hours as needed. 08/31/17  Yes Jani Gravel, MD  metoprolol tartrate (LOPRESSOR) 25 MG tablet Take 0.5 tablets (12.5 mg total) by mouth 2 (two) times daily. 09/06/17  Yes [name], FNP  Multiple Vitamins-Minerals (DRY  EYE FORMULA PO) Place 1 drop into both eyes daily.   Yes [provider]  ondansetron (ZOFRAN ODT) 4 MG disintegrating tablet Take 1 tablet (4 mg total) by mouth every 8 (eight) hours as needed for nausea or vomiting. 08/23/17  Yes [name], FNP  pantoprazole (PROTONIX) 40 MG tablet Take 1 tablet (40 mg total) by mouth at bedtime. 09/06/17  Yes [name], FNP  potassium chloride SA (K-DUR,KLOR-CON) 20 MEQ tablet Take 1 tablet (20 mEq total) by mouth daily. 08/17/17  Yes [name], FNP  prochlorperazine (COMPAZINE) 10 MG tablet Take 1 tablet (10 mg total) by mouth every 6 (six) hours as needed for nausea or vomiting. 09/22/17  Yes Curt Bears, MD  senna-docusate (SENOKOT-S) 8.6-50 MG tablet Take 1-2 tablets by mouth at bedtime. 09/15/17  Yes [name], FNP  sucralfate (CARAFATE) 1 GM/10ML suspension Take 10 mLs (1 g total) by mouth 3 (three) times daily with meals. 09/15/17  Yes [name], FNP  traZODone (DESYREL) 50 MG tablet Take 0.5-1 tablets (25-50 mg total) by mouth at bedtime as needed for sleep. 08/23/17  Yes [name], FNP  Wound Cleansers (RADIAPLEX EX) Apply topically.   Yes [provider]    Past Medical, Surgical Family and Social History reviewed and updated.    Objective:   Today's Vitals   09/29/17 1102  BP: 102/78  Pulse: (!) 105  Resp: 12  Temp: 98 F (36.7 C)  TempSrc: Oral  SpO2: 100%  Weight: 148 lb (67.1 kg)  Height: 5\' 5"  (1.651 m)    Wt Readings from Last 3 Encounters:  09/29/17 148 lb (67.1 kg)  09/22/17 145 lb 14.4 oz (66.2 kg)  09/15/17 146 lb (66.2 kg)   Physical Exam  Constitutional: He is oriented to person, place, and time. He appears ill. Nasal cannula in place.  HENT:  Head: Normocephalic and atraumatic.  Eyes: Pupils are equal, round, and reactive to light. EOM are normal.  Cardiovascular: Normal rate, regular rhythm, normal heart sounds and intact distal pulses.    Pulmonary/Chest: Effort normal.  Currently on 2 liters via nasal cannula. Wheezes and crackles auscultated R/Lupper lobes  Abdominal: Soft. Bowel sounds are normal.  Musculoskeletal: Normal range of motion.  Neurological: He is alert and oriented to person, place, and time.  Skin: Skin is warm and dry.  Psychiatric: He has a normal mood and affect. His behavior is normal. Judgment and thought content normal.   Assessment & Plan:  1. Neoplastic malignant related fatigue, fatigue most likely related to progressive non-small cell lung cancer.  Checking a CBC to ensure that hemoglobin is stable.  Recent labs showed marked decrease in hemoglobin.  Also will check a iron panel. Starting patient on iron supplements at  325 mg, 3 times daily in hopes of improving his fatigue. Encouraging to continue boost drinks for nutritional supplementation. 2. Low blood pressure BP rather low today, will decrease in dose of metoprolol 25 mg once daily and discontinued amlodipine. - Iron, TIBC and Ferritin Panel - CBC with Differential/Platelet  Return for care in 2 weeks.  Follow-up with oncology regarding chemotherapy schedule.   Carroll Sage. Kenton Kingfisher, MSN, FNP-C [facility]  [address] [place], [place] [address] [phone]

## 2017-09-29 NOTE — Patient Instructions (Addendum)
Discontinue Amlodipine. Reducing metoprolol 25 mg once daily.   Continue to hydrate and drink boost. Adding iron tablets, take 325 mg twice daily.

## 2017-09-30 ENCOUNTER — Telehealth: Payer: Self-pay | Admitting: Family Medicine

## 2017-09-30 NOTE — Telephone Encounter (Signed)
He has medicaid and can schedule an appointment without a referral. Give him the phone number to [facility].

## 2017-10-04 DIAGNOSIS — Z515 Encounter for palliative care: Secondary | ICD-10-CM | POA: Insufficient documentation

## 2017-10-04 DIAGNOSIS — Z7189 Other specified counseling: Secondary | ICD-10-CM | POA: Insufficient documentation

## 2017-10-04 NOTE — Telephone Encounter (Signed)
Patient given information for [facility] center

## 2017-10-05 ENCOUNTER — Ambulatory Visit: Payer: Medicaid Other

## 2017-10-05 ENCOUNTER — Ambulatory Visit: Payer: Medicaid Other | Admitting: Oncology

## 2017-10-05 ENCOUNTER — Other Ambulatory Visit: Payer: Medicaid Other

## 2017-10-05 ENCOUNTER — Telehealth: Payer: Self-pay | Admitting: *Deleted

## 2017-10-05 NOTE — Telephone Encounter (Signed)
Oncology Nurse Navigator Documentation  Oncology Nurse Navigator Flowsheets 10/05/2017  Navigator Location [facility]  Navigator Encounter Type Telephone/Levi Brown was a no show today. I called to check on him. He states he is not feeling well and didn't want treatment today.  I listened as he explained. I updated him on treatment and his cancer.  He states he would like to come here tomorrow and talk about his cancer. I stated I am here and we can talk anytime.   Telephone Outgoing Call  Barriers/Navigation Needs Coordination of Care;Education  Education Other  Interventions Coordination of Care;Education  Coordination of Care Other  Education Method Verbal  Acuity Level 2  Time Spent with Patient 30

## 2017-10-06 ENCOUNTER — Encounter: Payer: Self-pay | Admitting: *Deleted

## 2017-10-06 ENCOUNTER — Other Ambulatory Visit (INDEPENDENT_AMBULATORY_CARE_PROVIDER_SITE_OTHER): Payer: Medicaid Other | Admitting: Family Medicine

## 2017-10-06 DIAGNOSIS — E878 Other disorders of electrolyte and fluid balance, not elsewhere classified: Secondary | ICD-10-CM

## 2017-10-06 DIAGNOSIS — Z0189 Encounter for other specified special examinations: Secondary | ICD-10-CM

## 2017-10-06 DIAGNOSIS — D649 Anemia, unspecified: Secondary | ICD-10-CM

## 2017-10-06 LAB — COMPLETE METABOLIC PANEL WITH GFR
AG Ratio: 0.9 (calc) — ABNORMAL LOW (ref 1.0–2.5)
ALKALINE PHOSPHATASE (APISO): 73 U/L (ref 40–115)
ALT: 27 U/L (ref 9–46)
AST: 23 U/L (ref 10–35)
Albumin: 2.9 g/dL — ABNORMAL LOW (ref 3.6–5.1)
BILIRUBIN TOTAL: 0.3 mg/dL (ref 0.2–1.2)
BUN/Creatinine Ratio: 13 (calc) (ref 6–22)
BUN: 5 mg/dL — AB (ref 7–25)
CHLORIDE: 90 mmol/L — AB (ref 98–110)
CO2: 41 mmol/L — ABNORMAL HIGH (ref 20–32)
Calcium: 9.1 mg/dL (ref 8.6–10.3)
Creat: 0.38 mg/dL — ABNORMAL LOW (ref 0.70–1.33)
GFR, Est African American: 154 mL/min/{1.73_m2} (ref >=60)
GFR, Est Non African American: 133 mL/min/{1.73_m2} (ref >=60)
GLUCOSE: 85 mg/dL (ref 65–99)
Globulin: 3.4 g/dL (calc) (ref 1.9–3.7)
Potassium: 4.3 mmol/L (ref 3.5–5.3)
Sodium: 136 mmol/L (ref 135–146)
TOTAL PROTEIN: 6.3 g/dL (ref 6.1–8.1)

## 2017-10-06 LAB — CBC WITH DIFFERENTIAL/PLATELET
BASOS PCT: 0.2 %
Basophils Absolute: 21 cells/uL (ref 0–200)
EOS ABS: 2088 {cells}/uL — AB (ref 15–500)
Eosinophils Relative: 19.7 %
HCT: 25.5 % — ABNORMAL LOW (ref 38.5–50.0)
Hemoglobin: 7.8 g/dL — ABNORMAL LOW (ref 13.2–17.1)
Lymphs Abs: 265 cells/uL — ABNORMAL LOW (ref 850–3900)
MCH: 26.8 pg — AB (ref 27.0–33.0)
MCHC: 30.6 g/dL — AB (ref 32.0–36.0)
MCV: 87.6 fL (ref 80.0–100.0)
MONOS PCT: 12.6 %
MPV: 9.1 fL (ref 7.5–12.5)
Neutro Abs: 6890 cells/uL (ref 1500–7800)
Neutrophils Relative %: 65 %
PLATELETS: 537 10*3/uL — AB (ref 140–400)
RBC: 2.91 10*6/uL — ABNORMAL LOW (ref 4.20–5.80)
RDW: 15.8 % — ABNORMAL HIGH (ref 11.0–15.0)
TOTAL LYMPHOCYTE: 2.5 %
WBC mixed population: 1336 cells/uL — ABNORMAL HIGH (ref 200–950)
WBC: 10.6 10*3/uL (ref 3.8–10.8)

## 2017-10-06 NOTE — Progress Notes (Signed)
Oncology Nurse Navigator Documentation  Oncology Nurse Navigator Flowsheets 10/06/2017  Navigator Location [facility]  Navigator Encounter Type Clinic/MDC/I spoke with patient and sister today in [place].  He had some concerns about systemic therapy.  I listened as he explained. I educated him on medical oncology on our role to help him with treatment or symptom management. He was thankful for the update and I scheduled him to be seen Nov 6th.  I also notified scheduling to re-schedule his chemo for that day.   Treatment Phase Treatment  Barriers/Navigation Needs Coordination of Care;Education  Education Other  Interventions Coordination of Care;Education  Coordination of Care Appts  Education Method Verbal  Acuity Level 2  Time Spent with Patient 27

## 2017-10-06 NOTE — Progress Notes (Signed)
Labs only visit

## 2017-10-07 ENCOUNTER — Encounter: Payer: Self-pay | Admitting: Pharmacy Technician

## 2017-10-07 ENCOUNTER — Telehealth: Payer: Self-pay | Admitting: Oncology

## 2017-10-07 NOTE — Telephone Encounter (Signed)
Scheduled appt per 10/24 sch message - patient is aware of appt date and time.

## 2017-10-07 NOTE — Progress Notes (Signed)
The patient's drug assistance with [employer] for Neulasta is closed. The patient is now covered by Medicaid. There have been no orders for Neulasta during the enrollment period, therefore no drug was requested.

## 2017-10-07 NOTE — Progress Notes (Signed)
Patient notified

## 2017-10-12 ENCOUNTER — Ambulatory Visit (INDEPENDENT_AMBULATORY_CARE_PROVIDER_SITE_OTHER): Payer: Medicaid Other | Admitting: Family Medicine

## 2017-10-12 ENCOUNTER — Encounter: Payer: Self-pay | Admitting: Family Medicine

## 2017-10-12 ENCOUNTER — Telehealth: Payer: Self-pay | Admitting: Family Medicine

## 2017-10-12 ENCOUNTER — Other Ambulatory Visit: Payer: Medicaid Other

## 2017-10-12 VITALS — BP 110/64 | HR 100 | Temp 98.3°F | Resp 12 | Ht 65.0 in | Wt 150.8 lb

## 2017-10-12 DIAGNOSIS — R000 Tachycardia, unspecified: Secondary | ICD-10-CM | POA: Diagnosis not present

## 2017-10-12 DIAGNOSIS — R0902 Hypoxemia: Secondary | ICD-10-CM

## 2017-10-12 DIAGNOSIS — C3490 Malignant neoplasm of unspecified part of unspecified bronchus or lung: Secondary | ICD-10-CM

## 2017-10-12 MED ORDER — PREDNISONE 20 MG PO TABS: 20.0000 mg | ORAL_TABLET | Freq: Every day | ORAL | 0 refills | Status: DC

## 2017-10-12 MED ORDER — PREDNISONE 20 MG PO TABS: 20.0000 mg | ORAL_TABLET | Freq: Every day | ORAL | 1 refills | Status: DC

## 2017-10-12 MED FILL — predniSONE 20 MG TABS: 20 | 30 days supply | Qty: 30 | Fill #0

## 2017-10-12 NOTE — Telephone Encounter (Signed)
Levi Brown 61 year old male with stage IV non-small cell lung cancer presented in the clinic today and advised me that he has made a decision to no longer pursue chemotherapy which is scheduled for 10/19/2017 as he only has a projected 6 months of life remaining.  He advised me that he has made a decision to visit family, and attempt to stay healthy naturally without attempting the chemotherapy.  He reports that he continues to feel rather weak and feels that the chemotherapy would only worsen his current symptoms of fatigue.  He asked me to contact the oncology team to make you aware of his decision.  However, please feel free to reach out of me or the patient if you have any additional questions and or concerns.  Carroll Sage. Kenton Kingfisher, MSN, FNP-C [facility]  [address] [place], [place] [address] [phone]

## 2017-10-12 NOTE — Progress Notes (Signed)
Patient ID: Levi Brown, male    DOB: Dec 29, 1955, 61 y.o.   MRN: [id number]  PCP: [name], FNP  Chief Complaint  Patient presents with  . Follow-up    2 weeks    Subjective:  HPI Levi Brown is a 61 y.o. male with non-small cell lung cancer, presents for a two week follow-up. He reports that his fatigue has improved, although he continues to experience residual weakness. Gram had originally decided to proceed with systemic chemotherapy, however, after further thought, he has decided to forego chemotherapy as he feels that the chemotherapy will only cause worsening weakness and possibly result in a shorter lifespan. He reports that oncologist has predicted an overall estimate life expectancy of 6 months or less. He is currently residing with his sister and she is assisting with all of his care needs. He continues to be dependently continuously on oxygen therapy. His hemoglobin has remain in low 7 range however, he remains asymptomatic and has recently starting taking iron supplements consistently. He has no other complaints today. Social History   Social History  . Marital status: Single    Spouse name: N/A  . Number of children: N/A  . Years of education: N/A   Occupational History  . Not on file.   Social History Main Topics  . Smoking status: Former Smoker    Packs/day: 1.00    Years: 30.00    Types: Cigarettes    Quit date: 2017  . Smokeless tobacco: Never Used     Comment: quit smoking in 2017  . Alcohol use 8.4 - 12.6 oz/week    14 - 21 Cans of beer per week  . Drug use: No  . Sexual activity: No   Other Topics Concern  . Not on file   Social History Narrative  . No narrative on file    Family History  Problem Relation Age of Onset  . Asthma Mother   . COPD Mother   . Cancer Mother        breast?     Review of Systems  Patient Active Problem List   Diagnosis Date Noted  . Palliative care by specialist   . DNR (do not resuscitate)  discussion   . Edema 08/31/2017  . Hypokalemia 08/29/2017  . Acute respiratory failure with hypoxia and hypercapnia ([place]) 08/26/2017  . Pleural effusion on left 08/26/2017  . Collapse of left lung 08/26/2017  . Hyponatremia 08/26/2017  . COPD exacerbation ([place]) 08/26/2017  . Obstructive pneumonia 08/26/2017  . Primary malignant neoplasm of lung metastatic to other site [facility])   . Non-small cell cancer of left lung ([place]) 07/13/2017  . Elevated blood pressure reading 07/05/2017    No Known Allergies  Prior to Admission medications   Medication Sig Start Date End Date Taking? Authorizing Provider  acetaminophen (TYLENOL) 325 MG tablet Take 325 mg by mouth every 6 (six) hours as needed for moderate pain.   Yes [provider]  albuterol (PROVENTIL HFA;VENTOLIN HFA) 108 (90 Base) MCG/ACT inhaler Inhale 2 puffs into the lungs every 4 (four) hours as needed for wheezing or shortness of breath (cough, shortness of breath or wheezing.). 08/17/17  Yes Tresa Garter, MD  aspirin EC 81 MG tablet Take 1 tablet (81 mg total) by mouth daily. 07/08/17 07/08/18 Yes Florencia Reasons, MD  Dextromethorphan Polistirex (COUGH DM PO) Take 30 mLs by mouth daily as needed (cough).   Yes [provider]  feeding supplement (BOOST / RESOURCE  BREEZE) LIQD Take 1 Container by mouth 3 (three) times daily between meals. 09/04/17  Yes Florencia Reasons, MD  ferrous sulfate (FERROUSUL) 325 (65 FE) MG tablet Take 1 tablet (325 mg total) by mouth 2 (two) times daily with a meal. 09/29/17  Yes [name], FNP  fluticasone furoate-vilanterol (BREO ELLIPTA) 100-25 MCG/INH AEPB Inhale 1 puff into the lungs daily. 08/31/17  Yes Jani Gravel, MD  furosemide (LASIX) 20 MG tablet Take 1 tablet (20 mg total) by mouth daily as needed. Patient taking differently: Take 20 mg by mouth daily as needed for fluid or edema.  08/23/17  Yes [name], FNP  ipratropium-albuterol (DUONEB) 0.5-2.5 (3) MG/3ML SOLN Take 3 mLs by  nebulization every 4 (four) hours as needed. 08/31/17  Yes Jani Gravel, MD  metoprolol tartrate (LOPRESSOR) 25 MG tablet Take 1 tablet (25 mg total) by mouth daily. 09/29/17  Yes [name], FNP  Multiple Vitamins-Minerals (DRY EYE FORMULA PO) Place 1 drop into both eyes daily.   Yes [provider]  ondansetron (ZOFRAN ODT) 4 MG disintegrating tablet Take 1 tablet (4 mg total) by mouth every 8 (eight) hours as needed for nausea or vomiting. 08/23/17  Yes [name], FNP  pantoprazole (PROTONIX) 40 MG tablet Take 1 tablet (40 mg total) by mouth at bedtime. 09/06/17  Yes [name], FNP  potassium chloride SA (K-DUR,KLOR-CON) 20 MEQ tablet Take 1 tablet (20 mEq total) by mouth daily. 08/17/17  Yes [name], FNP  prochlorperazine (COMPAZINE) 10 MG tablet Take 1 tablet (10 mg total) by mouth every 6 (six) hours as needed for nausea or vomiting. 09/22/17  Yes Curt Bears, MD  senna-docusate (SENOKOT-S) 8.6-50 MG tablet Take 1-2 tablets by mouth at bedtime. 09/15/17  Yes [name], FNP  sucralfate (CARAFATE) 1 GM/10ML suspension Take 10 mLs (1 g total) by mouth 3 (three) times daily with meals. 09/15/17  Yes [name], FNP  traZODone (DESYREL) 50 MG tablet Take 0.5-1 tablets (25-50 mg total) by mouth at bedtime as needed for sleep. 08/23/17  Yes [name], FNP  Wound Cleansers (RADIAPLEX EX) Apply topically.   Yes [provider]    Past Medical, Surgical Family and Social History reviewed and updated.    Objective:   Today's Vitals   10/12/17 1125  BP: 110/64  Pulse: 100  Resp: 12  Temp: 98.3 F (36.8 C)  TempSrc: Oral  SpO2: 98%  Weight: 150 lb 12.8 oz (68.4 kg)  Height: 5\' 5"  (1.651 m)    Wt Readings from Last 3 Encounters:  10/12/17 150 lb 12.8 oz (68.4 kg)  09/29/17 148 lb (67.1 kg)  09/22/17 145 lb 14.4 oz (66.2 kg)    Physical Exam  Constitutional: He is oriented to person, place, and time. He appears  ill. No distress. Nasal cannula in place.  HENT:  Head: Normocephalic and atraumatic.  Eyes: Pupils are equal, round, and reactive to light. Conjunctivae and EOM are normal.  Neck: Normal range of motion. Neck supple.  Cardiovascular: Normal rate, regular rhythm, normal heart sounds and intact distal pulses.   Pulmonary/Chest: Effort normal. He has decreased breath sounds. He has wheezes.  Musculoskeletal: Normal range of motion.  Neurological: He is alert and oriented to person, place, and time.  Skin: Skin is warm and dry.  Psychiatric: He has a normal mood and affect. His behavior is normal. Judgment and thought content normal.   Assessment & Plan:  1. Non-small cell  carcinoma of lung, stage 4, unspecified laterality [facility]) Mr. Palazzi has decided to forego chemotherapy. Will notify treatment team of patient 's decision. Educated Mr. Westry regarding services available through hospice and palliative care services. Patient will make me aware of when services are needed. I will see in office once monthly to monitor hemoglobin and provide medication interventions necessary to promote health and improve comfort.   2. Tachycardia -Continue metoprolol. Will continue monitor for hypotension.   3. Hypoxia -Continue home oxygen. Starting prednisone 20 mg once daily to decrease bronchospasm and improve ventilation.   Return for care 1 month-check CBC and pain management as needed.    Carroll Sage. Kenton Kingfisher, MSN, FNP-C [facility]  [address] [place], [place] [address] [phone]

## 2017-10-13 ENCOUNTER — Ambulatory Visit: Payer: Medicaid Other | Admitting: Family Medicine

## 2017-10-16 ENCOUNTER — Telehealth: Payer: Self-pay

## 2017-10-16 NOTE — Telephone Encounter (Signed)
Called to cancel all appts per patient req per 10/31 inbasket message.  Spoke with cousin at home number and then called cell number as that is the pts number.  Advised him to call us next week to get new appt.

## 2017-10-19 ENCOUNTER — Ambulatory Visit: Payer: Medicaid Other | Admitting: Oncology

## 2017-10-19 ENCOUNTER — Other Ambulatory Visit: Payer: Self-pay

## 2017-10-19 ENCOUNTER — Ambulatory Visit: Payer: Medicaid Other | Admitting: Family Medicine

## 2017-10-19 ENCOUNTER — Other Ambulatory Visit: Payer: Medicaid Other

## 2017-10-20 ENCOUNTER — Ambulatory Visit: Payer: Medicaid Other

## 2017-10-26 ENCOUNTER — Other Ambulatory Visit: Payer: Medicaid Other

## 2017-10-26 ENCOUNTER — Ambulatory Visit
Admission: RE | Admit: 2017-10-26 | Discharge: 2017-10-26 | Disposition: A | Discharge status: Home / Self Care | Payer: Medicaid Other | Source: Ambulatory Visit | Attending: Urology | Admitting: Urology

## 2017-10-26 ENCOUNTER — Ambulatory Visit: Payer: Medicaid Other

## 2017-10-26 ENCOUNTER — Encounter: Payer: Self-pay | Admitting: *Deleted

## 2017-10-26 NOTE — Progress Notes (Signed)
[place] Mr. Auld he was not home I  did not leave a message with the man that answered the telephone.   I responded by saying I would  call back tomorrow to speak with Mr. Westbrooks.

## 2017-10-27 ENCOUNTER — Encounter: Payer: Self-pay | Admitting: *Deleted

## 2017-10-27 NOTE — Progress Notes (Signed)
1200 Spoke with Levi Brown "stated he is feeling fine today denies having any problem with SOB, coughing or wheezing at this moment.  He is eating without any swallowing issues.  Pain is under control, no fever.  Explained that he did not need to reschedule his one month with Ashlyn Bruning, PA-C from yesterday since he is doing good and he sees Dr. Julien Nordmann and seeing hospice.  He replied he is not receiving hospice care and has an appointment with [place] on 11-03-17 for his one month follow up appointment.

## 2017-11-02 ENCOUNTER — Other Ambulatory Visit: Payer: Medicaid Other

## 2017-11-02 MED FILL — FERROUS SULFATE 325 MG TAB: 325 (65 FE) | 30 days supply | Qty: 60 | Fill #1

## 2017-11-02 NOTE — Progress Notes (Signed)
Radiation Oncology         ([phone]) [phone] ________________________________  Name: Levi Brown MRN: [id number]  Date: 11/03/2017  DOB: 1956-09-16  Post Treatment Note  CC: [name], FNP  Ardath Sax, MD  Diagnosis:   61 yo man with Stage IV (T4, N3, M1c) non-small cell lung cancer, squamous cell carcinoma.   Interval Since Last Radiation:  6 weeks  Palliative Radiotherapy 07/29/2017 to 09/17/2017:   The primary tumor and involved mediastinal adenopathy were treated to 66 Gy in 33 fractions of 2 Gy.  Narrative:  The patient returns today for routine follow-up.  He tolerated radiation treatment relatively well.  He experienced some esophagitis characterized as pain and difficulty associated with swallowing which was managed with carafate.  The patient also noted significant fatigue and occasional shortness of breath even with continuous oxygen therapy and a productive cough with clear sputum.  He required a brief episode of hospitalization and admission to the ICU due to ARDS.  He recovered and was able to continue radiotherapy to completion thereafter.                            On review of systems, the patient states that he is doing well overall.  He feels that he is gradually regaining his strength and his appetite. He has recently gained 4 pounds which she is quite pleased with. He also reports a significant improvement in his breathing with decreased shortness of breath.  He denies productive cough, hemoptysis, fever, chills or dyspnea.  He was recently offered a course of palliative chemotherapy under the direction and care of Dr. Julien Nordmann but had declined further treatment at that time.  He initially made a decision to visit family, and attempt to stay healthy naturally without attempting the chemotherapy due to the fact that he continued to feel rather weak shortly after completion of radiotherapy and felt that the chemotherapy would only worsen his current symptoms of  fatigue. Over the past several weeks, he has seen significant improvement in his energy and overall well-being and is thus now reconsidering the potential for further treatment if deemed appropriate. He has met with Wadie Lessen, NP and plans to notify her and/or his PCP if/when hospice services are needed but is not interested at this time.  ALLERGIES:  has No Known Allergies.  Meds: Current Outpatient Medications  Medication Sig Dispense Refill  . acetaminophen (TYLENOL) 325 MG tablet Take 325 mg by mouth every 6 (six) hours as needed for moderate pain.    Marland Kitchen aspirin EC 81 MG tablet Take 1 tablet (81 mg total) by mouth daily. 30 tablet 0  . feeding supplement (BOOST / RESOURCE BREEZE) LIQD Take 1 Container by mouth 3 (three) times daily between meals. 30 Container 0  . ferrous sulfate (FERROUSUL) 325 (65 FE) MG tablet Take 1 tablet (325 mg total) by mouth 2 (two) times daily with a meal. 60 tablet 3  . fluticasone furoate-vilanterol (BREO ELLIPTA) 100-25 MCG/INH AEPB Inhale 1 puff into the lungs daily. 1 each 0  . metoprolol tartrate (LOPRESSOR) 25 MG tablet Take 1 tablet (25 mg total) by mouth daily. 30 tablet 3  . Multiple Vitamins-Minerals (DRY EYE FORMULA PO) Place 1 drop into both eyes daily.    . pantoprazole (PROTONIX) 40 MG tablet Take 1 tablet (40 mg total) by mouth at bedtime. 30 tablet 3  . potassium chloride SA (K-DUR,KLOR-CON) 20 MEQ tablet Take 1  tablet (20 mEq total) by mouth daily. 5 tablet 0  . senna-docusate (SENOKOT-S) 8.6-50 MG tablet Take 1-2 tablets by mouth at bedtime. 60 tablet 0  . albuterol (PROVENTIL HFA;VENTOLIN HFA) 108 (90 Base) MCG/ACT inhaler Inhale 2 puffs into the lungs every 4 (four) hours as needed for wheezing or shortness of breath (cough, shortness of breath or wheezing.). (Patient not taking: Reported on 11/03/2017) 54 g 3  . Dextromethorphan Polistirex (COUGH DM PO) Take 30 mLs by mouth daily as needed (cough).    . furosemide (LASIX) 20 MG tablet Take 1  tablet (20 mg total) by mouth daily as needed. (Patient not taking: Reported on 11/03/2017) 30 tablet 1  . ipratropium-albuterol (DUONEB) 0.5-2.5 (3) MG/3ML SOLN Take 3 mLs by nebulization every 4 (four) hours as needed. (Patient not taking: Reported on 11/03/2017) 360 mL 0  . ondansetron (ZOFRAN ODT) 4 MG disintegrating tablet Take 1 tablet (4 mg total) by mouth every 8 (eight) hours as needed for nausea or vomiting. (Patient not taking: Reported on 11/03/2017) 30 tablet 0  . prochlorperazine (COMPAZINE) 10 MG tablet Take 1 tablet (10 mg total) by mouth every 6 (six) hours as needed for nausea or vomiting. (Patient not taking: Reported on 11/03/2017) 30 tablet 0  . sucralfate (CARAFATE) 1 GM/10ML suspension Take 10 mLs (1 g total) by mouth 3 (three) times daily with meals. (Patient not taking: Reported on 11/03/2017) 420 mL 0  . traZODone (DESYREL) 50 MG tablet Take 0.5-1 tablets (25-50 mg total) by mouth at bedtime as needed for sleep. (Patient not taking: Reported on 11/03/2017) 60 tablet 3   No current facility-administered medications for this encounter.     Physical Findings:  height is '5\' 5"'$  (1.651 m) and weight is 153 lb 6.4 oz (69.6 kg). His oral temperature is 97.6 F (36.4 C). His blood pressure is 112/87 and his pulse is 96. His respiration is 18 and oxygen saturation is 100%.  Pain Assessment Pain Score: 0-No pain/10 In general this is a well appearing African-American male in no acute distress. He's alert and oriented x4 and appropriate throughout the examination. Cardiopulmonary assessment is negative for acute distress and he exhibits normal effort.   Lab Findings: Lab Results  Component Value Date   WBC 10.6 10/06/2017   HGB 7.8 (L) 10/06/2017   HCT 25.5 (L) 10/06/2017   MCV 87.6 10/06/2017   PLT 537 (H) 10/06/2017     Radiographic Findings: No results found.  Impression/Plan: [birth year]. 61 yo man with Stage IV (T4, N3, M1c) non-small cell lung cancer, squamous cell  carcinoma.  He appears to have recovered well from the effects of radiotherapy. He was recently offered a course of palliative chemotherapy under the direction and care of Dr. Julien Nordmann but had declined any further treatment initially.  However, since that time, he has seen significant improvement in his energy and feeling of overall wellbeing, so he is now reconsidering further treatment if deemed appropriate.  He would like to proceed with posttreatment imaging to assess his response to treatment. We will arrange for a CT chest, abdomen and pelvis to be performed in the near future and I will plan to call him with those results. At this point, he would like to schedule a follow-up appointment with Dr. Julien Nordmann for further disease surveillance and management and we will plan to see him back on an as-needed basis.  He is in agreement with and is comfortable with this plan. He has previously met with Wadie Lessen, NP  and plans to notify her and/or his PCP if/when hospice services are needed but is not interested at present.    [name], PA-C

## 2017-11-03 ENCOUNTER — Ambulatory Visit
Admission: RE | Admit: 2017-11-03 | Discharge: 2017-11-03 | Disposition: A | Discharge status: Home / Self Care | Payer: Medicaid Other | Source: Ambulatory Visit | Attending: Urology | Admitting: Urology

## 2017-11-03 ENCOUNTER — Encounter: Payer: Self-pay | Admitting: Urology

## 2017-11-03 ENCOUNTER — Other Ambulatory Visit: Payer: Self-pay

## 2017-11-03 VITALS — BP 112/87 | HR 96 | Temp 97.6°F | Resp 18 | Ht 65.0 in | Wt 153.4 lb

## 2017-11-03 DIAGNOSIS — C3490 Malignant neoplasm of unspecified part of unspecified bronchus or lung: Secondary | ICD-10-CM | POA: Diagnosis present

## 2017-11-03 DIAGNOSIS — C3412 Malignant neoplasm of upper lobe, left bronchus or lung: Secondary | ICD-10-CM

## 2017-11-03 DIAGNOSIS — Z923 Personal history of irradiation: Secondary | ICD-10-CM | POA: Diagnosis not present

## 2017-11-03 DIAGNOSIS — Z7982 Long term (current) use of aspirin: Secondary | ICD-10-CM | POA: Diagnosis not present

## 2017-11-03 DIAGNOSIS — Z79899 Other long term (current) drug therapy: Secondary | ICD-10-CM | POA: Diagnosis not present

## 2017-11-03 NOTE — Addendum Note (Signed)
Encounter addended by: Malena Edman, RN on: 11/03/2017 1:34 PM  Actions taken: Charge Capture section accepted

## 2017-11-08 MED FILL — predniSONE 20 MG TABS: 20 | 30 days supply | Qty: 30 | Fill #1

## 2017-11-09 ENCOUNTER — Encounter: Payer: Self-pay | Admitting: Family Medicine

## 2017-11-09 ENCOUNTER — Other Ambulatory Visit: Payer: Medicaid Other

## 2017-11-09 ENCOUNTER — Ambulatory Visit (INDEPENDENT_AMBULATORY_CARE_PROVIDER_SITE_OTHER): Payer: Medicaid Other | Admitting: Family Medicine

## 2017-11-09 VITALS — BP 110/78 | HR 90 | Temp 98.0°F | Resp 14 | Ht 65.0 in | Wt 152.6 lb

## 2017-11-09 DIAGNOSIS — D638 Anemia in other chronic diseases classified elsewhere: Secondary | ICD-10-CM | POA: Diagnosis not present

## 2017-11-09 DIAGNOSIS — C3490 Malignant neoplasm of unspecified part of unspecified bronchus or lung: Secondary | ICD-10-CM

## 2017-11-09 LAB — CBC WITH DIFFERENTIAL/PLATELET
BASOS ABS: 29 {cells}/uL (ref 0–200)
Basophils Relative: 0.2 %
EOS ABS: 286 {cells}/uL (ref 15–500)
Eosinophils Relative: 2 %
HCT: 31.3 % — ABNORMAL LOW (ref 38.5–50.0)
HEMOGLOBIN: 9.4 g/dL — AB (ref 13.2–17.1)
Lymphs Abs: 215 cells/uL — ABNORMAL LOW (ref 850–3900)
MCH: 25.2 pg — AB (ref 27.0–33.0)
MCHC: 30 g/dL — AB (ref 32.0–36.0)
MCV: 83.9 fL (ref 80.0–100.0)
MONOS PCT: 6.3 %
MPV: 9.4 fL (ref 7.5–12.5)
NEUTROS ABS: 12870 {cells}/uL — AB (ref 1500–7800)
Neutrophils Relative %: 90 %
Platelets: 462 10*3/uL — ABNORMAL HIGH (ref 140–400)
RBC: 3.73 10*6/uL — ABNORMAL LOW (ref 4.20–5.80)
RDW: 15.4 % — ABNORMAL HIGH (ref 11.0–15.0)
TOTAL LYMPHOCYTE: 1.5 %
WBC mixed population: 901 cells/uL (ref 200–950)
WBC: 14.3 10*3/uL — ABNORMAL HIGH (ref 3.8–10.8)

## 2017-11-09 LAB — BASIC METABOLIC PANEL
BUN/Creatinine Ratio: 23 (calc) — ABNORMAL HIGH (ref 6–22)
BUN: 10 mg/dL (ref 7–25)
CALCIUM: 9.4 mg/dL (ref 8.6–10.3)
CHLORIDE: 89 mmol/L — AB (ref 98–110)
CO2: 40 mmol/L — AB (ref 20–32)
Creat: 0.44 mg/dL — ABNORMAL LOW (ref 0.70–1.33)
GLUCOSE: 95 mg/dL (ref 65–99)
Potassium: 4.1 mmol/L (ref 3.5–5.3)
SODIUM: 135 mmol/L (ref 135–146)

## 2017-11-09 NOTE — Progress Notes (Signed)
Patient ID: Levi Brown, male    DOB: 1956/05/15, 61 y.o.   MRN: [id number]  PCP: [name], FNP  Chief Complaint  Patient presents with  . Follow-up    4 WEEKS    Subjective:  HPI Levi Brown is a 61 y.o. male presents for a 4 week evaluation to monitor hemoglobin. Levi Brown suffers from non-small cell carcinoma stage 4. He was last seen in office approximately 4 weeks ago and had pronounced fatigue. Hemoglobin at that time was 7.9 and patient was advised to continue iron replacement tablets and follow-up today for routine monitor of anemia of chronic disease. Levi Brown is ambulatory today and is wearing his oxygen. Reports overall improvement of energy and appetite. Levi Brown completed radiation treatment in late September and has remained resisted to starting systemic chemotherapy as he feels treatment will lessens his overall quality of life. Reports that overall breathing quality has remained the same without worsening. He is consistently wearing his oxygen. He has no pain today. Denies any other complaints or concerns. Social History   Socioeconomic History  . Marital status: Single    Spouse name: Not on file  . Number of children: Not on file  . Years of education: Not on file  . Highest education level: Not on file  Social Needs  . Financial resource strain: Not on file  . Food insecurity - worry: Not on file  . Food insecurity - inability: Not on file  . Transportation needs - medical: Not on file  . Transportation needs - non-medical: Not on file  Occupational History  . Not on file  Tobacco Use  . Smoking status: Former Smoker    Packs/day: 1.00    Years: 30.00    Pack years: 30.00    Types: Cigarettes    Last attempt to quit: 2017    Years since quitting: 1.9  . Smokeless tobacco: Never Used  . Tobacco comment: quit smoking in 2017  Substance and Sexual Activity  . Alcohol use: Yes    Alcohol/week: 8.4 - 12.6 oz    Types: 14 - 21 Cans of beer per week   . Drug use: No  . Sexual activity: No  Other Topics Concern  . Not on file  Social History Narrative  . Not on file    Family History  Problem Relation Age of Onset  . Asthma Mother   . COPD Mother   . Cancer Mother        breast?   Review of Systems See HPI Patient Active Problem List   Diagnosis Date Noted  . Palliative care by specialist   . DNR (do not resuscitate) discussion   . Edema 08/31/2017  . Hypokalemia 08/29/2017  . Acute respiratory failure with hypoxia and hypercapnia ([place]) 08/26/2017  . Pleural effusion on left 08/26/2017  . Collapse of left lung 08/26/2017  . Hyponatremia 08/26/2017  . COPD exacerbation ([place]) 08/26/2017  . Obstructive pneumonia 08/26/2017  . Primary malignant neoplasm of lung metastatic to other site [facility])   . Non-small cell cancer of left lung ([place]) 07/13/2017  . Elevated blood pressure reading 07/05/2017    No Known Allergies  Prior to Admission medications   Medication Sig Start Date End Date Taking? Authorizing Provider  acetaminophen (TYLENOL) 325 MG tablet Take 325 mg by mouth every 6 (six) hours as needed for moderate pain.   Yes [provider]  albuterol (PROVENTIL HFA;VENTOLIN HFA) 108 (90 Base) MCG/ACT inhaler Inhale 2 puffs into  the lungs every 4 (four) hours as needed for wheezing or shortness of breath (cough, shortness of breath or wheezing.). 08/17/17  Yes Tresa Garter, MD  aspirin EC 81 MG tablet Take 1 tablet (81 mg total) by mouth daily. 07/08/17 07/08/18 Yes Florencia Reasons, MD  Dextromethorphan Polistirex (COUGH DM PO) Take 30 mLs by mouth daily as needed (cough).   Yes [provider]  feeding supplement (BOOST / RESOURCE BREEZE) LIQD Take 1 Container by mouth 3 (three) times daily between meals. 09/04/17  Yes Florencia Reasons, MD  ferrous sulfate (FERROUSUL) 325 (65 FE) MG tablet Take 1 tablet (325 mg total) by mouth 2 (two) times daily with a meal. 09/29/17  Yes [name], FNP  fluticasone  furoate-vilanterol (BREO ELLIPTA) 100-25 MCG/INH AEPB Inhale 1 puff into the lungs daily. 08/31/17  Yes Jani Gravel, MD  furosemide (LASIX) 20 MG tablet Take 1 tablet (20 mg total) by mouth daily as needed. 08/23/17  Yes [name], FNP  ipratropium-albuterol (DUONEB) 0.5-2.5 (3) MG/3ML SOLN Take 3 mLs by nebulization every 4 (four) hours as needed. 08/31/17  Yes Jani Gravel, MD  metoprolol tartrate (LOPRESSOR) 25 MG tablet Take 1 tablet (25 mg total) by mouth daily. 09/29/17  Yes [name], FNP  Multiple Vitamins-Minerals (DRY EYE FORMULA PO) Place 1 drop into both eyes daily.   Yes [provider]  ondansetron (ZOFRAN ODT) 4 MG disintegrating tablet Take 1 tablet (4 mg total) by mouth every 8 (eight) hours as needed for nausea or vomiting. 08/23/17  Yes [name], FNP  pantoprazole (PROTONIX) 40 MG tablet Take 1 tablet (40 mg total) by mouth at bedtime. 09/06/17  Yes [name], FNP  potassium chloride SA (K-DUR,KLOR-CON) 20 MEQ tablet Take 1 tablet (20 mEq total) by mouth daily. 08/17/17  Yes [name], FNP  sucralfate (CARAFATE) 1 GM/10ML suspension Take 10 mLs (1 g total) by mouth 3 (three) times daily with meals. 09/15/17  Yes [name], FNP  traZODone (DESYREL) 50 MG tablet Take 0.5-1 tablets (25-50 mg total) by mouth at bedtime as needed for sleep. 08/23/17  Yes [name], FNP  prochlorperazine (COMPAZINE) 10 MG tablet Take 1 tablet (10 mg total) by mouth every 6 (six) hours as needed for nausea or vomiting. Patient not taking: Reported on 11/03/2017 09/22/17   Curt Bears, MD  senna-docusate (SENOKOT-S) 8.6-50 MG tablet Take 1-2 tablets by mouth at bedtime. Patient not taking: Reported on 11/09/2017 09/15/17   [name], FNP    Past Medical, Surgical Family and Social History reviewed and updated.    Objective:   Today's Vitals   11/09/17 1124  BP: 110/78  Pulse: 90  Resp: 14  Temp: 98 F (36.7 C)  TempSrc:  Oral  SpO2: 100%  Weight: 152 lb 9.6 oz (69.2 kg)  Height: 5\' 5"  (1.651 m)    Wt Readings from Last 3 Encounters:  11/09/17 152 lb 9.6 oz (69.2 kg)  11/03/17 153 lb 6.4 oz (69.6 kg)  10/12/17 150 lb 12.8 oz (68.4 kg)    Physical Exam  Constitutional: He is oriented to person, place, and time. He appears well-developed and well-nourished.  HENT:  Head: Normocephalic.  Eyes: Conjunctivae are normal. Pupils are equal, round, and reactive to light.  Neck: Normal range of motion. Neck supple.  Pulmonary/Chest: Effort normal. He has decreased breath sounds. He has rhonchi.  Musculoskeletal: Normal range of motion.  Neurological: He is alert and oriented to  person, place, and time.  Skin: Skin is warm and dry.  Psychiatric: He has a normal mood and affect. His behavior is normal. Judgment and thought content normal.   Assessment & Plan:  1. Non-small cell lung cancer, unspecified laterality ([place]) 2. Anemia, chronic disease  Kanye appears overall stable today. He is negative of any respiratory stress. He is ambulating well without labored breathing. Will check CBC and BMP today. If hemoglobin less than 7 , I will consider transfusing 1 unit PRBC.     Carroll Sage. Kenton Kingfisher, MSN, FNP-C [facility]  [address] [place], [place] [address] [phone]

## 2017-11-10 ENCOUNTER — Encounter: Payer: Self-pay | Admitting: Oncology

## 2017-11-10 ENCOUNTER — Other Ambulatory Visit (HOSPITAL_BASED_OUTPATIENT_CLINIC_OR_DEPARTMENT_OTHER): Payer: Medicaid Other

## 2017-11-10 ENCOUNTER — Ambulatory Visit (HOSPITAL_BASED_OUTPATIENT_CLINIC_OR_DEPARTMENT_OTHER): Payer: Medicaid Other | Admitting: Oncology

## 2017-11-10 ENCOUNTER — Telehealth: Payer: Self-pay | Admitting: Oncology

## 2017-11-10 ENCOUNTER — Telehealth: Payer: Self-pay | Admitting: Family Medicine

## 2017-11-10 VITALS — BP 123/76 | Temp 99.5°F | Resp 18 | Ht 65.0 in | Wt 156.8 lb

## 2017-11-10 DIAGNOSIS — C7951 Secondary malignant neoplasm of bone: Secondary | ICD-10-CM

## 2017-11-10 DIAGNOSIS — C3412 Malignant neoplasm of upper lobe, left bronchus or lung: Secondary | ICD-10-CM

## 2017-11-10 DIAGNOSIS — Z79899 Other long term (current) drug therapy: Secondary | ICD-10-CM

## 2017-11-10 DIAGNOSIS — C3492 Malignant neoplasm of unspecified part of left bronchus or lung: Secondary | ICD-10-CM

## 2017-11-10 DIAGNOSIS — R5382 Chronic fatigue, unspecified: Secondary | ICD-10-CM

## 2017-11-10 LAB — CBC WITH DIFFERENTIAL/PLATELET
BASO%: 0.2 % (ref 0.0–2.0)
Basophils Absolute: 0 10*3/uL (ref 0.0–0.1)
EOS%: 2.4 % (ref 0.0–7.0)
Eosinophils Absolute: 0.3 10*3/uL (ref 0.0–0.5)
HCT: 31.8 % — ABNORMAL LOW (ref 38.4–49.9)
HGB: 9.2 g/dL — ABNORMAL LOW (ref 13.0–17.1)
LYMPH%: 4.8 % — AB (ref 14.0–49.0)
MCH: 24.9 pg — ABNORMAL LOW (ref 27.2–33.4)
MCHC: 28.9 g/dL — AB (ref 32.0–36.0)
MCV: 86.2 fL (ref 79.3–98.0)
MONO#: 0.4 10*3/uL (ref 0.1–0.9)
MONO%: 2.8 % (ref 0.0–14.0)
NEUT#: 11.9 10*3/uL — ABNORMAL HIGH (ref 1.5–6.5)
NEUT%: 89.8 % — AB (ref 39.0–75.0)
Platelets: 404 10*3/uL — ABNORMAL HIGH (ref 140–400)
RBC: 3.69 10*6/uL — AB (ref 4.20–5.82)
RDW: 17.7 % — ABNORMAL HIGH (ref 11.0–14.6)
WBC: 13.2 10*3/uL — ABNORMAL HIGH (ref 4.0–10.3)
lymph#: 0.6 10*3/uL — ABNORMAL LOW (ref 0.9–3.3)

## 2017-11-10 LAB — COMPREHENSIVE METABOLIC PANEL
ALT: 11 U/L (ref 0–55)
AST: 11 U/L (ref 5–34)
Albumin: 2.7 g/dL — ABNORMAL LOW (ref 3.5–5.0)
Alkaline Phosphatase: 66 U/L (ref 40–150)
Anion Gap: 8 mEq/L (ref 3–11)
BUN: 9.8 mg/dL (ref 7.0–26.0)
CHLORIDE: 89 meq/L — AB (ref 98–109)
CO2: 39 meq/L — AB (ref 22–29)
CREATININE: 0.6 mg/dL — AB (ref 0.7–1.3)
Calcium: 10.1 mg/dL (ref 8.4–10.4)
EGFR: >60 mL/min/{1.73_m2} (ref >60)
Glucose: 91 mg/dl (ref 70–140)
Potassium: 4.2 mEq/L (ref 3.5–5.1)
SODIUM: 136 meq/L (ref 136–145)
Total Bilirubin: 0.28 mg/dL (ref 0.20–1.20)
Total Protein: 7.7 g/dL (ref 6.4–8.3)

## 2017-11-10 LAB — TSH: TSH: 2.321 m(IU)/L (ref 0.320–4.118)

## 2017-11-10 NOTE — Telephone Encounter (Signed)
Contact Mr. Georgann Housekeeper to advise that his hemoglobin is stable and he should continue to take iron supplements as prescribed.  Advise him that white blood cell count is elevated, however this could be the early signs of an infection and or an inflammatory response. If he begins to feel poorly prior to his next scheduled follow-up in 1 month, advise him to contact the office sooner for evaluation and or go to the ED if after hours. Otherwise, Ill see him in 4 weeks.  Carroll Sage. Kenton Kingfisher, MSN, FNP-C [facility]  [address] [place] [place], [place] [address] [phone]

## 2017-11-10 NOTE — Assessment & Plan Note (Signed)
this is a very pleasant 61 year old African-American male with stage IV (T4, N3, M1c) non-small cell lung cancer, squamous cell carcinoma presented with left sided obstructing mediastinal mass with complete occlusion of the left upper lobe bronchus and postobstructive atelectasis of the left upper lobe as well as contralateral right paratracheal lymphadenopathy and hypermetabolic metastasis in the sternum as well as left pleural effusion diagnosed in July 2018 He underwent a course of palliative radiotherapy to the obstructive left mediastinal mass as well as the sternum. It was recommended for the patient to undergo systemic chemotherapy with carboplatin for an AUC of 5, paclitaxel 175 mg/m, and Keytruda 200 mg IV every 3 weeks.  The patient was scheduled to start, but he canceled his appointments because he did not want to proceed with chemotherapy.  The patient is now feeling stronger and is willing to consider treatment.  Patient was seen with Dr. Julien Nordmann.  We had a lengthy discussion with the patient and his sister again today about his current disease stage, prognosis, and treatment options.  The patient understands that he has an incurable condition and treatment would be palliative in nature.  We discussed recommendations again including systemic chemotherapy with carboplatin for an AUC of 5, paclitaxel 175 mg meter squared, and Keytruda 200 mg IV every 3 weeks.  We also discussed that the patient could consider palliative care/hospice.  The patient has restaging CT scans scheduled for later this week and wants to see the results before making any decisions.  We will plan to bring the patient back for follow-up next week to discuss these results and to discuss treatment options.  The patient should continue over-the-counter oral iron tablets 2 tablets by mouth daily for his persistent iron deficiency anemia.  He was advised to call immediately if he has any concerning symptoms in the interval. The  patient voices understanding of current disease status and treatment options and is in agreement with the current care plan.  All questions were answered. The patient knows to call the clinic with any problems, questions or concerns. We can certainly see the patient much sooner if necessary.

## 2017-11-10 NOTE — Progress Notes (Signed)
[name] OFFICE PROGRESS NOTE  [name], FNP [name] [address]  DIAGNOSIS: stage IV (T4, N3, M1c) non-small cell lung cancer, squamous cell carcinoma presented with left sided obstructing mediastinal mass with complete occlusion of the left upper lobe bronchus and postobstructive atelectasis of the left upper lobe as well as contralateral right paratracheal lymphadenopathy and hypermetabolic metastasis in the sternum as well as left pleural effusion diagnosed in July 2018  PRIOR THERAPY: status post a course of palliative radiotherapy to the large left mediastinal mass and the sternum under the care of Dr. Tammi Klippel.  CURRENT THERAPY: systemic chemotherapy was carboplatin for AUC of 5, paclitaxel 175 MG/M2 and Ketruda (pembrolizumab) 200 MG IV every 3 weeks. First dose was expected on 10/05/2017.  The patient canceled his appointments and wants to think about chemotherapy.  INTERVAL HISTORY: Levi Brown 61 y.o. male returns for routine follow-up visit accompanied by his sister.  The patient reports that he is feeling better since he completed his radiation.  He is now feeling stronger and is gaining back some of his lost weight.  His breathing has improved.  He denies fevers and chills.  Denies chest pain, shortness of breath, cough, hemoptysis.  Denies nausea, vomiting, constipation, diarrhea.  The patient is here for repeat lab work and to discuss treatment options.  MEDICAL HISTORY: Past Medical History:  Diagnosis Date  . Former smoker    Quit smoking in early 2017  . Non-small cell cancer of left lung ([place]) 07/13/2017  . Stroke ([place]) 07/07/2017   [place]    ALLERGIES:  has No Known Allergies.  MEDICATIONS:  Current Outpatient Medications  Medication Sig Dispense Refill  . acetaminophen (TYLENOL) 325 MG tablet Take 325 mg by mouth every 6 (six) hours as needed for moderate pain.    Marland Kitchen albuterol (PROVENTIL HFA;VENTOLIN HFA) 108 (90 Base)  MCG/ACT inhaler Inhale 2 puffs into the lungs every 4 (four) hours as needed for wheezing or shortness of breath (cough, shortness of breath or wheezing.). 54 g 3  . aspirin EC 81 MG tablet Take 1 tablet (81 mg total) by mouth daily. 30 tablet 0  . Dextromethorphan Polistirex (COUGH DM PO) Take 30 mLs by mouth daily as needed (cough).    . feeding supplement (BOOST / RESOURCE BREEZE) LIQD Take 1 Container by mouth 3 (three) times daily between meals. 30 Container 0  . ferrous sulfate (FERROUSUL) 325 (65 FE) MG tablet Take 1 tablet (325 mg total) by mouth 2 (two) times daily with a meal. 60 tablet 3  . fluticasone furoate-vilanterol (BREO ELLIPTA) 100-25 MCG/INH AEPB Inhale 1 puff into the lungs daily. 1 each 0  . furosemide (LASIX) 20 MG tablet Take 1 tablet (20 mg total) by mouth daily as needed. 30 tablet 1  . ipratropium-albuterol (DUONEB) 0.5-2.5 (3) MG/3ML SOLN Take 3 mLs by nebulization every 4 (four) hours as needed. 360 mL 0  . metoprolol tartrate (LOPRESSOR) 25 MG tablet Take 1 tablet (25 mg total) by mouth daily. 30 tablet 3  . Multiple Vitamins-Minerals (DRY EYE FORMULA PO) Place 1 drop into both eyes daily.    . ondansetron (ZOFRAN ODT) 4 MG disintegrating tablet Take 1 tablet (4 mg total) by mouth every 8 (eight) hours as needed for nausea or vomiting. 30 tablet 0  . pantoprazole (PROTONIX) 40 MG tablet Take 1 tablet (40 mg total) by mouth at bedtime. 30 tablet 3  . potassium chloride SA (K-DUR,KLOR-CON) 20 MEQ tablet  Take 1 tablet (20 mEq total) by mouth daily. 5 tablet 0  . prochlorperazine (COMPAZINE) 10 MG tablet Take 1 tablet (10 mg total) by mouth every 6 (six) hours as needed for nausea or vomiting. (Patient not taking: Reported on 11/03/2017) 30 tablet 0  . senna-docusate (SENOKOT-S) 8.6-50 MG tablet Take 1-2 tablets by mouth at bedtime. (Patient not taking: Reported on 11/09/2017) 60 tablet 0  . sucralfate (CARAFATE) 1 GM/10ML suspension Take 10 mLs (1 g total) by mouth 3 (three)  times daily with meals. 420 mL 0  . traZODone (DESYREL) 50 MG tablet Take 0.5-1 tablets (25-50 mg total) by mouth at bedtime as needed for sleep. 60 tablet 3   No current facility-administered medications for this visit.     SURGICAL HISTORY:  Past Surgical History:  Procedure Laterality Date  . VIDEO BRONCHOSCOPY Bilateral 07/07/2017   Procedure: VIDEO BRONCHOSCOPY WITHOUT FLUORO;  Surgeon: Collene Gobble, MD;  Location: [facility] ENDOSCOPY;  Service: Cardiopulmonary;  Laterality: Bilateral;    REVIEW OF SYSTEMS:   Review of Systems  Constitutional: Negative for appetite change, chills, fatigue, fever and unexpected weight change.  HENT:   Negative for mouth sores, nosebleeds, sore throat and trouble swallowing.   Eyes: Negative for eye problems and icterus.  Respiratory: Negative for cough, hemoptysis, shortness of breath and wheezing.   Cardiovascular: Negative for chest pain and leg swelling.  Gastrointestinal: Negative for abdominal pain, constipation, diarrhea, nausea and vomiting.  Genitourinary: Negative for bladder incontinence, difficulty urinating, dysuria, frequency and hematuria.   Musculoskeletal: Negative for back pain, gait problem, neck pain and neck stiffness.  Skin: Negative for itching and rash.  Neurological: Negative for dizziness, extremity weakness, gait problem, headaches, light-headedness and seizures.  Hematological: Negative for adenopathy. Does not bruise/bleed easily.  Psychiatric/Behavioral: Negative for confusion, depression and sleep disturbance. The patient is not nervous/anxious.     PHYSICAL EXAMINATION:  Blood pressure 123/76, temperature 99.5 F (37.5 C), temperature source Oral, resp. rate 18, height 5\' 5"  (1.651 m), weight 156 lb 12.8 oz (71.1 kg), SpO2 96 %.  ECOG PERFORMANCE STATUS: 1 - Symptomatic but completely ambulatory  Physical Exam  Constitutional: Oriented to person, place, and time and well-developed, well-nourished, and in no distress.  No distress.  HENT:  Head: Normocephalic and atraumatic.  Mouth/Throat: Oropharynx is clear and moist. No oropharyngeal exudate.  Eyes: Conjunctivae are normal. Right eye exhibits no discharge. Left eye exhibits no discharge. No scleral icterus.  Neck: Normal range of motion. Neck supple.  Cardiovascular: Normal rate, regular rhythm, normal heart sounds and intact distal pulses.   Pulmonary/Chest: Effort normal and breath sounds normal. No respiratory distress. No wheezes. No rales.  Abdominal: Soft. Bowel sounds are normal. Exhibits no distension and no mass. There is no tenderness.  Musculoskeletal: Normal range of motion. Exhibits no edema.  Lymphadenopathy:    No cervical adenopathy.  Neurological: Alert and oriented to person, place, and time. Exhibits normal muscle tone. Gait normal. Coordination normal.  Skin: Skin is warm and dry. No rash noted. Not diaphoretic. No erythema. No pallor.  Psychiatric: Mood, memory and judgment normal.  Vitals reviewed.  LABORATORY DATA: Lab Results  Component Value Date   WBC 13.2 (H) 11/10/2017   HGB 9.2 (L) 11/10/2017   HCT 31.8 (L) 11/10/2017   MCV 86.2 11/10/2017   PLT 404 (H) 11/10/2017      Chemistry      Component Value Date/Time   NA 136 11/10/2017 1023   K 4.2 11/10/2017 1023  CL 89 (L) 11/09/2017 1129   CO2 39 (H) 11/10/2017 1023   BUN 9.8 11/10/2017 1023   CREATININE 0.6 (L) 11/10/2017 1023      Component Value Date/Time   CALCIUM 10.1 11/10/2017 1023   ALKPHOS 66 11/10/2017 1023   AST 11 11/10/2017 1023   ALT 11 11/10/2017 1023   BILITOT 0.28 11/10/2017 1023       RADIOGRAPHIC STUDIES:  No results found.   ASSESSMENT/PLAN:  Non-small cell cancer of left lung [facility]) this is a very pleasant 61 year old African-American male with stage IV (T4, N3, M1c) non-small cell lung cancer, squamous cell carcinoma presented with left sided obstructing mediastinal mass with complete occlusion of the left upper lobe bronchus  and postobstructive atelectasis of the left upper lobe as well as contralateral right paratracheal lymphadenopathy and hypermetabolic metastasis in the sternum as well as left pleural effusion diagnosed in July 2018 He underwent a course of palliative radiotherapy to the obstructive left mediastinal mass as well as the sternum. It was recommended for the patient to undergo systemic chemotherapy with carboplatin for an AUC of 5, paclitaxel 175 mg/m, and Keytruda 200 mg IV every 3 weeks.  The patient was scheduled to start, but he canceled his appointments because he did not want to proceed with chemotherapy.  The patient is now feeling stronger and is willing to consider treatment.  Patient was seen with Dr. Julien Nordmann.  We had a lengthy discussion with the patient and his sister again today about his current disease stage, prognosis, and treatment options.  The patient understands that he has an incurable condition and treatment would be palliative in nature.  We discussed recommendations again including systemic chemotherapy with carboplatin for an AUC of 5, paclitaxel 175 mg meter squared, and Keytruda 200 mg IV every 3 weeks.  We also discussed that the patient could consider palliative care/hospice.  The patient has restaging CT scans scheduled for later this week and wants to see the results before making any decisions.  We will plan to bring the patient back for follow-up next week to discuss these results and to discuss treatment options.  The patient should continue over-the-counter oral iron tablets 2 tablets by mouth daily for his persistent iron deficiency anemia.  He was advised to call immediately if he has any concerning symptoms in the interval. The patient voices understanding of current disease status and treatment options and is in agreement with the current care plan.  All questions were answered. The patient knows to call the clinic with any problems, questions or concerns. We can  certainly see the patient much sooner if necessary.  No orders of the defined types were placed in this encounter.   Mikey Bussing, DNP, AGPCNP-BC, AOCNP 11/10/17  ADDENDUM: Hematology/Oncology Attending: I had a face-to-face encounter with the patient today.  I recommended his care plan.  This is a very pleasant 60 years old African-American male with stage IV non-small cell lung cancer, squamous cell carcinoma with large obstructive left mediastinal mass in addition to the contralateral lymphadenopathy and left pleural effusion.  The patient underwent a course of palliative radiotherapy to the postoperative mediastinal mass.  He has not received any previous systemic therapy.  I saw the patient a few weeks ago and discussed with him his treatment options including palliative care versus consideration of palliative systemic chemotherapy with carboplatin, paclitaxel and Keytruda.  The patient did not show up for his treatment as planned. He is still very reluctant about what  to do for his condition.  He is scheduled for repeat CT scan of the chest next week.  He would like to wait until he sees the scan results. I had a lengthy discussion with the patient and his sister today about his current condition and treatment options.  I explained to the patient that he has an incurable condition and all of the treatment will be of palliative nature.  He also understands that without any additional treatment his cancer is expected to progress. We would arrange for the patient to come back for follow-up visit in 1 week for discussion of his discuss results and treatment options. He was advised to call immediately if he has any other concerning symptoms in the interval.  Disclaimer: This note was dictated with voice recognition software. Similar sounding words can inadvertently be transcribed and may be missed upon review. Eilleen Kempf, MD 11/10/17

## 2017-11-10 NOTE — Telephone Encounter (Signed)
Patient notified and will contact us if needed

## 2017-11-10 NOTE — Telephone Encounter (Signed)
Gave avs and calendar for November and December

## 2017-11-11 ENCOUNTER — Telehealth: Payer: Self-pay | Admitting: *Deleted

## 2017-11-11 NOTE — Telephone Encounter (Signed)
CALLED PATIENT TO INFORM OF SCAN ON 11-17-17 @ [facility]- ARRIVAL TIME - 12:45 PM @ [facility], PT. TO BE NPO- 4 HRS. PRIOR TO TEST , PT. TO PICK -UP DRINK FOR SCAN ONE DAY PRIOR TO SCAN

## 2017-11-12 ENCOUNTER — Encounter: Payer: Self-pay | Admitting: Family Medicine

## 2017-11-12 ENCOUNTER — Telehealth: Payer: Self-pay | Admitting: Family Medicine

## 2017-11-12 ENCOUNTER — Encounter: Payer: Self-pay | Admitting: *Deleted

## 2017-11-12 ENCOUNTER — Ambulatory Visit (HOSPITAL_COMMUNITY): Payer: Medicaid Other

## 2017-11-12 ENCOUNTER — Ambulatory Visit (INDEPENDENT_AMBULATORY_CARE_PROVIDER_SITE_OTHER): Payer: Medicaid Other | Admitting: Family Medicine

## 2017-11-12 VITALS — BP 104/60 | HR 100 | Temp 98.1°F | Ht 65.0 in

## 2017-11-12 DIAGNOSIS — D72829 Elevated white blood cell count, unspecified: Secondary | ICD-10-CM

## 2017-11-12 DIAGNOSIS — J069 Acute upper respiratory infection, unspecified: Secondary | ICD-10-CM | POA: Diagnosis not present

## 2017-11-12 DIAGNOSIS — C3412 Malignant neoplasm of upper lobe, left bronchus or lung: Secondary | ICD-10-CM

## 2017-11-12 DIAGNOSIS — C3490 Malignant neoplasm of unspecified part of unspecified bronchus or lung: Secondary | ICD-10-CM

## 2017-11-12 LAB — CBC WITH DIFFERENTIAL/PLATELET
BASOS PCT: 0.3 %
Basophils Absolute: 35 cells/uL (ref 0–200)
EOS ABS: 538 {cells}/uL — AB (ref 15–500)
Eosinophils Relative: 4.6 %
HCT: 30.7 % — ABNORMAL LOW (ref 38.5–50.0)
HEMOGLOBIN: 9.2 g/dL — AB (ref 13.2–17.1)
Lymphs Abs: 374 cells/uL — ABNORMAL LOW (ref 850–3900)
MCH: 25.3 pg — AB (ref 27.0–33.0)
MCHC: 30 g/dL — ABNORMAL LOW (ref 32.0–36.0)
MCV: 84.3 fL (ref 80.0–100.0)
MPV: 9.3 fL (ref 7.5–12.5)
Monocytes Relative: 10.3 %
Neutro Abs: 9547 cells/uL — ABNORMAL HIGH (ref 1500–7800)
Neutrophils Relative %: 81.6 %
PLATELETS: 436 10*3/uL — AB (ref 140–400)
RBC: 3.64 10*6/uL — ABNORMAL LOW (ref 4.20–5.80)
RDW: 15.1 % — ABNORMAL HIGH (ref 11.0–15.0)
TOTAL LYMPHOCYTE: 3.2 %
WBC: 11.7 10*3/uL — ABNORMAL HIGH (ref 3.8–10.8)
WBCMIX: 1205 {cells}/uL — AB (ref 200–950)

## 2017-11-12 LAB — BASIC METABOLIC PANEL
BUN / CREAT RATIO: 32 (calc) — AB (ref 6–22)
BUN: 12 mg/dL (ref 7–25)
CHLORIDE: 90 mmol/L — AB (ref 98–110)
CO2: 43 mmol/L — AB (ref 20–32)
Calcium: 9.3 mg/dL (ref 8.6–10.3)
Creat: 0.37 mg/dL — ABNORMAL LOW (ref 0.70–1.33)
Glucose, Bld: 117 mg/dL — ABNORMAL HIGH (ref 65–99)
Potassium: 4.5 mmol/L (ref 3.5–5.3)
Sodium: 137 mmol/L (ref 135–146)

## 2017-11-12 MED ORDER — PROMETHAZINE-CODEINE 6.25-10 MG/5ML PO SYRP: 5.0000 mL | ORAL_SOLUTION | Freq: Four times a day (QID) | ORAL | 0 refills | Status: AC | PRN

## 2017-11-12 MED ORDER — ALBUTEROL SULFATE (2.5 MG/3ML) 0.083% IN NEBU: 2.5000 mg | INHALATION_SOLUTION | Freq: Once | RESPIRATORY_TRACT | Status: DC

## 2017-11-12 MED ORDER — AZITHROMYCIN 250 MG PO TABS: ORAL_TABLET | ORAL | 0 refills | Status: AC

## 2017-11-12 NOTE — Telephone Encounter (Signed)
Contact patient to advise that his labs are stable. White blood count and hemoglobin have improved since last visit. Continue antibiotic medication as prescribed.   Carroll Sage. Kenton Kingfisher, MSN, FNP-C [facility]  [address] C[address] [place], [place] [address] [phone]

## 2017-11-12 NOTE — Progress Notes (Signed)
Patient ID: Levi Brown, male    DOB: 06-05-1956, 61 y.o.   MRN: [id number]  PCP: [name], FNP  Chief Complaint  Patient presents with  . Follow-up    Subjective:  HPI Levi Brown is a 61 y.o. male presents for evaluation of fatigue, shortness of breath, and generalized muscle weakness, Medical history significant for malignant non-small cell cancer (currently not receiving treatment). He reports over the course of last two days, experiencing sweating episodes, worsening weakness, cough (productive of clear sputum), dyspnea,    and feeling warm without a measurable fever. Today, he has experienced weakness to the point of requiring use of wheelchair to ambulate. He continues routine nebulizer treatments and requires continuous 2 liters of oxygen via nasal cannula. Mattie was seen office on 11/09/2017 for routine monthly follow-up and was found to have leucocytosis. During visit on 11/09/2017, he reported feeling well, afebrile, and negative for signs of infection. He was advised to return to office if any of the aforementioned symptoms occurred.  Social History   Socioeconomic History  . Marital status: Single    Spouse name: Not on file  . Number of children: Not on file  . Years of education: Not on file  . Highest education level: Not on file  Social Needs  . Financial resource strain: Not on file  . Food insecurity - worry: Not on file  . Food insecurity - inability: Not on file  . Transportation needs - medical: Not on file  . Transportation needs - non-medical: Not on file  Occupational History  . Not on file  Tobacco Use  . Smoking status: Former Smoker    Packs/day: 1.00    Years: 30.00    Pack years: 30.00    Types: Cigarettes    Last attempt to quit: 2017    Years since quitting: 1.9  . Smokeless tobacco: Never Used  . Tobacco comment: quit smoking in 2017  Substance and Sexual Activity  . Alcohol use: Yes    Alcohol/week: 8.4 - 12.6 oz     Types: 14 - 21 Cans of beer per week  . Drug use: No  . Sexual activity: No  Other Topics Concern  . Not on file  Social History Narrative  . Not on file    Family History  Problem Relation Age of Onset  . Asthma Mother   . COPD Mother   . Cancer Mother        breast?    Review of Systems  Constitutional: Positive for activity change, diaphoresis and fatigue.  Respiratory: Positive for cough and shortness of breath.   Cardiovascular: Negative.   Gastrointestinal: Negative.   Neurological: Negative.   Hematological: Negative.   Psychiatric/Behavioral: Negative.     Patient Active Problem List   Diagnosis Date Noted  . Palliative care by specialist   . DNR (do not resuscitate) discussion   . Edema 08/31/2017  . Hypokalemia 08/29/2017  . Acute respiratory failure with hypoxia and hypercapnia ([place]) 08/26/2017  . Pleural effusion on left 08/26/2017  . Collapse of left lung 08/26/2017  . Hyponatremia 08/26/2017  . COPD exacerbation ([place]) 08/26/2017  . Obstructive pneumonia 08/26/2017  . Primary malignant neoplasm of lung metastatic to other site [facility])   . Non-small cell cancer of left lung ([place]) 07/13/2017  . Elevated blood pressure reading 07/05/2017    No Known Allergies  Prior to Admission medications   Medication Sig Start Date End Date Taking? Authorizing Provider  acetaminophen (TYLENOL) 325 MG tablet Take 325 mg by mouth every 6 (six) hours as needed for moderate pain.   Yes [provider]  albuterol (PROVENTIL HFA;VENTOLIN HFA) 108 (90 Base) MCG/ACT inhaler Inhale 2 puffs into the lungs every 4 (four) hours as needed for wheezing or shortness of breath (cough, shortness of breath or wheezing.). 08/17/17  Yes Tresa Garter, MD  aspirin EC 81 MG tablet Take 1 tablet (81 mg total) by mouth daily. 07/08/17 07/08/18 Yes Florencia Reasons, MD  Dextromethorphan Polistirex (COUGH DM PO) Take 30 mLs by mouth daily as needed (cough).   Yes [provider]   feeding supplement (BOOST / RESOURCE BREEZE) LIQD Take 1 Container by mouth 3 (three) times daily between meals. 09/04/17  Yes Florencia Reasons, MD  ferrous sulfate (FERROUSUL) 325 (65 FE) MG tablet Take 1 tablet (325 mg total) by mouth 2 (two) times daily with a meal. 09/29/17  Yes [name], FNP  fluticasone furoate-vilanterol (BREO ELLIPTA) 100-25 MCG/INH AEPB Inhale 1 puff into the lungs daily. 08/31/17  Yes Jani Gravel, MD  furosemide (LASIX) 20 MG tablet Take 1 tablet (20 mg total) by mouth daily as needed. 08/23/17  Yes [name], FNP  ipratropium-albuterol (DUONEB) 0.5-2.5 (3) MG/3ML SOLN Take 3 mLs by nebulization every 4 (four) hours as needed. 08/31/17  Yes Jani Gravel, MD  metoprolol tartrate (LOPRESSOR) 25 MG tablet Take 1 tablet (25 mg total) by mouth daily. 09/29/17  Yes [name], FNP  Multiple Vitamins-Minerals (DRY EYE FORMULA PO) Place 1 drop into both eyes daily.   Yes [provider]  ondansetron (ZOFRAN ODT) 4 MG disintegrating tablet Take 1 tablet (4 mg total) by mouth every 8 (eight) hours as needed for nausea or vomiting. 08/23/17  Yes [name], FNP  pantoprazole (PROTONIX) 40 MG tablet Take 1 tablet (40 mg total) by mouth at bedtime. 09/06/17  Yes [name], FNP  potassium chloride SA (K-DUR,KLOR-CON) 20 MEQ tablet Take 1 tablet (20 mEq total) by mouth daily. 08/17/17  Yes [name], FNP  prochlorperazine (COMPAZINE) 10 MG tablet Take 1 tablet (10 mg total) by mouth every 6 (six) hours as needed for nausea or vomiting. 09/22/17  Yes Curt Bears, MD  senna-docusate (SENOKOT-S) 8.6-50 MG tablet Take 1-2 tablets by mouth at bedtime. 09/15/17  Yes [name], FNP  sucralfate (CARAFATE) 1 GM/10ML suspension Take 10 mLs (1 g total) by mouth 3 (three) times daily with meals. 09/15/17  Yes [name], FNP  traZODone (DESYREL) 50 MG tablet Take 0.5-1 tablets (25-50 mg total) by mouth at bedtime as needed for sleep.  08/23/17  Yes [name], FNP    Past Medical, Surgical Family and Social History reviewed and updated.    Objective:   Today's Vitals   11/12/17 1136  BP: 104/60  Pulse: 100  Temp: 98.1 F (36.7 C)  TempSrc: Oral  SpO2: 96%  Height: 5\' 5"  (1.651 m)    Wt Readings from Last 3 Encounters:  11/10/17 156 lb 12.8 oz (71.1 kg)  11/09/17 152 lb 9.6 oz (69.2 kg)  11/03/17 153 lb 6.4 oz (69.6 kg)    Physical Exam Physical Exam: Constitutional: Patient is ill-appearing. No distress. HENT: Normocephalic, atraumatic, External right and left ear normal. Oropharynx is clear and moist.  Eyes: Conjunctivae and EOM are normal. PERRLA, no scleral icterus. Neck: Normal ROM. Neck supple. No JVD. No tracheal deviation. No thyromegaly. CVS: RRR, S1/S2 +, no murmurs,  no gallops, no carotid bruit.  Pulmonary: Effort normal. Positive for wheezes upper bilateral bronchials. Abdominal: Soft. BS +, no distension, tenderness, rebound or guarding.  Musculoskeletal: Normal range of motion. No edema and no tenderness.  Lymphadenopathy: No lymphadenopathy noted, cervical, inguinal or axillary Neuro: Alert. Normal reflexes, muscle tone coordination. No cranial nerve deficit. Skin: Skin is warm and dry. No rash noted. Not diaphoretic. No erythema. No pallor. Psychiatric: Normal mood and affect. Behavior, judgment, thought content normal.    Assessment & Plan:  1. Leukocytosis, unspecified type, will obtain a repeat CBC today. Suspect patient may have upper respiratory infection secondary to non-small cell lung cancer. Obtaining CBC today. Will cover patient with broad-spectrum antibiotic and have him return for follow-up in 2 weeks.  2. Non-small cell lung cancer, unspecified laterality ([place]) -Currently followed by [facility], although he is not at present receiving chemotherapy or radiation. Next follow-up scheduled for 11/18/2017.   3. Upper Respiratory Infection, Will cover patient with  broad-spectrum antibiotic and antitussive for cough. Continue oxygen therapy.   Meds ordered this encounter  Medications  . albuterol (PROVENTIL) (2.5 MG/3ML) 0.083% nebulizer solution 2.5 mg  . azithromycin (ZITHROMAX) 250 MG tablet    Sig: Take 2 tabs PO x 1 dose, then 1 tab PO QD x 4 days    Dispense:  6 tablet    Refill:  0    Order Specific Question:   Supervising Provider    Answer:   Tresa Garter [id number]  . promethazine-codeine (PHENERGAN WITH CODEINE) 6.25-10 MG/5ML syrup    Sig: Take 5 mLs by mouth every 6 (six) hours as needed for cough.    Dispense:  120 mL    Refill:  0    Order Specific Question:   Supervising Provider    Answer:   Tresa Garter [[id number]]    Orders Placed This Encounter  Procedures  . CBC with Differential  . Basic metabolic panel      -The patient was given clear instructions to go to ER or return to medical center if symptoms do not improve, worsen or new problems develop. The patient verbalized understanding.  Carroll Sage. Kenton Kingfisher, MSN, FNP-C [facility]  [address] [employer] [place], [place] [address] [phone]

## 2017-11-12 NOTE — Patient Instructions (Addendum)
Start Azithromycin Take 2 tabs x 1 dose, then 1 tab every day for x 4 days.  For cough, start Phenergan- Codeine 5 ml every 6 hours as needed for cough.    Acute Bronchitis, Adult Acute bronchitis is when air tubes (bronchi) in the lungs suddenly get swollen. The condition can make it hard to breathe. It can also cause these symptoms:  A cough.  Coughing up clear, yellow, or green mucus.  Wheezing.  Chest congestion.  Shortness of breath.  A fever.  Body aches.  Chills.  A sore throat.  Follow these instructions at home: Medicines  Take over-the-counter and prescription medicines only as told by your doctor.  If you were prescribed an antibiotic medicine, take it as told by your doctor. Do not stop taking the antibiotic even if you start to feel better. General instructions  Rest.  Drink enough fluids to keep your pee (urine) clear or pale yellow.  Avoid smoking and secondhand smoke. If you smoke and you need help quitting, ask your doctor. Quitting will help your lungs heal faster.  Use an inhaler, cool mist vaporizer, or humidifier as told by your doctor.  Keep all follow-up visits as told by your doctor. This is important. How is this prevented? To lower your risk of getting this condition again:  Wash your hands often with soap and water. If you cannot use soap and water, use hand sanitizer.  Avoid contact with people who have cold symptoms.  Try not to touch your hands to your mouth, nose, or eyes.  Make sure to get the flu shot every year.  Contact a doctor if:  Your symptoms do not get better in 2 weeks. Get help right away if:  You cough up blood.  You have chest pain.  You have very bad shortness of breath.  You become dehydrated.  You faint (pass out) or keep feeling like you are going to pass out.  You keep throwing up (vomiting).  You have a very bad headache.  Your fever or chills gets worse. This information is not intended to  replace advice given to you by your health care provider. Make sure you discuss any questions you have with your health care provider. Document Released: 05/18/2008 Document Revised: 07/08/2016 Document Reviewed: 05/20/2016 Elsevier Interactive Patient Education  2017 [employer].

## 2017-11-12 NOTE — Telephone Encounter (Signed)
Patient notified

## 2017-11-12 NOTE — Progress Notes (Signed)
Oncology Nurse Navigator Documentation  Oncology Nurse Navigator Flowsheets 11/12/2017  Navigator Location [facility]  Navigator Encounter Type Other/I followed up on Mr. Duren's schedule. He does not have labs scheduled before his appt with Erasmo Downer on 12/6.  I completed scheduling note to call and schedule labs 30 minutes before Erasmo Downer sees patient.    Treatment Phase Follow-up  Barriers/Navigation Needs Coordination of Care  Interventions Coordination of Care  Coordination of Care Other  Acuity Level 1  Time Spent with Patient 30

## 2017-11-13 ENCOUNTER — Emergency Department (HOSPITAL_COMMUNITY)
Admission: EM | Admit: 2017-11-13 | Discharge: 2017-11-13 | Disposition: A | Discharge status: Home / Self Care | Payer: Medicaid Other | Attending: Emergency Medicine | Admitting: Emergency Medicine

## 2017-11-13 ENCOUNTER — Emergency Department (HOSPITAL_COMMUNITY): Payer: Medicaid Other

## 2017-11-13 ENCOUNTER — Encounter (HOSPITAL_COMMUNITY): Payer: Self-pay | Admitting: Emergency Medicine

## 2017-11-13 DIAGNOSIS — Z79899 Other long term (current) drug therapy: Secondary | ICD-10-CM | POA: Diagnosis not present

## 2017-11-13 DIAGNOSIS — R0602 Shortness of breath: Secondary | ICD-10-CM | POA: Diagnosis present

## 2017-11-13 DIAGNOSIS — C3492 Malignant neoplasm of unspecified part of left bronchus or lung: Secondary | ICD-10-CM | POA: Insufficient documentation

## 2017-11-13 DIAGNOSIS — Z87891 Personal history of nicotine dependence: Secondary | ICD-10-CM | POA: Diagnosis not present

## 2017-11-13 DIAGNOSIS — J449 Chronic obstructive pulmonary disease, unspecified: Secondary | ICD-10-CM | POA: Diagnosis not present

## 2017-11-13 DIAGNOSIS — Z7982 Long term (current) use of aspirin: Secondary | ICD-10-CM | POA: Insufficient documentation

## 2017-11-13 DIAGNOSIS — J9621 Acute and chronic respiratory failure with hypoxia: Secondary | ICD-10-CM | POA: Diagnosis not present

## 2017-11-13 LAB — CBC WITH DIFFERENTIAL/PLATELET
BASOS PCT: 0 %
Basophils Absolute: 0 10*3/uL (ref 0.0–0.1)
EOS ABS: 0.1 10*3/uL (ref 0.0–0.7)
Eosinophils Relative: 1 %
HEMATOCRIT: 35.3 % — AB (ref 39.0–52.0)
HEMOGLOBIN: 10.3 g/dL — AB (ref 13.0–17.0)
Lymphocytes Relative: 3 %
Lymphs Abs: 0.5 10*3/uL — ABNORMAL LOW (ref 0.7–4.0)
MCH: 25.7 pg — ABNORMAL LOW (ref 26.0–34.0)
MCHC: 29.2 g/dL — AB (ref 30.0–36.0)
MCV: 88 fL (ref 78.0–100.0)
MONO ABS: 0.3 10*3/uL (ref 0.1–1.0)
Monocytes Relative: 2 %
NEUTROS ABS: 16.4 10*3/uL — AB (ref 1.7–7.7)
Neutrophils Relative %: 94 %
Platelets: 480 10*3/uL — ABNORMAL HIGH (ref 150–400)
RBC: 4.01 MIL/uL — ABNORMAL LOW (ref 4.22–5.81)
RDW: 17.5 % — AB (ref 11.5–15.5)
WBC: 17.3 10*3/uL — ABNORMAL HIGH (ref 4.0–10.5)

## 2017-11-13 LAB — I-STAT TROPONIN, ED
TROPONIN I, POC: 0 ng/mL (ref 0.00–0.08)
Troponin i, poc: 0 ng/mL (ref 0.00–0.08)

## 2017-11-13 LAB — COMPREHENSIVE METABOLIC PANEL
ALBUMIN: 3.2 g/dL — AB (ref 3.5–5.0)
ALK PHOS: 85 U/L (ref 38–126)
ALT: 26 U/L (ref 17–63)
ANION GAP: 8 (ref 5–15)
AST: 28 U/L (ref 15–41)
BUN: 12 mg/dL (ref 6–20)
CALCIUM: 9.7 mg/dL (ref 8.9–10.3)
CO2: 42 mmol/L — ABNORMAL HIGH (ref 22–32)
Chloride: 85 mmol/L — ABNORMAL LOW (ref 101–111)
Creatinine, Ser: 0.5 mg/dL — ABNORMAL LOW (ref 0.61–1.24)
GFR calc Af Amer: >60 mL/min (ref >60)
GLUCOSE: 118 mg/dL — AB (ref 65–99)
POTASSIUM: 4.4 mmol/L (ref 3.5–5.1)
Sodium: 135 mmol/L (ref 135–145)
TOTAL PROTEIN: 8.7 g/dL — AB (ref 6.5–8.1)
Total Bilirubin: 0.2 mg/dL — ABNORMAL LOW (ref 0.3–1.2)

## 2017-11-13 LAB — BRAIN NATRIURETIC PEPTIDE: B Natriuretic Peptide: 32.9 pg/mL (ref 0.0–100.0)

## 2017-11-13 MED ORDER — IOPAMIDOL (ISOVUE-370) INJECTION 76%
100.0000 mL | Freq: Once | INTRAVENOUS | Status: AC | PRN
  Administered 2017-11-13: 100 mL via INTRAVENOUS

## 2017-11-13 MED ORDER — IOPAMIDOL (ISOVUE-370) INJECTION 76%
INTRAVENOUS | Status: AC
  Filled 2017-11-13: qty 100

## 2017-11-13 NOTE — ED Notes (Signed)
Pt stayed at 100% during amublation.

## 2017-11-13 NOTE — ED Notes (Signed)
Bed: WA08 Expected date: 11/13/17 Expected time:  Means of arrival:  Comments:
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
## 2017-11-13 NOTE — ED Provider Notes (Signed)
[facility] DEPT Provider Note   CSN: [id number] Arrival date & time: 11/13/17  1137     History   Chief Complaint Chief Complaint  Patient presents with  . Shortness of Breath    HPI Levi Brown is a 61 y.o. male.  HPI   61 year old male with history of non-small cell lung cancer, CVA, presents with concern for shortness of breath.  Reports he had days of shortness of breath, and was seen yesterday and prescribed cough medicine with codeine, however about 1 hour ago his shortness of breath significantly increased.  Patient was noted to have hypoxia on arrival of EMS on his home liters of 2-3.  Improved on 4 L prior to arrival.  Patient reports some mild cough, but denies significant increased cough.  Denies known fevers.  Denies chest pain.  Denies leg pain or swelling.  Denies history of pulmonary embolus.  Reports he has chronic dyspnea when laying down flat secondary to his tumor burden, and this is unchanged.  Past Medical History:  Diagnosis Date  . Former smoker    Quit smoking in early 2017  . Non-small cell cancer of left lung ([place]) 07/13/2017  . Stroke [facility]) 07/07/2017   [place]    Patient Active Problem List   Diagnosis Date Noted  . Palliative care by specialist   . DNR (do not resuscitate) discussion   . Edema 08/31/2017  . Hypokalemia 08/29/2017  . Acute respiratory failure with hypoxia and hypercapnia ([place]) 08/26/2017  . Pleural effusion on left 08/26/2017  . Collapse of left lung 08/26/2017  . Hyponatremia 08/26/2017  . COPD exacerbation ([place]) 08/26/2017  . Obstructive pneumonia 08/26/2017  . Primary malignant neoplasm of lung metastatic to other site [facility])   . Non-small cell cancer of left lung ([place]) 07/13/2017  . Elevated blood pressure reading 07/05/2017    Past Surgical History:  Procedure Laterality Date  . VIDEO BRONCHOSCOPY Bilateral 07/07/2017   Procedure: VIDEO BRONCHOSCOPY WITHOUT FLUORO;  Surgeon: Collene Gobble, MD;  Location: [facility] ENDOSCOPY;  Service: Cardiopulmonary;  Laterality: Bilateral;       Home Medications    Prior to Admission medications   Medication Sig Start Date End Date Taking? Authorizing Provider  aspirin EC 81 MG tablet Take 1 tablet (81 mg total) by mouth daily. 07/08/17 07/08/18 Yes Florencia Reasons, MD  Dextromethorphan Polistirex (COUGH DM PO) Take 30 mLs by mouth daily as needed (cough).   Yes [provider]  feeding supplement (BOOST / RESOURCE BREEZE) LIQD Take 1 Container by mouth 3 (three) times daily between meals. 09/04/17  Yes Florencia Reasons, MD  ferrous sulfate (FERROUSUL) 325 (65 FE) MG tablet Take 1 tablet (325 mg total) by mouth 2 (two) times daily with a meal. 09/29/17  Yes [name], FNP  ipratropium-albuterol (DUONEB) 0.5-2.5 (3) MG/3ML SOLN Take 3 mLs by nebulization every 4 (four) hours as needed. 08/31/17  Yes Jani Gravel, MD  metoprolol tartrate (LOPRESSOR) 25 MG tablet Take 1 tablet (25 mg total) by mouth daily. 09/29/17  Yes [name], FNP  Multiple Vitamins-Minerals (DRY EYE FORMULA PO) Place 1 drop into both eyes daily.   Yes [provider]  predniSONE (DELTASONE) 20 MG tablet Take 20 mg by mouth daily with breakfast. 11/08/17  Yes [provider]  senna-docusate (SENOKOT-S) 8.6-50 MG tablet Take 1-2 tablets by mouth at bedtime. 09/15/17  Yes [name], FNP  albuterol (PROVENTIL HFA;VENTOLIN HFA) 108 (90 Base) MCG/ACT inhaler Inhale  2 puffs into the lungs every 4 (four) hours as needed for wheezing or shortness of breath (cough, shortness of breath or wheezing.). Patient not taking: Reported on 11/13/2017 08/17/17   Tresa Garter, MD  azithromycin (ZITHROMAX) 250 MG tablet Take 2 tabs PO x 1 dose, then 1 tab PO QD x 4 days 11/12/17   [name], FNP  fluticasone furoate-vilanterol (BREO ELLIPTA) 100-25 MCG/INH AEPB Inhale 1 puff into the lungs daily. Patient not taking: Reported on 11/13/2017 08/31/17    Jani Gravel, MD  furosemide (LASIX) 20 MG tablet Take 1 tablet (20 mg total) by mouth daily as needed. Patient not taking: Reported on 11/13/2017 08/23/17   [name], FNP  ondansetron (ZOFRAN ODT) 4 MG disintegrating tablet Take 1 tablet (4 mg total) by mouth every 8 (eight) hours as needed for nausea or vomiting. Patient not taking: Reported on 11/13/2017 08/23/17   [name], FNP  pantoprazole (PROTONIX) 40 MG tablet Take 1 tablet (40 mg total) by mouth at bedtime. Patient not taking: Reported on 11/13/2017 09/06/17   [name], FNP  potassium chloride SA (K-DUR,KLOR-CON) 20 MEQ tablet Take 1 tablet (20 mEq total) by mouth daily. Patient not taking: Reported on 11/13/2017 08/17/17   [name], FNP  prochlorperazine (COMPAZINE) 10 MG tablet Take 1 tablet (10 mg total) by mouth every 6 (six) hours as needed for nausea or vomiting. Patient not taking: Reported on 11/13/2017 09/22/17   Curt Bears, MD  promethazine-codeine [facility] WITH CODEINE) 6.25-10 MG/5ML syrup Take 5 mLs by mouth every 6 (six) hours as needed for cough. Patient not taking: Reported on 11/13/2017 11/12/17   [name], FNP  sucralfate (CARAFATE) 1 GM/10ML suspension Take 10 mLs (1 g total) by mouth 3 (three) times daily with meals. Patient not taking: Reported on 11/13/2017 09/15/17   [name], FNP  traZODone (DESYREL) 50 MG tablet Take 0.5-1 tablets (25-50 mg total) by mouth at bedtime as needed for sleep. Patient not taking: Reported on 11/13/2017 08/23/17   [name], FNP    Family History Family History  Problem Relation Age of Onset  . Asthma Mother   . COPD Mother   . Cancer Mother        breast?    Social History Social History   Tobacco Use  . Smoking status: Former Smoker    Packs/day: 1.00    Years: 30.00    Pack years: 30.00    Types: Cigarettes    Last attempt to quit: 2017    Years since quitting: 1.9  . Smokeless tobacco: Never Used  .  Tobacco comment: quit smoking in 2017  Substance Use Topics  . Alcohol use: Yes    Alcohol/week: 8.4 - 12.6 oz    Types: 14 - 21 Cans of beer per week  . Drug use: No     Allergies   Patient has no known allergies.   Review of Systems Review of Systems  Constitutional: Negative for fever.  HENT: Negative for sore throat.   Eyes: Negative for visual disturbance.  Respiratory: Positive for cough (mild not significant increased) and shortness of breath.   Cardiovascular: Negative for chest pain.  Gastrointestinal: Negative for abdominal pain, nausea and vomiting.  Genitourinary: Negative for difficulty urinating.  Musculoskeletal: Negative for back pain and neck stiffness.  Skin: Negative for rash.  Neurological: Negative for syncope and headaches.     Physical Exam Updated Vital Signs BP 130/88  Pulse 100   Temp 98.8 F (37.1 C) (Oral)   Resp 20   Ht 5\' 5"  (1.651 m)   Wt 70.8 kg (156 lb)   SpO2 99%   BMI 25.96 kg/m   Physical Exam  Constitutional: He is oriented to person, place, and time. He appears well-developed and well-nourished. No distress.  HENT:  Head: Normocephalic and atraumatic.  Eyes: Conjunctivae and EOM are normal.  Neck: Normal range of motion.  Cardiovascular: Normal rate, regular rhythm, normal heart sounds and intact distal pulses. Exam reveals no gallop and no friction rub.  No murmur heard. Pulmonary/Chest: Effort normal and breath sounds normal. No respiratory distress. He has no wheezes. He has no rales.  Abdominal: Soft. He exhibits no distension. There is no tenderness. There is no guarding.  Musculoskeletal: He exhibits no edema.  Neurological: He is alert and oriented to person, place, and time.  Skin: Skin is warm and dry. He is not diaphoretic.  Nursing note and vitals reviewed.    ED Treatments / Results  Labs (all labs ordered are listed, but only abnormal results are displayed) Labs Reviewed  CBC WITH DIFFERENTIAL/PLATELET -  Abnormal; Notable for the following components:      Result Value   WBC 17.3 (*)    RBC 4.01 (*)    Hemoglobin 10.3 (*)    HCT 35.3 (*)    MCH 25.7 (*)    MCHC 29.2 (*)    RDW 17.5 (*)    Platelets 480 (*)    Neutro Abs 16.4 (*)    Lymphs Abs 0.5 (*)    All other components within normal limits  COMPREHENSIVE METABOLIC PANEL - Abnormal; Notable for the following components:   Chloride 85 (*)    CO2 42 (*)    Glucose, Bld 118 (*)    Creatinine, Ser 0.50 (*)    Total Protein 8.7 (*)    Albumin 3.2 (*)    Total Bilirubin 0.2 (*)    All other components within normal limits  BRAIN NATRIURETIC PEPTIDE  I-STAT TROPONIN, ED  I-STAT TROPONIN, ED    EKG  EKG Interpretation  Date/Time:  Saturday November 13 2017 11:51:50 EST Ventricular Rate:  106 PR Interval:    QRS Duration: 80 QT Interval:  299 QTC Calculation: 397 R Axis:   71 Text Interpretation:  Sinus tachycardia Biatrial enlargement Abnormal R-wave progression, early transition Borderline ST elevation, anterior leads Lateral leads are also involved Similar appearance to prior ECG Confirmed by Gareth Morgan [phone]) on 11/13/2017 11:57:06 AM Also confirmed by Gareth Morgan [phone]), editor Laurena Spies [phone])  on 11/13/2017 1:09:51 PM       Radiology Dg Chest 2 View  Result Date: 11/13/2017 CLINICAL DATA:  Shortness of Breath.  Lung cancer. EXAM: CHEST  2 VIEW COMPARISON:  CT 09/03/2017.  Chest x-ray 09/01/2017 FINDINGS: Large left upper lobe mass noted. Elevation of the left hemidiaphragm with small left effusion. No focal opacity on the right. Cardiomegaly. Fullness in the right paratracheal region is related to a right aortic arch as seen on prior CT IMPRESSION: Large left upper lobe mass again noted. Elevated left hemidiaphragm with small left effusion. Cardiomegaly.  Right aortic arch. Electronically Signed   By: Rolm Baptise M.D.   On: 11/13/2017 13:08   Ct Angio Chest Pe W And/or Wo Contrast  Result Date:  11/13/2017 CLINICAL DATA:  Fatigue and shortness of breath. History of non-small-cell lung cancer EXAM: CT ANGIOGRAPHY CHEST WITH CONTRAST TECHNIQUE: Multidetector CT imaging  of the chest was performed using the standard protocol during bolus administration of intravenous contrast. Multiplanar CT image reconstructions and MIPs were obtained to evaluate the vascular anatomy. CONTRAST:  178mL ISOVUE-370 IOPAMIDOL (ISOVUE-370) INJECTION 76% COMPARISON:  09/03/2017 FINDINGS: Cardiovascular: Heart is enlarged. 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 There is no filling defect within the opacified pulmonary arteries to suggest the presence of an acute pulmonary embolus. The left main pulmonary artery is dramatically attenuated by the mass in the left upper lung and the pulmonary artery to the left upper lobe is occluded. Mediastinum/Nodes: Stable 11 mm short axis subcarinal lymph node. No right hilar lymphadenopathy. The esophagus has normal imaging features. There is no axillary lymphadenopathy. Lungs/Pleura: Anterior left upper lung and mediastinal mass has decreased in the interval measuring 9 x 6.2 cm today compared to 10 x 7.3 cm previously. Small left pleural effusion on today's study has decreased. Left upper lobe collapse again noted. There is dramatic mass-effect on the left lower lobe bronchus and left upper lobe bronchus is obliterated. Upper Abdomen: Unremarkable. Musculoskeletal: Bone windows reveal no worrisome lytic or sclerotic osseous lesions. Review of the MIP images confirms the above findings. IMPRESSION: 1. No CT evidence for acute pulmonary embolus. 2. Interval decrease in large left upper lung mass invading the mediastinum. The mass lesion markedly attenuates the left main pulmonary artery and likely obliterates the left upper lobe pulmonary arterial branch. Lesion also generates substantial mass-effect on the airway to the left lung with  obliteration of the left upper lobe bronchus and marked attenuation of the airway to the left lower lobe. 3. 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 4. Interval decrease in left pleural effusion. Electronically Signed   By: Misty Stanley M.D.   On: 11/13/2017 13:12    Procedures Procedures (including critical care time)  Medications Ordered in ED Medications  iopamidol (ISOVUE-370) 76 % injection 100 mL (100 mLs Intravenous Contrast Given 11/13/17 1221)     Initial Impression / Assessment and Plan / ED Course  I have reviewed the triage vital signs and the nursing notes.  Pertinent labs & imaging results that were available during my care of the patient were reviewed by me and considered in my medical decision making (see chart for details).    61 year old male with history of non-small cell lung cancer, CVA, presents with concern for shortness of breath.  CT PE study done showing no evidence of pulmonary embolus, congestive heart failure pneumonia, but does show tumor burden with now likely obliteration of the left upper lobe pulmonary arterial branch, as well as significant mass-effect of the airway of the left lung with obliteration of the left upper lobe bronchus.  This is changed from prior CT.  Feel this is most likely etiology of his increased shortness of breath.  Do not see signs of other acute pathology.  Discussed with pulmonologist on-call, as well as the oncologist on-call, and feel there is no acute intervention that would be appropriate given the status.  Discussed with patient.  He feels improved on 4 L per nasal cannula, was able to ambulate in the emergency department without desaturation or significant dyspnea, and feel comfortable with discharge now on higher oxygen.  Prior to discharge, patient reports he developed a headache today.  Slow onset of headache, doubt subarachnoid hemorrhage.  Discussed with oncology and patient, and feel it  is appropriate to have him follow-up with  his oncologist as an outpatient, for possible outpatient given cancer history and headache unless he develops other new or concerning symptoms.  Patient discharged in stable condition with understanding of reasons to return.   Final Clinical Impressions(s) / ED Diagnoses   Final diagnoses:  Malignant neoplasm of left lung, unspecified part of lung ([place])  Acute on chronic respiratory failure with hypoxia ([place]), increased O2 requirement secondary to extension of lung cancer    ED Discharge Orders    None       Gareth Morgan, MD 11/13/17 1943

## 2017-11-15 ENCOUNTER — Ambulatory Visit (HOSPITAL_COMMUNITY): Payer: Medicaid Other

## 2017-11-15 ENCOUNTER — Telehealth: Payer: Self-pay | Admitting: Internal Medicine

## 2017-11-15 NOTE — Telephone Encounter (Signed)
Scheduled appt per 11/30 sch msg - left voicemail for patient regarding appt.

## 2017-11-16 ENCOUNTER — Ambulatory Visit: Payer: Medicaid Other

## 2017-11-16 ENCOUNTER — Other Ambulatory Visit: Payer: Medicaid Other

## 2017-11-17 ENCOUNTER — Other Ambulatory Visit: Payer: Self-pay | Admitting: Urology

## 2017-11-17 ENCOUNTER — Encounter (HOSPITAL_COMMUNITY): Payer: Self-pay | Admitting: Radiology

## 2017-11-17 ENCOUNTER — Ambulatory Visit (HOSPITAL_COMMUNITY)
Admission: RE | Admit: 2017-11-17 | Discharge: 2017-11-17 | Disposition: A | Discharge status: Home / Self Care | Payer: Medicaid Other | Source: Ambulatory Visit | Attending: Urology | Admitting: Urology

## 2017-11-17 ENCOUNTER — Ambulatory Visit (HOSPITAL_COMMUNITY): Payer: Medicaid Other

## 2017-11-17 DIAGNOSIS — C3490 Malignant neoplasm of unspecified part of unspecified bronchus or lung: Secondary | ICD-10-CM | POA: Diagnosis not present

## 2017-11-17 DIAGNOSIS — C3492 Malignant neoplasm of unspecified part of left bronchus or lung: Secondary | ICD-10-CM

## 2017-11-17 DIAGNOSIS — J90 Pleural effusion, not elsewhere classified: Secondary | ICD-10-CM | POA: Insufficient documentation

## 2017-11-17 DIAGNOSIS — I700 Atherosclerosis of aorta: Secondary | ICD-10-CM | POA: Insufficient documentation

## 2017-11-17 MED ORDER — IOPAMIDOL (ISOVUE-300) INJECTION 61%
INTRAVENOUS | Status: AC
  Administered 2017-11-17: 100 mL via INTRAVENOUS
  Filled 2017-11-17: qty 100

## 2017-11-18 ENCOUNTER — Encounter: Payer: Self-pay | Admitting: Urology

## 2017-11-18 ENCOUNTER — Inpatient Hospital Stay
Admission: RE | Admit: 2017-11-18 | Discharge: 2017-11-18 | Disposition: A | Discharge status: Home / Self Care | Payer: Self-pay | Source: Ambulatory Visit | Attending: Urology | Admitting: Urology

## 2017-11-18 ENCOUNTER — Ambulatory Visit (HOSPITAL_BASED_OUTPATIENT_CLINIC_OR_DEPARTMENT_OTHER): Payer: Medicaid Other | Admitting: Oncology

## 2017-11-18 ENCOUNTER — Other Ambulatory Visit (HOSPITAL_BASED_OUTPATIENT_CLINIC_OR_DEPARTMENT_OTHER): Payer: Medicaid Other

## 2017-11-18 ENCOUNTER — Ambulatory Visit: Payer: Medicaid Other | Admitting: Urology

## 2017-11-18 ENCOUNTER — Encounter: Payer: Self-pay | Admitting: Oncology

## 2017-11-18 ENCOUNTER — Ambulatory Visit
Admission: RE | Admit: 2017-11-18 | Discharge: 2017-11-18 | Disposition: A | Discharge status: Home / Self Care | Payer: Medicaid Other | Source: Ambulatory Visit | Attending: Urology | Admitting: Urology

## 2017-11-18 ENCOUNTER — Other Ambulatory Visit: Payer: Self-pay

## 2017-11-18 VITALS — BP 109/73 | HR 116 | Temp 97.9°F | Resp 19 | Ht 65.0 in

## 2017-11-18 VITALS — BP 109/73 | HR 116 | Temp 97.9°F | Resp 19 | Ht 65.0 in | Wt 152.8 lb

## 2017-11-18 DIAGNOSIS — C3412 Malignant neoplasm of upper lobe, left bronchus or lung: Secondary | ICD-10-CM

## 2017-11-18 DIAGNOSIS — C3490 Malignant neoplasm of unspecified part of unspecified bronchus or lung: Secondary | ICD-10-CM | POA: Diagnosis not present

## 2017-11-18 DIAGNOSIS — J90 Pleural effusion, not elsewhere classified: Secondary | ICD-10-CM | POA: Diagnosis not present

## 2017-11-18 DIAGNOSIS — C7951 Secondary malignant neoplasm of bone: Secondary | ICD-10-CM | POA: Diagnosis not present

## 2017-11-18 DIAGNOSIS — C3492 Malignant neoplasm of unspecified part of left bronchus or lung: Secondary | ICD-10-CM

## 2017-11-18 LAB — CBC WITH DIFFERENTIAL/PLATELET
BASO%: 1.1 % (ref 0.0–2.0)
BASOS ABS: 0.1 10*3/uL (ref 0.0–0.1)
EOS ABS: 0.4 10*3/uL (ref 0.0–0.5)
EOS%: 4.4 % (ref 0.0–7.0)
HEMATOCRIT: 31 % — AB (ref 38.4–49.9)
HEMOGLOBIN: 9.5 g/dL — AB (ref 13.0–17.1)
LYMPH#: 0.2 10*3/uL — AB (ref 0.9–3.3)
LYMPH%: 2.3 % — ABNORMAL LOW (ref 14.0–49.0)
MCH: 24.9 pg — AB (ref 27.2–33.4)
MCHC: 30.6 g/dL — ABNORMAL LOW (ref 32.0–36.0)
MCV: 81.5 fL (ref 79.3–98.0)
MONO#: 0.9 10*3/uL (ref 0.1–0.9)
MONO%: 9 % (ref 0.0–14.0)
NEUT%: 83.2 % — ABNORMAL HIGH (ref 39.0–75.0)
NEUTROS ABS: 8.5 10*3/uL — AB (ref 1.5–6.5)
PLATELETS: 382 10*3/uL (ref 140–400)
RBC: 3.8 10*6/uL — ABNORMAL LOW (ref 4.20–5.82)
RDW: 18.9 % — AB (ref 11.0–14.6)
WBC: 10.2 10*3/uL (ref 4.0–10.3)

## 2017-11-18 LAB — COMPREHENSIVE METABOLIC PANEL
ALT: 15 U/L (ref 0–55)
ANION GAP: 14 meq/L — AB (ref 3–11)
AST: 11 U/L (ref 5–34)
Albumin: 2.8 g/dL — ABNORMAL LOW (ref 3.5–5.0)
Alkaline Phosphatase: 79 U/L (ref 40–150)
BUN: 6.7 mg/dL — ABNORMAL LOW (ref 7.0–26.0)
CHLORIDE: 86 meq/L — AB (ref 98–109)
CO2: 36 meq/L — AB (ref 22–29)
CREATININE: 0.6 mg/dL — AB (ref 0.7–1.3)
Calcium: 10.4 mg/dL (ref 8.4–10.4)
EGFR: >60 mL/min/{1.73_m2} (ref >60)
Glucose: 142 mg/dl — ABNORMAL HIGH (ref 70–140)
POTASSIUM: 4 meq/L (ref 3.5–5.1)
Sodium: 136 mEq/L (ref 136–145)
Total Bilirubin: 0.31 mg/dL (ref 0.20–1.20)
Total Protein: 8 g/dL (ref 6.4–8.3)

## 2017-11-18 NOTE — Progress Notes (Signed)
[facility] OFFICE PROGRESS NOTE  [name], FNP [place] [address]  DIAGNOSIS: stage IV (T4, N3, M1c) non-small cell lung cancer, squamous cell carcinoma presented with left sided obstructing mediastinal mass with complete occlusion of the left upper lobe bronchus and postobstructive atelectasis of the left upper lobe as well as contralateral right paratracheal lymphadenopathy and hypermetabolic metastasis in the sternum as well as left pleural effusion diagnosed in July 2018  PRIOR THERAPY: status post a course of palliative radiotherapy to the large left mediastinal mass and the sternum under the care of Dr. Tammi Klippel.  CURRENT THERAPY: systemic chemotherapy was carboplatin for AUC of 5, paclitaxel 175 MG/M2 and Ketruda (pembrolizumab) 200 MG IV every 3 weeks. First dose was expected on 10/05/2017.  The patient canceled his appointments and wants to think about chemotherapy.  INTERVAL HISTORY: Levi Brown 61 y.o. male returns for routine follow-up visit accompanied by his sister.  The patient reports that he was seen in the emergency room over the weekend due to increasing shortness of breath.  His oxygen was increased in the emergency room and he underwent a CT angiogram which was negative for PE and infection.  He reports that his vision was not working at home which is why he thinks he had difficulty with shortness of breath.  Patient was also having some increased headaches and floaters in his eyes during that time but these have improved since he is now receiving his oxygen correctly.  The patient denies fevers and chills.  Denies chest pain, cough, hemoptysis.  He has his baseline shortness of breath.  Denies nausea, vomiting, constipation, diarrhea.  He denies weight loss or night sweats.  The patient is here for repeat lab work and discuss treatment options.  MEDICAL HISTORY: Past Medical History:  Diagnosis Date  . Former smoker    Quit smoking  in early 2017  . Non-small cell cancer of left lung ([place]) 07/13/2017  . Stroke ([place]) 07/07/2017   [place]    ALLERGIES:  has No Known Allergies.  MEDICATIONS:  Current Outpatient Medications  Medication Sig Dispense Refill  . albuterol (PROVENTIL HFA;VENTOLIN HFA) 108 (90 Base) MCG/ACT inhaler Inhale 2 puffs into the lungs every 4 (four) hours as needed for wheezing or shortness of breath (cough, shortness of breath or wheezing.). 54 g 3  . aspirin EC 81 MG tablet Take 1 tablet (81 mg total) by mouth daily. 30 tablet 0  . azithromycin (ZITHROMAX) 250 MG tablet Take 2 tabs PO x 1 dose, then 1 tab PO QD x 4 days (Patient not taking: Reported on 11/18/2017) 6 tablet 0  . Dextromethorphan Polistirex (COUGH DM PO) Take 30 mLs by mouth daily as needed (cough).    . feeding supplement (BOOST / RESOURCE BREEZE) LIQD Take 1 Container by mouth 3 (three) times daily between meals. 30 Container 0  . ferrous sulfate (FERROUSUL) 325 (65 FE) MG tablet Take 1 tablet (325 mg total) by mouth 2 (two) times daily with a meal. 60 tablet 3  . fluticasone furoate-vilanterol (BREO ELLIPTA) 100-25 MCG/INH AEPB Inhale 1 puff into the lungs daily. (Patient not taking: Reported on 11/13/2017) 1 each 0  . furosemide (LASIX) 20 MG tablet Take 1 tablet (20 mg total) by mouth daily as needed. (Patient not taking: Reported on 11/13/2017) 30 tablet 1  . ipratropium-albuterol (DUONEB) 0.5-2.5 (3) MG/3ML SOLN Take 3 mLs by nebulization every 4 (four) hours as needed. 360 mL 0  . metoprolol  tartrate (LOPRESSOR) 25 MG tablet Take 1 tablet (25 mg total) by mouth daily. 30 tablet 3  . Multiple Vitamins-Minerals (DRY EYE FORMULA PO) Place 1 drop into both eyes daily.    . ondansetron (ZOFRAN ODT) 4 MG disintegrating tablet Take 1 tablet (4 mg total) by mouth every 8 (eight) hours as needed for nausea or vomiting. (Patient not taking: Reported on 11/13/2017) 30 tablet 0  . pantoprazole (PROTONIX) 40 MG tablet Take 1 tablet (40 mg total) by  mouth at bedtime. (Patient not taking: Reported on 11/13/2017) 30 tablet 3  . potassium chloride SA (K-DUR,KLOR-CON) 20 MEQ tablet Take 1 tablet (20 mEq total) by mouth daily. 5 tablet 0  . predniSONE (DELTASONE) 20 MG tablet Take 20 mg by mouth daily with breakfast.  1  . prochlorperazine (COMPAZINE) 10 MG tablet Take 1 tablet (10 mg total) by mouth every 6 (six) hours as needed for nausea or vomiting. (Patient not taking: Reported on 11/13/2017) 30 tablet 0  . promethazine-codeine (PHENERGAN WITH CODEINE) 6.25-10 MG/5ML syrup Take 5 mLs by mouth every 6 (six) hours as needed for cough. (Patient not taking: Reported on 11/13/2017) 120 mL 0  . senna-docusate (SENOKOT-S) 8.6-50 MG tablet Take 1-2 tablets by mouth at bedtime. 60 tablet 0  . sucralfate (CARAFATE) 1 GM/10ML suspension Take 10 mLs (1 g total) by mouth 3 (three) times daily with meals. (Patient not taking: Reported on 11/13/2017) 420 mL 0  . traZODone (DESYREL) 50 MG tablet Take 0.5-1 tablets (25-50 mg total) by mouth at bedtime as needed for sleep. (Patient not taking: Reported on 11/13/2017) 60 tablet 3   No current facility-administered medications for this visit.     SURGICAL HISTORY:  Past Surgical History:  Procedure Laterality Date  . VIDEO BRONCHOSCOPY Bilateral 07/07/2017   Procedure: VIDEO BRONCHOSCOPY WITHOUT FLUORO;  Surgeon: Collene Gobble, MD;  Location: [facility] ENDOSCOPY;  Service: Cardiopulmonary;  Laterality: Bilateral;    REVIEW OF SYSTEMS:   Review of Systems  Constitutional: Negative for appetite change, chills, fatigue, fever and unexpected weight change.  HENT:   Negative for mouth sores, nosebleeds, sore throat and trouble swallowing.   Eyes: Negative for eye problems and icterus.  Respiratory: Negative for cough, hemoptysis,  and wheezing.  Positive for baseline shortness of breath. Cardiovascular: Negative for chest pain and leg swelling.  Gastrointestinal: Negative for abdominal pain, constipation, diarrhea,  nausea and vomiting.  Genitourinary: Negative for bladder incontinence, difficulty urinating, dysuria, frequency and hematuria.   Musculoskeletal: Negative for back pain, gait problem, neck pain and neck stiffness.  Skin: Negative for itching and rash.  Neurological: Negative for dizziness, extremity weakness, gait problem, headaches, light-headedness and seizures.  Hematological: Negative for adenopathy. Does not bruise/bleed easily.  Psychiatric/Behavioral: Negative for confusion, depression and sleep disturbance. The patient is not nervous/anxious.     PHYSICAL EXAMINATION:  Blood pressure 109/73, pulse (!) 116, temperature 97.9 F (36.6 C), temperature source Oral, resp. rate 19, height 5\' 5"  (1.651 m), weight 152 lb 12.8 oz (69.3 kg), SpO2 100 %.  ECOG PERFORMANCE STATUS: 1 - Symptomatic but completely ambulatory  Physical Exam  Constitutional: Oriented to person, place, and time and well-developed, well-nourished, and in no distress. No distress.  HENT:  Head: Normocephalic and atraumatic.  Mouth/Throat: Oropharynx is clear and moist. No oropharyngeal exudate.  Eyes: Conjunctivae are normal. Right eye exhibits no discharge. Left eye exhibits no discharge. No scleral icterus.  Neck: Normal range of motion. Neck supple.  Cardiovascular: Normal rate, regular rhythm, normal  heart sounds and intact distal pulses.   Pulmonary/Chest: Effort normal and breath sounds normal. No respiratory distress. No wheezes. No rales.  Abdominal: Soft. Bowel sounds are normal. Exhibits no distension and no mass. There is no tenderness.  Musculoskeletal: Normal range of motion. Exhibits no edema.  Lymphadenopathy:    No cervical adenopathy.  Neurological: Alert and oriented to person, place, and time. Exhibits normal muscle tone. Gait normal. Coordination normal.  Skin: Skin is warm and dry. No rash noted. Not diaphoretic. No erythema. No pallor.  Psychiatric: Mood, memory and judgment normal.  Vitals  reviewed.  LABORATORY DATA: Lab Results  Component Value Date   WBC 10.2 11/18/2017   HGB 9.5 (L) 11/18/2017   HCT 31.0 (L) 11/18/2017   MCV 81.5 11/18/2017   PLT 382 11/18/2017      Chemistry      Component Value Date/Time   NA 136 11/18/2017 0921   K 4.0 11/18/2017 0921   CL 85 (L) 11/13/2017 1156   CO2 36 (H) 11/18/2017 0921   BUN 6.7 (L) 11/18/2017 0921   CREATININE 0.6 (L) 11/18/2017 0921      Component Value Date/Time   CALCIUM 10.4 11/18/2017 0921   ALKPHOS 79 11/18/2017 0921   AST 11 11/18/2017 0921   ALT 15 11/18/2017 0921   BILITOT 0.31 11/18/2017 0921       RADIOGRAPHIC STUDIES:  Dg Chest 2 View  Result Date: 11/13/2017 CLINICAL DATA:  Shortness of Breath.  Lung cancer. EXAM: CHEST  2 VIEW COMPARISON:  CT 09/03/2017.  Chest x-ray 09/01/2017 FINDINGS: Large left upper lobe mass noted. Elevation of the left hemidiaphragm with small left effusion. No focal opacity on the right. Cardiomegaly. Fullness in the right paratracheal region is related to a right aortic arch as seen on prior CT IMPRESSION: Large left upper lobe mass again noted. Elevated left hemidiaphragm with small left effusion. Cardiomegaly.  Right aortic arch. Electronically Signed   By: Rolm Baptise M.D.   On: 11/13/2017 13:08   Ct Angio Chest Pe W And/or Wo Contrast  Result Date: 11/13/2017 CLINICAL DATA:  Fatigue and shortness of breath. History of non-small-cell lung cancer EXAM: CT ANGIOGRAPHY CHEST WITH CONTRAST TECHNIQUE: Multidetector CT imaging of the chest was performed using the standard protocol during bolus administration of intravenous contrast. Multiplanar CT image reconstructions and MIPs were obtained to evaluate the vascular anatomy. CONTRAST:  160mL ISOVUE-370 IOPAMIDOL (ISOVUE-370) INJECTION 76% COMPARISON:  09/03/2017 FINDINGS: Cardiovascular: Heart is enlarged. 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 There is no filling  defect within the opacified pulmonary arteries to suggest the presence of an acute pulmonary embolus. The left main pulmonary artery is dramatically attenuated by the mass in the left upper lung and the pulmonary artery to the left upper lobe is occluded. Mediastinum/Nodes: Stable 11 mm short axis subcarinal lymph node. No right hilar lymphadenopathy. The esophagus has normal imaging features. There is no axillary lymphadenopathy. Lungs/Pleura: Anterior left upper lung and mediastinal mass has decreased in the interval measuring 9 x 6.2 cm today compared to 10 x 7.3 cm previously. Small left pleural effusion on today's study has decreased. Left upper lobe collapse again noted. There is dramatic mass-effect on the left lower lobe bronchus and left upper lobe bronchus is obliterated. Upper Abdomen: Unremarkable. Musculoskeletal: Bone windows reveal no worrisome lytic or sclerotic osseous lesions. Review of the MIP images confirms the above findings. IMPRESSION: 1. No CT evidence for acute pulmonary embolus. 2.  Interval decrease in large left upper lung mass invading the mediastinum. The mass lesion markedly attenuates the left main pulmonary artery and likely obliterates the left upper lobe pulmonary arterial branch. Lesion also generates substantial mass-effect on the airway to the left lung with obliteration of the left upper lobe bronchus and marked attenuation of the airway to the left lower lobe. 3. 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 4. Interval decrease in left pleural effusion. Electronically Signed   By: Misty Stanley M.D.   On: 11/13/2017 13:12   Ct Abdomen W Contrast  Result Date: 11/17/2017 CLINICAL DATA:  Non-small cell lung cancer. EXAM: CT ABDOMEN WITH CONTRAST TECHNIQUE: Multidetector CT imaging of the abdomen was performed using the standard protocol following bolus administration of intravenous contrast. CONTRAST:  165mL ISOVUE-300 IOPAMIDOL  (ISOVUE-300) INJECTION 61% COMPARISON:  CT chest 11/13/2017 and PET 07/29/2017. CT abdomen pelvis 07/06/2017. FINDINGS: Lower chest: Small left pleural effusion, partially imaged. Left hemidiaphragm is elevated. Heart is at the upper limits of normal in size. Small amount of pericardial fluid may be physiologic. Hepatobiliary: Liver measures at the upper limits of normal, 18.0 cm. Liver and gallbladder are otherwise unremarkable. No biliary ductal dilatation. Pancreas: Negative. Spleen: Negative. Adrenals/Urinary Tract: Right adrenal gland is unremarkable. Thickening of the medial limb left adrenal gland. Kidneys are unremarkable. Stomach/Bowel: Stomach is decompressed. Visualized portions of the small bowel and colon are unremarkable. Vascular/Lymphatic: Atherosclerotic calcification of the arterial vasculature without abdominal aortic aneurysm. No pathologically enlarged lymph nodes. Other: No free fluid.  Mesenteries and peritoneum are unremarkable. Musculoskeletal: No worrisome lytic or sclerotic lesions. IMPRESSION: 1. Small pleural effusion, partially imaged.  Up 2.  Aortic atherosclerosis (ICD10-170.0). Electronically Signed   By: Lorin Picket M.D.   On: 11/17/2017 15:33     ASSESSMENT/PLAN:  Non-small cell cancer of left lung [facility]) this is a very pleasant 61 year old African-American male with stage IV (T4, N3, M1c) non-small cell lung cancer, squamous cell carcinoma presented with left sided obstructing mediastinal mass with complete occlusion of the left upper lobe bronchus and postobstructive atelectasis of the left upper lobe as well as contralateral right paratracheal lymphadenopathy and hypermetabolic metastasis in the sternum as well as left pleural effusion diagnosed in July 2018 He underwent a course of palliative radiotherapy to the obstructive left mediastinal mass as well as the sternum. It was recommended for the patient to undergo systemic chemotherapy with carboplatin for an AUC of  5, paclitaxel 175 mg/m, and Keytruda 200 mg IV every 3 weeks.  The patient was scheduled to start, but he canceled his appointments because he did not want to proceed with chemotherapy.  The patient is now feeling stronger and is willing to consider treatment, but wanted to review his CT scans before making a final decision.  Patient was seen with Dr. Julien Nordmann.    CT scan results were discussed with the patient and the images were shown to the patient and his sister.  Explained to the patient that he has some improvement in the disease in the lung, but there is still a lot of disease that needs to be treated with systemic treatment.  We also discussed that he has a recurrent small pleural effusion that is too small for thoracentesis at this time.  We again had a lengthy discussion with the patient and his sister again today about his current disease stage, prognosis, and treatment options.  The patient understands that he has an incurable condition  and treatment would be palliative in nature.  We discussed recommendations again including systemic chemotherapy with carboplatin for an AUC of 5, paclitaxel 175 mg meter squared, and Keytruda 200 mg IV every 3 weeks.  We also discussed that the patient could consider palliative care/hospice.    At the patient remains undecided regarding proceeding with treatment.  He prefers to talk with Dr. Tammi Klippel before making a final decision.  The patient does not currently have an appoint with Dr. Tammi Klippel but is going to schedule one today.  The patient will call us once he makes a final decision and we will schedule future appointments based on this.  The patient should continue over-the-counter oral iron tablets 2 tablets by mouth daily for his persistent iron deficiency anemia.  He was advised to call immediately if he has any concerning symptoms in the interval. The patient voices understanding of current disease status and treatment options and is in agreement  with the current care plan.  All questions were answered. The patient knows to call the clinic with any problems, questions or concerns. We can certainly see the patient much sooner if necessary.  No orders of the defined types were placed in this encounter.   Mikey Bussing, DNP, AGPCNP-BC, AOCNP 11/18/17   ADDENDUM: Hematology/Oncology Attending: I had a face-to-face encounter with the patient.  I recommended his care plan.  This is a very pleasant 61 years old African-American male with a stage IV non-small cell lung cancer, squamous cell carcinoma with left-sided obstructing mediastinal mass status post palliative radiotherapy. The patient was supposed to start palliative systemic chemotherapy but he has been delaying this option. He had recent CT scan of the chest, abdomen and pelvis performed recently.  I personally and independently reviewed the scan images and discussed the results and showed the images to the patient and his sister. He has a scan showed some improvement in the obstructive left mediastinal mass but the patient continues to have significant lesion and tumor in the left lung. I discussed with the patient and his sister again the role of palliative systemic chemotherapy versus palliative care. He still unable to make a decision and would like to wait until he has a discussion with Dr. Tammi Klippel. I advised the patient to call once he reaches a decision regarding proceeding with palliative systemic therapy versus referral to palliative care. The patient was advised to call immediately if he has any concerning symptoms in the interval.  Disclaimer: This note was dictated with voice recognition software. Similar sounding words can inadvertently be transcribed and may be missed upon review. Eilleen Kempf, MD 11/20/17

## 2017-11-18 NOTE — Addendum Note (Signed)
Encounter addended by: Malena Edman, RN on: 11/18/2017 1:56 PM  Actions taken: Charge Capture section accepted

## 2017-11-18 NOTE — Assessment & Plan Note (Signed)
this is a very pleasant 61 year old African-American male with stage IV (T4, N3, M1c) non-small cell lung cancer, squamous cell carcinoma presented with left sided obstructing mediastinal mass with complete occlusion of the left upper lobe bronchus and postobstructive atelectasis of the left upper lobe as well as contralateral right paratracheal lymphadenopathy and hypermetabolic metastasis in the sternum as well as left pleural effusion diagnosed in July 2018 He underwent a course of palliative radiotherapy to the obstructive left mediastinal mass as well as the sternum. It was recommended for the patient to undergo systemic chemotherapy with carboplatin for an AUC of 5, paclitaxel 175 mg/m, and Keytruda 200 mg IV every 3 weeks.  The patient was scheduled to start, but he canceled his appointments because he did not want to proceed with chemotherapy.  The patient is now feeling stronger and is willing to consider treatment, but wanted to review his CT scans before making a final decision.  Patient was seen with Dr. Julien Nordmann.    CT scan results were discussed with the patient and the images were shown to the patient and his sister.  Explained to the patient that he has some improvement in the disease in the lung, but there is still a lot of disease that needs to be treated with systemic treatment.  We also discussed that he has a recurrent small pleural effusion that is too small for thoracentesis at this time.  We again had a lengthy discussion with the patient and his sister again today about his current disease stage, prognosis, and treatment options.  The patient understands that he has an incurable condition and treatment would be palliative in nature.  We discussed recommendations again including systemic chemotherapy with carboplatin for an AUC of 5, paclitaxel 175 mg meter squared, and Keytruda 200 mg IV every 3 weeks.  We also discussed that the patient could consider palliative care/hospice.    At  the patient remains undecided regarding proceeding with treatment.  He prefers to talk with Dr. Tammi Klippel before making a final decision.  The patient does not currently have an appoint with Dr. Tammi Klippel but is going to schedule one today.  The patient will call us once he makes a final decision and we will schedule future appointments based on this.  The patient should continue over-the-counter oral iron tablets 2 tablets by mouth daily for his persistent iron deficiency anemia.  He was advised to call immediately if he has any concerning symptoms in the interval. The patient voices understanding of current disease status and treatment options and is in agreement with the current care plan.  All questions were answered. The patient knows to call the clinic with any problems, questions or concerns. We can certainly see the patient much sooner if necessary.

## 2017-11-18 NOTE — Progress Notes (Signed)
Radiation Oncology         [phone] ________________________________  Name: JERAMEY LANUZA MRN: [id number]  Date: 11/18/2017  DOB: February 09, 1956  Post Treatment Note  CC: [name], FNP  Ardath Sax, MD  Diagnosis:   61 yo man with Stage IV (T4, N3, M1c) non-small cell lung cancer, squamous cell carcinoma.   Interval Since Last Radiation:  2 months  Palliative Radiotherapy 07/29/2017 to 09/17/2017:   The primary tumor and involved mediastinal adenopathy were treated to 66 Gy in 33 fractions of 2 Gy.  Narrative:  The patient returns today for routine follow-up and to review results from recent post-treatment imaging for disease restaging.                            On review of systems, the patient states that he is doing well overall.  He continues gradually regaining his strength and his appetite. He has recently gained 6 pounds which he is quite pleased with. He also reports a significant improvement in his breathing with decreased shortness of breath since completing radiotherapy.  He denies productive cough, hemoptysis, fever, chills or dyspnea.  He has continued to see improvement in his energy and overall well-being and is thus now reconsidering the potential for palliative systemic treatment if deemed appropriate.  Recent imaging shows a good response to radiotherapy with a decrease in size of the large left upper lung mass invading the mediastinum. However, the mass lesion continues to markedly attenuate the left main pulmonary artery and likely obliterates the left upper lobe pulmonary arterial branch. The lesion also generates substantial mass-effect on the airway to the left lung with obliteration of the left upper lobe bronchus and marked attenuation of the airway to the left lower lobe. There was a stable 11 mm subcarinal lymph node and no right hilar lymphadenopathy or evidence of metastatic disease in the abdomen. He recently met with Mikey Bussing, NP and Dr.  Julien Nordmann this morning and was again advised to proceed with palliative systemic therapy based on findings from recent disease restaging scans. He has met with Wadie Lessen, NP and plans to notify her and/or his PCP if/when hospice services are needed but is not interested at this time.  ALLERGIES:  has No Known Allergies.  Meds: Current Outpatient Medications  Medication Sig Dispense Refill  . albuterol (PROVENTIL HFA;VENTOLIN HFA) 108 (90 Base) MCG/ACT inhaler Inhale 2 puffs into the lungs every 4 (four) hours as needed for wheezing or shortness of breath (cough, shortness of breath or wheezing.). 54 g 3  . aspirin EC 81 MG tablet Take 1 tablet (81 mg total) by mouth daily. 30 tablet 0  . Dextromethorphan Polistirex (COUGH DM PO) Take 30 mLs by mouth daily as needed (cough).    . feeding supplement (BOOST / RESOURCE BREEZE) LIQD Take 1 Container by mouth 3 (three) times daily between meals. 30 Container 0  . ferrous sulfate (FERROUSUL) 325 (65 FE) MG tablet Take 1 tablet (325 mg total) by mouth 2 (two) times daily with a meal. 60 tablet 3  . ipratropium-albuterol (DUONEB) 0.5-2.5 (3) MG/3ML SOLN Take 3 mLs by nebulization every 4 (four) hours as needed. 360 mL 0  . metoprolol tartrate (LOPRESSOR) 25 MG tablet Take 1 tablet (25 mg total) by mouth daily. 30 tablet 3  . Multiple Vitamins-Minerals (DRY EYE FORMULA PO) Place 1 drop into both eyes daily.    . potassium chloride SA (K-DUR,KLOR-CON)  20 MEQ tablet Take 1 tablet (20 mEq total) by mouth daily. 5 tablet 0  . predniSONE (DELTASONE) 20 MG tablet Take 20 mg by mouth daily with breakfast.  1  . senna-docusate (SENOKOT-S) 8.6-50 MG tablet Take 1-2 tablets by mouth at bedtime. 60 tablet 0  . azithromycin (ZITHROMAX) 250 MG tablet Take 2 tabs PO x 1 dose, then 1 tab PO QD x 4 days (Patient not taking: Reported on 11/18/2017) 6 tablet 0  . fluticasone furoate-vilanterol (BREO ELLIPTA) 100-25 MCG/INH AEPB Inhale 1 puff into the lungs daily. (Patient  not taking: Reported on 11/13/2017) 1 each 0  . furosemide (LASIX) 20 MG tablet Take 1 tablet (20 mg total) by mouth daily as needed. (Patient not taking: Reported on 11/13/2017) 30 tablet 1  . ondansetron (ZOFRAN ODT) 4 MG disintegrating tablet Take 1 tablet (4 mg total) by mouth every 8 (eight) hours as needed for nausea or vomiting. (Patient not taking: Reported on 11/13/2017) 30 tablet 0  . pantoprazole (PROTONIX) 40 MG tablet Take 1 tablet (40 mg total) by mouth at bedtime. (Patient not taking: Reported on 11/13/2017) 30 tablet 3  . prochlorperazine (COMPAZINE) 10 MG tablet Take 1 tablet (10 mg total) by mouth every 6 (six) hours as needed for nausea or vomiting. (Patient not taking: Reported on 11/13/2017) 30 tablet 0  . promethazine-codeine (PHENERGAN WITH CODEINE) 6.25-10 MG/5ML syrup Take 5 mLs by mouth every 6 (six) hours as needed for cough. (Patient not taking: Reported on 11/13/2017) 120 mL 0  . sucralfate (CARAFATE) 1 GM/10ML suspension Take 10 mLs (1 g total) by mouth 3 (three) times daily with meals. (Patient not taking: Reported on 11/13/2017) 420 mL 0  . traZODone (DESYREL) 50 MG tablet Take 0.5-1 tablets (25-50 mg total) by mouth at bedtime as needed for sleep. (Patient not taking: Reported on 11/13/2017) 60 tablet 3   No current facility-administered medications for this encounter.     Physical Findings:  height is '5\' 5"'$  (1.651 m). His oral temperature is 97.9 F (36.6 C). His blood pressure is 109/73 and his pulse is 116 (abnormal). His respiration is 19 and oxygen saturation is 100%.  Pain Assessment Pain Score: 0-No pain/10 In general this is a well appearing African-American male in no acute distress. He's alert and oriented x4 and appropriate throughout the examination. Cardiopulmonary assessment is negative for acute distress and he exhibits normal effort.   Lab Findings: Lab Results  Component Value Date   WBC 10.2 11/18/2017   HGB 9.5 (L) 11/18/2017   HCT 31.0 (L)  11/18/2017   MCV 81.5 11/18/2017   PLT 382 11/18/2017     Radiographic Findings: Dg Chest 2 View  Result Date: 11/13/2017 CLINICAL DATA:  Shortness of Breath.  Lung cancer. EXAM: CHEST  2 VIEW COMPARISON:  CT 09/03/2017.  Chest x-ray 09/01/2017 FINDINGS: Large left upper lobe mass noted. Elevation of the left hemidiaphragm with small left effusion. No focal opacity on the right. Cardiomegaly. Fullness in the right paratracheal region is related to a right aortic arch as seen on prior CT IMPRESSION: Large left upper lobe mass again noted. Elevated left hemidiaphragm with small left effusion. Cardiomegaly.  Right aortic arch. Electronically Signed   By: Rolm Baptise M.D.   On: 11/13/2017 13:08   Ct Angio Chest Pe W And/or Wo Contrast  Result Date: 11/13/2017 CLINICAL DATA:  Fatigue and shortness of breath. History of non-small-cell lung cancer EXAM: CT ANGIOGRAPHY CHEST WITH CONTRAST TECHNIQUE: Multidetector CT imaging of the  chest was performed using the standard protocol during bolus administration of intravenous contrast. Multiplanar CT image reconstructions and MIPs were obtained to evaluate the vascular anatomy. CONTRAST:  117m ISOVUE-370 IOPAMIDOL (ISOVUE-370) INJECTION 76% COMPARISON:  09/03/2017 FINDINGS: Cardiovascular: Heart is enlarged. 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 There is no filling defect within the opacified pulmonary arteries to suggest the presence of an acute pulmonary embolus. The left main pulmonary artery is dramatically attenuated by the mass in the left upper lung and the pulmonary artery to the left upper lobe is occluded. Mediastinum/Nodes: Stable 11 mm short axis subcarinal lymph node. No right hilar lymphadenopathy. The esophagus has normal imaging features. There is no axillary lymphadenopathy. Lungs/Pleura: Anterior left upper lung and mediastinal mass has decreased in the interval measuring 9 x 6.2 cm  today compared to 10 x 7.3 cm previously. Small left pleural effusion on today's study has decreased. Left upper lobe collapse again noted. There is dramatic mass-effect on the left lower lobe bronchus and left upper lobe bronchus is obliterated. Upper Abdomen: Unremarkable. Musculoskeletal: Bone windows reveal no worrisome lytic or sclerotic osseous lesions. Review of the MIP images confirms the above findings. IMPRESSION: 1. No CT evidence for acute pulmonary embolus. 2. Interval decrease in large left upper lung mass invading the mediastinum. The mass lesion markedly attenuates the left main pulmonary artery and likely obliterates the left upper lobe pulmonary arterial branch. Lesion also generates substantial mass-effect on the airway to the left lung with obliteration of the left upper lobe bronchus and marked attenuation of the airway to the left lower lobe. 3. 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 4. Interval decrease in left pleural effusion. Electronically Signed   By: EMisty StanleyM.D.   On: 11/13/2017 13:12   Ct Abdomen W Contrast  Result Date: 11/17/2017 CLINICAL DATA:  Non-small cell lung cancer. EXAM: CT ABDOMEN WITH CONTRAST TECHNIQUE: Multidetector CT imaging of the abdomen was performed using the standard protocol following bolus administration of intravenous contrast. CONTRAST:  1067mISOVUE-300 IOPAMIDOL (ISOVUE-300) INJECTION 61% COMPARISON:  CT chest 11/13/2017 and PET 07/29/2017. CT abdomen pelvis 07/06/2017. FINDINGS: Lower chest: Small left pleural effusion, partially imaged. Left hemidiaphragm is elevated. Heart is at the upper limits of normal in size. Small amount of pericardial fluid may be physiologic. Hepatobiliary: Liver measures at the upper limits of normal, 18.0 cm. Liver and gallbladder are otherwise unremarkable. No biliary ductal dilatation. Pancreas: Negative. Spleen: Negative. Adrenals/Urinary Tract: Right adrenal gland is  unremarkable. Thickening of the medial limb left adrenal gland. Kidneys are unremarkable. Stomach/Bowel: Stomach is decompressed. Visualized portions of the small bowel and colon are unremarkable. Vascular/Lymphatic: Atherosclerotic calcification of the arterial vasculature without abdominal aortic aneurysm. No pathologically enlarged lymph nodes. Other: No free fluid.  Mesenteries and peritoneum are unremarkable. Musculoskeletal: No worrisome lytic or sclerotic lesions. IMPRESSION: 1. Small pleural effusion, partially imaged.  Up 2.  Aortic atherosclerosis (ICD10-170.0). Electronically Signed   By: MeLorin Picket.D.   On: 11/17/2017 15:33    Impression/Plan: [id number] man with Stage IV (T4, N3, M1c) non-small cell lung cancer, squamous cell carcinoma.  He has recovered well from the effects of radiotherapy.  Recent imaging shows a good response to radiotherapy with a decrease in size of the large left upper lung mass invading the mediastinum. However, the mass lesion continues to markedly attenuate the left main pulmonary artery and likely obliterates the left upper  lobe pulmonary arterial branch. The lesion also generates substantial mass-effect on the airway to the left lung with obliteration of the left upper lobe bronchus and marked attenuation of the airway to the left lower lobe. There was a stable 11 mm subcarinal lymph node and no right hilar lymphadenopathy or evidence of metastatic disease in the abdomen. He recently met with Mikey Bussing, NP and Dr. Julien Nordmann this morning and was again advised to proceed with palliative systemic therapy based on findings from recent disease restaging scans.  He has not yet made his final decision regarding proceeding with palliative chemotherapy but is leaning towards this.  He is planning to contact Dr. Julien Nordmann by early next week with his final decision.  At this point, he will continue his routine follow up with Dr. Julien Nordmann for further disease surveillance and  management and we will plan to see him back on an as-needed basis.  He is in agreement with and is comfortable with this plan. He has previously met with Wadie Lessen, NP and plans to notify her and/or his PCP if/when hospice services are needed but is not interested at present.    [name], PA-C

## 2017-11-18 NOTE — Progress Notes (Addendum)
Levi Brown 61 yo man with stage cT4 cN2 cM0, pending PET, squamous cell carcinoma of the left upper lung completed radiation 09-17-17, review 11-17-17 CT abdomen pelvis w contrast and CT chest w contrast, FU>   Weight changes, if any:Had his jacket on when he weighted upstairs Wt Readings from Last 3 Encounters:  11/18/17 152 lb 12.8 oz (69.3 kg)  11/13/17 156 lb (70.8 kg)  11/10/17 156 lb 12.8 oz (71.1 kg)   Respiratory complaints, if any: SOB with exertion, O2 at 2-3 liters N/C, occasional cough thick whitish secretion sometimes with eheezing Hemoptysis, if any:  No Swallowing Problems/Pain/Difficulty swallowing:None Appetite :Good Pain:No When is next chemo scheduled? No BP 109/73 (BP Location: Right Arm, Patient Position: Sitting, Cuff Size: Normal)   Pulse (!) 116   Temp 97.9 F (36.6 C) (Oral)   Resp 19   Ht 5\' 5"  (1.651 m)   SpO2 100%   BMI 25.43 kg/m  11-18-17 Saw Dr.Mohamed he wants to start chemotherapy per patient and sister, no note in chart yet from today's visit.

## 2017-11-26 ENCOUNTER — Inpatient Hospital Stay (HOSPITAL_COMMUNITY)
Admission: EM | Admit: 2017-11-26 | Discharge: 2017-12-14 | DRG: 208 | Disposition: E | Payer: Medicaid Other | Attending: Family Medicine | Admitting: Family Medicine

## 2017-11-26 ENCOUNTER — Emergency Department (HOSPITAL_COMMUNITY): Payer: Medicaid Other

## 2017-11-26 ENCOUNTER — Encounter (HOSPITAL_COMMUNITY): Payer: Self-pay | Admitting: Emergency Medicine

## 2017-11-26 ENCOUNTER — Encounter: Payer: Self-pay | Admitting: Family Medicine

## 2017-11-26 ENCOUNTER — Ambulatory Visit (INDEPENDENT_AMBULATORY_CARE_PROVIDER_SITE_OTHER): Payer: Medicaid Other | Admitting: Family Medicine

## 2017-11-26 VITALS — BP 96/58 | HR 168 | Temp 98.0°F | Resp 12 | Ht 65.0 in | Wt 160.0 lb

## 2017-11-26 DIAGNOSIS — R6521 Severe sepsis with septic shock: Secondary | ICD-10-CM | POA: Diagnosis not present

## 2017-11-26 DIAGNOSIS — J9622 Acute and chronic respiratory failure with hypercapnia: Secondary | ICD-10-CM | POA: Diagnosis present

## 2017-11-26 DIAGNOSIS — C3490 Malignant neoplasm of unspecified part of unspecified bronchus or lung: Principal | ICD-10-CM | POA: Diagnosis present

## 2017-11-26 DIAGNOSIS — J9620 Acute and chronic respiratory failure, unspecified whether with hypoxia or hypercapnia: Secondary | ICD-10-CM | POA: Diagnosis present

## 2017-11-26 DIAGNOSIS — I4892 Unspecified atrial flutter: Secondary | ICD-10-CM | POA: Diagnosis present

## 2017-11-26 DIAGNOSIS — C7951 Secondary malignant neoplasm of bone: Secondary | ICD-10-CM | POA: Diagnosis present

## 2017-11-26 DIAGNOSIS — J90 Pleural effusion, not elsewhere classified: Secondary | ICD-10-CM | POA: Diagnosis present

## 2017-11-26 DIAGNOSIS — E876 Hypokalemia: Secondary | ICD-10-CM | POA: Diagnosis present

## 2017-11-26 DIAGNOSIS — E039 Hypothyroidism, unspecified: Secondary | ICD-10-CM | POA: Diagnosis present

## 2017-11-26 DIAGNOSIS — Z01818 Encounter for other preprocedural examination: Secondary | ICD-10-CM

## 2017-11-26 DIAGNOSIS — Z923 Personal history of irradiation: Secondary | ICD-10-CM

## 2017-11-26 DIAGNOSIS — E872 Acidosis: Secondary | ICD-10-CM | POA: Diagnosis present

## 2017-11-26 DIAGNOSIS — F1010 Alcohol abuse, uncomplicated: Secondary | ICD-10-CM | POA: Diagnosis not present

## 2017-11-26 DIAGNOSIS — D638 Anemia in other chronic diseases classified elsewhere: Secondary | ICD-10-CM | POA: Diagnosis present

## 2017-11-26 DIAGNOSIS — I10 Essential (primary) hypertension: Secondary | ICD-10-CM | POA: Diagnosis present

## 2017-11-26 DIAGNOSIS — R0602 Shortness of breath: Secondary | ICD-10-CM | POA: Diagnosis present

## 2017-11-26 DIAGNOSIS — K5900 Constipation, unspecified: Secondary | ICD-10-CM | POA: Diagnosis present

## 2017-11-26 DIAGNOSIS — J189 Pneumonia, unspecified organism: Secondary | ICD-10-CM | POA: Diagnosis present

## 2017-11-26 DIAGNOSIS — Z7951 Long term (current) use of inhaled steroids: Secondary | ICD-10-CM

## 2017-11-26 DIAGNOSIS — J9601 Acute respiratory failure with hypoxia: Secondary | ICD-10-CM | POA: Diagnosis not present

## 2017-11-26 DIAGNOSIS — E871 Hypo-osmolality and hyponatremia: Secondary | ICD-10-CM | POA: Diagnosis present

## 2017-11-26 DIAGNOSIS — J9621 Acute and chronic respiratory failure with hypoxia: Secondary | ICD-10-CM | POA: Diagnosis present

## 2017-11-26 DIAGNOSIS — Z7189 Other specified counseling: Secondary | ICD-10-CM

## 2017-11-26 DIAGNOSIS — I4891 Unspecified atrial fibrillation: Secondary | ICD-10-CM | POA: Insufficient documentation

## 2017-11-26 DIAGNOSIS — Y95 Nosocomial condition: Secondary | ICD-10-CM | POA: Diagnosis present

## 2017-11-26 DIAGNOSIS — R6881 Early satiety: Secondary | ICD-10-CM | POA: Diagnosis present

## 2017-11-26 DIAGNOSIS — Y842 Radiological procedure and radiotherapy as the cause of abnormal reaction of the patient, or of later complication, without mention of misadventure at the time of the procedure: Secondary | ICD-10-CM | POA: Diagnosis present

## 2017-11-26 DIAGNOSIS — R064 Hyperventilation: Secondary | ICD-10-CM

## 2017-11-26 DIAGNOSIS — Z515 Encounter for palliative care: Secondary | ICD-10-CM | POA: Diagnosis present

## 2017-11-26 DIAGNOSIS — I483 Typical atrial flutter: Secondary | ICD-10-CM | POA: Diagnosis not present

## 2017-11-26 DIAGNOSIS — C3412 Malignant neoplasm of upper lobe, left bronchus or lung: Secondary | ICD-10-CM | POA: Diagnosis not present

## 2017-11-26 DIAGNOSIS — Z9911 Dependence on respirator [ventilator] status: Secondary | ICD-10-CM | POA: Diagnosis not present

## 2017-11-26 DIAGNOSIS — Z6825 Body mass index (BMI) 25.0-25.9, adult: Secondary | ICD-10-CM

## 2017-11-26 DIAGNOSIS — F411 Generalized anxiety disorder: Secondary | ICD-10-CM | POA: Diagnosis present

## 2017-11-26 DIAGNOSIS — Z79899 Other long term (current) drug therapy: Secondary | ICD-10-CM

## 2017-11-26 DIAGNOSIS — E43 Unspecified severe protein-calorie malnutrition: Secondary | ICD-10-CM | POA: Diagnosis present

## 2017-11-26 DIAGNOSIS — Z87891 Personal history of nicotine dependence: Secondary | ICD-10-CM | POA: Diagnosis not present

## 2017-11-26 DIAGNOSIS — J449 Chronic obstructive pulmonary disease, unspecified: Secondary | ICD-10-CM | POA: Diagnosis not present

## 2017-11-26 DIAGNOSIS — Z7982 Long term (current) use of aspirin: Secondary | ICD-10-CM

## 2017-11-26 DIAGNOSIS — G4700 Insomnia, unspecified: Secondary | ICD-10-CM | POA: Diagnosis present

## 2017-11-26 DIAGNOSIS — E878 Other disorders of electrolyte and fluid balance, not elsewhere classified: Secondary | ICD-10-CM | POA: Diagnosis present

## 2017-11-26 DIAGNOSIS — J440 Chronic obstructive pulmonary disease with acute lower respiratory infection: Secondary | ICD-10-CM | POA: Diagnosis present

## 2017-11-26 DIAGNOSIS — F419 Anxiety disorder, unspecified: Secondary | ICD-10-CM | POA: Diagnosis present

## 2017-11-26 DIAGNOSIS — Z66 Do not resuscitate: Secondary | ICD-10-CM | POA: Diagnosis present

## 2017-11-26 DIAGNOSIS — J9602 Acute respiratory failure with hypercapnia: Secondary | ICD-10-CM | POA: Diagnosis present

## 2017-11-26 DIAGNOSIS — I9589 Other hypotension: Secondary | ICD-10-CM

## 2017-11-26 DIAGNOSIS — Z8673 Personal history of transient ischemic attack (TIA), and cerebral infarction without residual deficits: Secondary | ICD-10-CM | POA: Diagnosis not present

## 2017-11-26 DIAGNOSIS — I959 Hypotension, unspecified: Secondary | ICD-10-CM | POA: Insufficient documentation

## 2017-11-26 DIAGNOSIS — A419 Sepsis, unspecified organism: Secondary | ICD-10-CM | POA: Diagnosis not present

## 2017-11-26 DIAGNOSIS — Z9981 Dependence on supplemental oxygen: Secondary | ICD-10-CM

## 2017-11-26 LAB — I-STAT CHEM 8, ED
BUN: 6 mg/dL (ref 6–20)
CHLORIDE: 80 mmol/L — AB (ref 101–111)
Calcium, Ion: 1.12 mmol/L — ABNORMAL LOW (ref 1.15–1.40)
Creatinine, Ser: 0.5 mg/dL — ABNORMAL LOW (ref 0.61–1.24)
Glucose, Bld: 108 mg/dL — ABNORMAL HIGH (ref 65–99)
HEMATOCRIT: 33 % — AB (ref 39.0–52.0)
HEMOGLOBIN: 11.2 g/dL — AB (ref 13.0–17.0)
POTASSIUM: 3.8 mmol/L (ref 3.5–5.1)
Sodium: 133 mmol/L — ABNORMAL LOW (ref 135–145)
TCO2: 44 mmol/L — ABNORMAL HIGH (ref 22–32)

## 2017-11-26 LAB — CBC WITH DIFFERENTIAL/PLATELET
Basophils Absolute: 0 10*3/uL (ref 0.0–0.1)
Basophils Relative: 0 %
EOS ABS: 0.7 10*3/uL (ref 0.0–0.7)
Eosinophils Relative: 6 %
HEMATOCRIT: 30.1 % — AB (ref 39.0–52.0)
HEMOGLOBIN: 9.1 g/dL — AB (ref 13.0–17.0)
LYMPHS ABS: 0.5 10*3/uL — AB (ref 0.7–4.0)
LYMPHS PCT: 5 %
MCH: 25 pg — AB (ref 26.0–34.0)
MCHC: 30.2 g/dL (ref 30.0–36.0)
MCV: 82.7 fL (ref 78.0–100.0)
MONOS PCT: 15 %
Monocytes Absolute: 1.7 10*3/uL — ABNORMAL HIGH (ref 0.1–1.0)
NEUTROS ABS: 8.4 10*3/uL — AB (ref 1.7–7.7)
NEUTROS PCT: 74 %
Platelets: 483 10*3/uL — ABNORMAL HIGH (ref 150–400)
RBC: 3.64 MIL/uL — AB (ref 4.22–5.81)
RDW: 17.9 % — ABNORMAL HIGH (ref 11.5–15.5)
WBC: 11.3 10*3/uL — AB (ref 4.0–10.5)

## 2017-11-26 LAB — BASIC METABOLIC PANEL
Anion gap: 8 (ref 5–15)
BUN: 5 mg/dL — AB (ref 6–20)
CHLORIDE: 83 mmol/L — AB (ref 101–111)
CO2: 42 mmol/L — AB (ref 22–32)
Calcium: 9.1 mg/dL (ref 8.9–10.3)
Creatinine, Ser: 0.43 mg/dL — ABNORMAL LOW (ref 0.61–1.24)
GFR calc non Af Amer: >60 mL/min (ref >60)
Glucose, Bld: 106 mg/dL — ABNORMAL HIGH (ref 65–99)
POTASSIUM: 3.8 mmol/L (ref 3.5–5.1)
Sodium: 133 mmol/L — ABNORMAL LOW (ref 135–145)

## 2017-11-26 LAB — ALBUMIN: Albumin: 2.6 g/dL — ABNORMAL LOW (ref 3.5–5.0)

## 2017-11-26 LAB — TROPONIN I

## 2017-11-26 LAB — I-STAT TROPONIN, ED: TROPONIN I, POC: 0 ng/mL (ref 0.00–0.08)

## 2017-11-26 LAB — TSH: TSH: 4.54 u[IU]/mL — ABNORMAL HIGH (ref 0.350–4.500)

## 2017-11-26 MED ORDER — PROCHLORPERAZINE MALEATE 10 MG PO TABS
10.0000 mg | ORAL_TABLET | Freq: Four times a day (QID) | ORAL | Status: DC | PRN
  Filled 2017-11-26: qty 1

## 2017-11-26 MED ORDER — FERROUS SULFATE 325 (65 FE) MG PO TABS
325.0000 mg | ORAL_TABLET | Freq: Two times a day (BID) | ORAL | Status: DC
  Administered 2017-11-27: 325 mg via ORAL
  Filled 2017-11-26: qty 1

## 2017-11-26 MED ORDER — IPRATROPIUM-ALBUTEROL 0.5-2.5 (3) MG/3ML IN SOLN: 3.0000 mL | RESPIRATORY_TRACT | Status: DC | PRN

## 2017-11-26 MED ORDER — ALBUTEROL SULFATE HFA 108 (90 BASE) MCG/ACT IN AERS: 2.0000 | INHALATION_SPRAY | RESPIRATORY_TRACT | Status: DC | PRN

## 2017-11-26 MED ORDER — HEPARIN (PORCINE) IN NACL 100-0.45 UNIT/ML-% IJ SOLN
1300.0000 [IU]/h | INTRAMUSCULAR | Status: DC
  Administered 2017-11-26: 1000 [IU]/h via INTRAVENOUS
  Filled 2017-11-26 (×2): qty 250

## 2017-11-26 MED ORDER — DIPHENHYDRAMINE HCL 25 MG PO CAPS
25.0000 mg | ORAL_CAPSULE | Freq: Once | ORAL | Status: AC
  Administered 2017-11-26: 25 mg via ORAL
  Filled 2017-11-26 (×2): qty 1

## 2017-11-26 MED ORDER — TRAZODONE HCL 50 MG PO TABS: 25.0000 mg | ORAL_TABLET | Freq: Every evening | ORAL | Status: DC | PRN

## 2017-11-26 MED ORDER — ALBUTEROL SULFATE (2.5 MG/3ML) 0.083% IN NEBU
2.5000 mg | INHALATION_SOLUTION | RESPIRATORY_TRACT | Status: DC | PRN
  Administered 2017-11-27 (×2): 2.5 mg via RESPIRATORY_TRACT
  Filled 2017-11-26 (×2): qty 3

## 2017-11-26 MED ORDER — DILTIAZEM LOAD VIA INFUSION
5.0000 mg | Freq: Once | INTRAVENOUS | Status: AC
  Administered 2017-11-26: 5 mg via INTRAVENOUS
  Filled 2017-11-26: qty 5

## 2017-11-26 MED ORDER — BOOST / RESOURCE BREEZE PO LIQD CUSTOM
1.0000 | Freq: Three times a day (TID) | ORAL | Status: DC
  Administered 2017-11-27: 1 via ORAL

## 2017-11-26 MED ORDER — SODIUM CHLORIDE 0.9 % IV SOLN
INTRAVENOUS | Status: DC
  Administered 2017-11-27 – 2017-11-28 (×2): via INTRAVENOUS

## 2017-11-26 MED ORDER — AMIODARONE HCL IN DEXTROSE 360-4.14 MG/200ML-% IV SOLN
60.0000 mg/h | INTRAVENOUS | Status: DC
  Administered 2017-11-26 – 2017-11-27 (×2): 60 mg/h via INTRAVENOUS
  Filled 2017-11-26: qty 200

## 2017-11-26 MED ORDER — ONDANSETRON 4 MG PO TBDP
4.0000 mg | ORAL_TABLET | Freq: Three times a day (TID) | ORAL | Status: DC | PRN
Start: 2017-11-26 — End: 2017-11-28
  Filled 2017-11-26: qty 1

## 2017-11-26 MED ORDER — SODIUM CHLORIDE 0.9 % IV BOLUS (SEPSIS)
1000.0000 mL | Freq: Once | INTRAVENOUS | Status: AC
  Administered 2017-11-26: 1000 mL via INTRAVENOUS

## 2017-11-26 MED ORDER — SUCRALFATE 1 GM/10ML PO SUSP
1.0000 g | Freq: Three times a day (TID) | ORAL | Status: DC
  Administered 2017-11-27 (×2): 1 g via ORAL
  Filled 2017-11-26 (×4): qty 10

## 2017-11-26 MED ORDER — ASPIRIN EC 81 MG PO TBEC: 81.0000 mg | DELAYED_RELEASE_TABLET | Freq: Every day | ORAL | Status: DC

## 2017-11-26 MED ORDER — SENNOSIDES-DOCUSATE SODIUM 8.6-50 MG PO TABS
1.0000 | ORAL_TABLET | Freq: Every day | ORAL | Status: DC
  Administered 2017-11-26 – 2017-11-27 (×2): 1 via ORAL
  Filled 2017-11-26 (×3): qty 1

## 2017-11-26 MED ORDER — DILTIAZEM HCL 100 MG IV SOLR
5.0000 mg/h | INTRAVENOUS | Status: DC
  Administered 2017-11-26: 5 mg/h via INTRAVENOUS
  Filled 2017-11-26 (×2): qty 100

## 2017-11-26 MED ORDER — AMIODARONE HCL IN DEXTROSE 360-4.14 MG/200ML-% IV SOLN
30.0000 mg/h | INTRAVENOUS | Status: DC
  Filled 2017-11-26 (×2): qty 200

## 2017-11-26 MED ORDER — AMIODARONE LOAD VIA INFUSION
150.0000 mg | Freq: Once | INTRAVENOUS | Status: AC
  Administered 2017-11-26: 150 mg via INTRAVENOUS
  Filled 2017-11-26: qty 83.34

## 2017-11-26 MED ORDER — HEPARIN BOLUS VIA INFUSION
4000.0000 [IU] | Freq: Once | INTRAVENOUS | Status: AC
  Administered 2017-11-26: 4000 [IU] via INTRAVENOUS
  Filled 2017-11-26: qty 4000

## 2017-11-26 MED ORDER — FUROSEMIDE 10 MG/ML IJ SOLN
20.0000 mg | Freq: Once | INTRAMUSCULAR | Status: AC
  Administered 2017-11-26: 20 mg via INTRAVENOUS
  Filled 2017-11-26: qty 2

## 2017-11-26 NOTE — ED Triage Notes (Signed)
Pt here from MD office with c/o aflutter 166 rate, pt received 2 -10mg   Cardizem ivp by ems , pt has lung CA but has not started treatment

## 2017-11-26 NOTE — Consult Note (Signed)
Cardiology Consultation:   Patient ID: Levi Brown; [id number]; 21-Aug-1956   Admit date: 11/25/2017 Date of Consult: 11/27/2017  Primary Care Provider: [name], FNP Primary Cardiologist: New, being seen by Dr. Harl Bowie (but lives in [place] so will establish with one of our weekend team)  Chief Complaint: shortness of breath  Patient Profile:   Levi Brown is a 61 y.o. male with a hx of Stage IV non-small cell lung cancer with left-sided obstructing mediastinal mass with complete occlusion of left upper lobe bronchus, contralateral paratracheal lymphadenopathy, hypermetabolic metastasis in the sternum, left pleural effusion diagnosed 06/2017, remote strokes unknown to patient seen on MRI, tobacco abuse who is being seen today for the evaluation of rapid atrial flutter at the request of Dr. Vanetta Shawl.  History of Present Illness:   Per oncology notes, he is s/p palliative radiotherapy to large left mediastinal mass and the sternum. He was admitted 08/2017 with large left pleural effusion requiring thoracentesis, exudative, atypical cells seen, difficult to know if due to malignancy. 2D echo at that time showed EF 55-60%, hypokinesis of anterior myocardium, mildly dilated LA, PASP 102mmHg, mild TR, normal RV. Systemic chemotherapy was recommended but patient canceled his appointments and wanted to think about this. He had not yet reached a decision by 12/6 appointment. Hospice was also offered as an option. He has felt more SOB with decreased appetite x 3 days. He was seen by primary care in the office was noted to be rapidly tachycardic and hypotensive. He had not taken his metoprolol today. He was also prescribed azithromycin approximately 2 weeks ago to treat presumptive bronchitis versus pneumonia but did not take that medication. He was found to be very rapid atrial flutter and was sent over to [facility] for admission. He was placed on diltiazem without any improvement  in HR. He also received 1 L saline bolus. Labs show hyponatremia of 133, troponin negative, continued anemia with Hgb of 9.1 (recently in the 7-9 range), TSH 4.5. He is currently tachycardic at  169bpm with BP 91/64. His BP has been as low as 76/65 with this rhythm. CXR without substantial change from prior. He continues to feel quite SOB without significant change. Maintaining 98% on 2L. Quit smoking 4 years ago. Currently drinking 1 12 pack case of beer a week.   Past Medical History:  Diagnosis Date  . Former smoker    Quit smoking in early 2017  . Non-small cell cancer of left lung ([place]) 07/13/2017  . Stroke [facility]) 07/07/2017   [place]    Past Surgical History:  Procedure Laterality Date  . VIDEO BRONCHOSCOPY Bilateral 07/07/2017   Procedure: VIDEO BRONCHOSCOPY WITHOUT FLUORO;  Surgeon: Collene Gobble, MD;  Location: [facility] ENDOSCOPY;  Service: Cardiopulmonary;  Laterality: Bilateral;     Inpatient Medications: Scheduled Meds: . amiodarone  150 mg Intravenous Once  . aspirin EC  81 mg Oral Daily  . feeding supplement  1 Container Oral TID BM  . ferrous sulfate  325 mg Oral BID WC  . senna-docusate  1-2 tablet Oral QHS  . [START ON 11/27/2017] sucralfate  1 g Oral TID WC   Continuous Infusions: . sodium chloride    . amiodarone     Followed by  . [START ON 11/27/2017] amiodarone    . diltiazem (CARDIZEM) infusion 10 mg/hr (12/12/2017 1503)  . heparin 1,000 Units/hr (12/08/2017 1733)   PRN Meds: albuterol, ipratropium-albuterol, ondansetron, prochlorperazine, traZODone  Home Meds: Prior to Admission medications  Medication Sig Start Date End Date Taking? Authorizing Provider  albuterol (PROVENTIL HFA;VENTOLIN HFA) 108 (90 Base) MCG/ACT inhaler Inhale 2 puffs into the lungs every 4 (four) hours as needed for wheezing or shortness of breath (cough, shortness of breath or wheezing.). 08/17/17  Yes Jegede, Olugbemiga E, MD  aspirin EC 81 MG tablet Take 1 tablet (81 mg total) by mouth  daily. 07/08/17 07/08/18 Yes Florencia Reasons, MD  feeding supplement (BOOST / RESOURCE BREEZE) LIQD Take 1 Container by mouth 3 (three) times daily between meals. 09/04/17  Yes Florencia Reasons, MD  ferrous sulfate (FERROUSUL) 325 (65 FE) MG tablet Take 1 tablet (325 mg total) by mouth 2 (two) times daily with a meal. 09/29/17  Yes [name], FNP  furosemide (LASIX) 20 MG tablet Take 1 tablet (20 mg total) by mouth daily as needed. 08/23/17  Yes [name], FNP  ipratropium-albuterol (DUONEB) 0.5-2.5 (3) MG/3ML SOLN Take 3 mLs by nebulization every 4 (four) hours as needed. 08/31/17  Yes Jani Gravel, MD  metoprolol tartrate (LOPRESSOR) 25 MG tablet Take 1 tablet (25 mg total) by mouth daily. 09/29/17  Yes [name], FNP  Multiple Vitamins-Minerals (DRY EYE FORMULA PO) Place 1 drop into both eyes daily.   Yes [provider]  ondansetron (ZOFRAN ODT) 4 MG disintegrating tablet Take 1 tablet (4 mg total) by mouth every 8 (eight) hours as needed for nausea or vomiting. 08/23/17  Yes [name], FNP  potassium chloride SA (K-DUR,KLOR-CON) 20 MEQ tablet Take 1 tablet (20 mEq total) by mouth daily. 08/17/17  Yes [name], FNP  predniSONE (DELTASONE) 20 MG tablet Take 20 mg by mouth daily with breakfast. 11/08/17  Yes [provider]  prochlorperazine (COMPAZINE) 10 MG tablet Take 1 tablet (10 mg total) by mouth every 6 (six) hours as needed for nausea or vomiting. 09/22/17  Yes Curt Bears, MD  promethazine-codeine [facility] WITH CODEINE) 6.25-10 MG/5ML syrup Take 5 mLs by mouth every 6 (six) hours as needed for cough. 11/12/17  Yes [name], FNP  senna-docusate (SENOKOT-S) 8.6-50 MG tablet Take 1-2 tablets by mouth at bedtime. Patient taking differently: Take 1-2 tablets by mouth at bedtime as needed for mild constipation.  09/15/17  Yes [name], FNP  sucralfate (CARAFATE) 1 GM/10ML suspension Take 10 mLs (1 g total) by mouth 3 (three) times  daily with meals. Patient taking differently: Take 1 g by mouth 3 (three) times daily as needed (ulcers).  09/15/17  Yes [name], FNP  traZODone (DESYREL) 50 MG tablet Take 0.5-1 tablets (25-50 mg total) by mouth at bedtime as needed for sleep. 08/23/17  Yes [name], FNP  azithromycin (ZITHROMAX) 250 MG tablet Take 2 tabs PO x 1 dose, then 1 tab PO QD x 4 days Patient taking differently: Take 250 mg by mouth See admin instructions. Take 2 tabs PO x 1 dose, then 1 tab PO QD x 4 days 11/12/17   [name], FNP    Allergies:   No Known Allergies  Social History:   Social History   Socioeconomic History  . Marital status: Single    Spouse name: Not on file  . Number of children: Not on file  . Years of education: Not on file  . Highest education level: Not on file  Social Needs  . Financial resource strain: Not on file  . Food insecurity - worry: Not on file  . Food insecurity - inability: Not on  file  . Transportation needs - medical: Not on file  . Transportation needs - non-medical: Not on file  Occupational History  . Not on file  Tobacco Use  . Smoking status: Former Smoker    Packs/day: 1.00    Years: 30.00    Pack years: 30.00    Types: Cigarettes    Last attempt to quit: 2017    Years since quitting: 1.9  . Smokeless tobacco: Never Used  . Tobacco comment: quit smoking in 2017  Substance and Sexual Activity  . Alcohol use: Yes    Alcohol/week: 8.4 - 12.6 oz    Types: 14 - 21 Cans of beer per week  . Drug use: No  . Sexual activity: No  Other Topics Concern  . Not on file  Social History Narrative  . Not on file    Family History:   The patient's family history includes Asthma in his mother; COPD in his mother; Cancer in his mother.  ROS:  Please see the history of present illness. No h/o bleeding all other ROS reviewed and negative.     Physical Exam/Data:   Vitals:   11/16/2017 1715 11/18/2017 1735 12/02/2017 1750 12/04/2017 1753  BP:  (!) 89/63 (!) 89/75 94/79   Pulse:   (!) 151   Resp: (!) 26 19    Temp:    98.7 F (37.1 C)  TempSrc:    Oral  SpO2:   100% 98%  Weight:    160 lb 0.9 oz (72.6 kg)    Intake/Output Summary (Last 24 hours) at 11/28/2017 1855 Last data filed at 12/13/2017 1405 Gross per 24 hour  Intake 1000 ml  Output -  Net 1000 ml   Filed Weights   11/19/2017 1753  Weight: 160 lb 0.9 oz (72.6 kg)   Body mass index is 26.63 kg/m.  General: Well developed, well nourished AAM, in no acute distress. Head: Normocephalic, atraumatic, sclera non-icteric, no xanthomas, nares are without discharge.  Neck: Negative for carotid bruits. JVD not elevated. Lungs: Diminished BS at bases as well as markedly diminshed on L>R. No acute distress but mild increase WOB Heart: Tachycardic with S1 S2. No murmurs, rubs, or gallops appreciated. Abdomen: Soft, non-tender, non-distended with normoactive bowel sounds. No hepatomegaly. No rebound/guarding. No obvious abdominal masses. Msk:  Strength and tone appear normal for age. Extremities: No clubbing or cyanosis. No edema.  Distal pedal pulses are 2+ and equal bilaterally. Neuro: Alert and oriented X 3. No facial asymmetry. No focal deficit. Moves all extremities spontaneously. Psych:  Responds to questions appropriately with a normal affect.  EKG:  The EKG was personally reviewed and demonstrates rapid atrial flutter with nonspecific ST-T changes difficult to assess with rapid rate  Relevant CV Studies: 2D Echo 08/29/17 Study Conclusions  - Left ventricle: The cavity size was normal. There was mild   concentric hypertrophy. Systolic function was normal. The   estimated ejection fraction was in the range of 55% to 60%. Mild   hypokinesis of the anterior myocardium. - Aortic valve: Transvalvular velocity was within the normal range.   There was no stenosis. There was no regurgitation. - Mitral valve: Transvalvular velocity was within the normal range.   There was  no evidence for stenosis. There was no regurgitation. - Left atrium: The atrium was mildly dilated. - Right ventricle: The cavity size was normal. Wall thickness was   normal. Systolic function was normal. - Tricuspid valve: There was mild regurgitation. - Pulmonary arteries: Systolic pressure  was mildly increased. PA   peak pressure: 40 mm Hg (S).  Laboratory Data:  Chemistry Recent Labs  Lab 11/18/2017 1205 12/03/2017 1225  NA 133* 133*  K 3.8 3.8  CL 83* 80*  CO2 42*  --   GLUCOSE 106* 108*  BUN 5* 6  CREATININE 0.43* 0.50*  CALCIUM 9.1  --   GFRNONAA >60  --   GFRAA >60  --   ANIONGAP 8  --     No results for input(s): PROT, ALBUMIN, AST, ALT, ALKPHOS, BILITOT in the last 168 hours. Hematology Recent Labs  Lab 11/29/2017 1205 11/18/2017 1225  WBC 11.3*  --   RBC 3.64*  --   HGB 9.1* 11.2*  HCT 30.1* 33.0*  MCV 82.7  --   MCH 25.0*  --   MCHC 30.2  --   RDW 17.9*  --   PLT 483*  --     Recent Labs  Lab 11/16/2017 1223  TROPIPOC 0.00    BNPNo results for input(s): BNP, PROBNP in the last 168 hours.  DDimer No results for input(s): DDIMER in the last 168 hours.  Radiology/Studies:  Dg Chest Port 1 View  Result Date: 12/01/2017 CLINICAL DATA:  Shortness of breath EXAM: PORTABLE CHEST 1 VIEW COMPARISON:  11/13/2017 FINDINGS: The cardio pericardial silhouette is enlarged. Asymmetric elevation left hemidiaphragm. Large left hilar and mediastinal lung mass again noted. Pleural effusion again noted on the left. Right lung relatively clear. The visualized bony structures of the thorax are intact. Telemetry leads overlie the chest. IMPRESSION: No substantial change. Asymmetric elevation left hemidiaphragm with large left hilar/mediastinal mass and left base collapse/consolidation with pleural effusion. Electronically Signed   By: Misty Stanley M.D.   On: 11/18/2017 12:19    Assessment and Plan:   1. Rapid atrial flutter - resistant to diltiazem drip, unable to titrate any  further due to hypotension. Digoxin is rarely effective for lowering HR in this situation. Suspect his metastatic lung cancer and intrathoracic mass is contributing to physiologic stress, producing this rhythm. Atrial arrhythmias can become very difficult to control in the setting of advancing cancers. Our best option here is IV amiodarone bolus and drip. This is less than ideal situation since he has been presumably in this rhythm for over 48 hours (by report, 3 days of symptoms) as there is risk of conversion to NSR and subsequent stroke, but with his ongoing dyspnea and hypotension, there is no other good option either pharmacologically or mechanically. Vagal maneuvers and carotid massage were ineffective. Per d/w Dr. Harl Bowie, will initiate IV amiodarone and follow rhythm. He is currently on IV heparin and decisions will have to be made regarding transition to oral form (I am not sure if his pleural effusion will require repeat thoracentesis this admission).   2. Metastatic lung cancer with recurrent left pleural effusion - would recommend IM keep oncology informed of patients admission to discuss further plans and goals of care.  3. Former tobacco abuse - has remained abstinent for 4 years.  4. Current alcohol use - primary team is monitoring for withdrawal with [facility].   5. Abnormal TSH - fT4 pending.  6. Downtrending hemoglobin since CA dx - possibly related to chronic disease, will need to monitor while on anticoagulation.  For questions or updates, please contact [place] Please consult [email or web address] for contact info under Cardiology/STEMI.    Signed, Charlie Pitter, PA-C  11/15/2017 6:55 PM  Patient seen and discussed with PA Dunn, I agree with  her documentation above. History of nonssmall cell lung CA. Notes indicate history of left sided obstructing lung mass with complete occlusion of LUL bronchus, right paratracheal lymphadenopathy and mets in sternum and a left pleural effusion.  S/p course of palliative radiotherapy. Recs were made for chemo but patient had declined initially. Onc notes mention incurable cancer, and that treatment is solely palliative.   Admitted with new onset aflutter. He denies any specific palpitations, just woresning fatigue and SOB over the last several days.  Hgb 9.1, Plt 483, K 3.8, Cr 0.43, TSH 4.5,  Trop neg EKG aflutter with RVR Echo LVEF 55-60%, mild LAE.   Patient with essentionally incurable metastatic lung CA (no intracranial disease) presents with new onset aflutter with RVR. Hypotension limits treatment options, we will start amiodarone. 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 His CHADS2Vasc score by known history is 0, he will not require long term anticoag. Currently on heparin, since he may convert on amio reasonable for anticoag for a short duration, currently on heparin drip. SBP low 90s on dilt gtt at 10, will d/c dilt drip and start amio. If tolerates amio from bp standpoint could consider adding back dilt gtt if additional therapy is needed.   Carlyle Dolly MD

## 2017-11-26 NOTE — H&P (Signed)
[facility] Admission History and Physical Service Pager: [phone]  Patient name: Levi Brown Medical record number: [id number] Date of birth: 1956/04/14 Age: 61 y.o. Gender: male  Primary Care Provider: [name], FNP Consultants: cardiology Code Status: full cold  Chief Complaint: shortness of breath  Assessment and Plan: Levi Brown is a 61 y.o. male presenting with shortness of breath . PMH is significant for non-small cell lung cancer diagnosed in July 2018, HTN, hx tobacco abuse, hx alcohol abuse, COPD  New dx of Aflutter: HR in ED 160's, on dilt drip. Blood pressure low, received 1L bolus. TSH mildly elevated at 4.54. Likely this is caused by his advancing pulmonary disease (COPD) made worse by his recent diagnosis of [place]. Patient states that for 3 days he has had SOB. He does state his SOB is worse on exertion. He has not had this before and denies any chest pain, palpitations, dizziness or any other associated symptoms. He went into his PCP's office for the SOB was found to have a rapid heart rate with hypotension and sent to the ED. While in the ED he was found to be in atrial flutter, cardiology was consulted and he was started on diltalizem drip. -admit to step down, attending Dr. Ardelia Mems -vitals per floor -monitor on tele and continuous pulse ox  -continue O2 supplementation (On home 2L, requiring 5L now) -cardiology following, appreciate recommendations -continue dilt drip and titrate up to control heart rate per cards -trend trop, am EKG -am CBC, BMP -check free T4 as TSH was elevated -repeat fluid bolus as BP low, continue IV fluids  Hx alcohol abuse/marijuana use: patient denies current alcohol use, endorses marijuana use -monitor on CIWA -collect UDS  NSCLC: Chronic. Patient reports being dianosed on July 23rd, 2018. He is following with Dr. Inda Merlin with oncology. - currently undergoing radiation treatment, no  chemotherapy yet. - will FYI oncologist about admission  COPD: Chronic. 30 pack year history, quit smoking 4 years ago. Patient states he has inhalers at home but he has not needed them recently. He is on 2L O2 at home. Requiring 5L O2 currently. -albuterol PRN -duonebs q4 PRN -cont to monitor pulse ox  Severe Protein Calorie Malnutrition: Patient has [place] and has been requiring boost supplementation. His appetite has been normal but he has been unable to eat as much due to early satiety. Probably due to radiation therapy for [place]. - c/s nutrition - cont boost supplements that he has been eating at home. - obtain serum albumin - check recent weights  FEN/GI: regular diet Prophylaxis: lovenox  Disposition: admit to step down  History of Present Illness:  Levi Brown is a 61 y.o. male presenting with shortness of breath  Reports he has been SOB for the past 3 days so went to see his PCP today, they saw his high HR and sent him to the ED where he was found to be in flutter. He felt well otherwise. Denies palpitations or feeling like heart was racing. No chest pain. He reports no dizziness, no syncope, no fainting. He does state his SOB is worse on exertion. He also reports he has a normal appetite but gets full very easily and has not been eating as much. On 2L O2 at home due to lung cancer. Currently on 5L. Has been getting radiation treatment to his chest since July. Has been losing his voice since starting radiation. He currently feels comfortable on 5L of O2. Dx w/ lung  cancer in July, no hx HTN, DM. 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 Patient is a former smoker, quit about 4 years ago, smoked about 30 years pack a day. Drinks a case of beer a week, reports no current ETOCH use. He does endorse marijuana use but none recently, no other drug use. He had an uncle that had lung cancer. No other contributory FH.  Review Of Systems: Per HPI with the following additions:   Review  of Systems  Constitutional: Negative for chills and fever.  HENT: Negative for sore throat.        Hoarse voice  Eyes: Negative for blurred vision and double vision.  Respiratory: Positive for shortness of breath. Negative for cough, hemoptysis and wheezing.   Cardiovascular: Negative for chest pain and palpitations.  Gastrointestinal: Negative for abdominal pain, constipation, diarrhea, heartburn, nausea and vomiting.       Decreased appetite. Early satiety  Musculoskeletal: Negative for falls.  Neurological: Negative for sensory change, focal weakness, seizures, loss of consciousness and weakness.  Psychiatric/Behavioral: Positive for substance abuse (marijuana). The patient is not nervous/anxious.     Patient Active Problem List   Diagnosis Date Noted  . Atrial flutter ([place]) 11/16/2017  . Palliative care by specialist   . DNR (do not resuscitate) discussion   . Edema 08/31/2017  . Hypokalemia 08/29/2017  . Acute respiratory failure with hypoxia and hypercapnia ([place]) 08/26/2017  . Pleural effusion on left 08/26/2017  . Collapse of left lung 08/26/2017  . Hyponatremia 08/26/2017  . COPD exacerbation ([place]) 08/26/2017  . Obstructive pneumonia 08/26/2017  . Primary malignant neoplasm of lung metastatic to other site [facility])   . Non-small cell cancer of left lung ([place]) 07/13/2017  . Elevated blood pressure reading 07/05/2017    Past Medical History: Past Medical History:  Diagnosis Date  . Former smoker    Quit smoking in early 2017  . Non-small cell cancer of left lung ([place]) 07/13/2017  . Stroke [facility]) 07/07/2017   [place]    Past Surgical History: Past Surgical History:  Procedure Laterality Date  . VIDEO BRONCHOSCOPY Bilateral 07/07/2017   Procedure: VIDEO BRONCHOSCOPY WITHOUT FLUORO;  Surgeon: Collene Gobble, MD;  Location[facility] ENDOSCOPY;  Service: Cardiopulmonary;  Laterality: Bilateral;    Social History: Social History   Tobacco Use  . Smoking status: Former Smoker     Packs/day: 1.00    Years: 30.00    Pack years: 30.00    Types: Cigarettes    Last attempt to quit: 2017    Years since quitting: 1.9  . Smokeless tobacco: Never Used  . Tobacco comment: quit smoking in 2017  Substance Use Topics  . Alcohol use: Yes    Alcohol/week: 8.4 - 12.6 oz    Types: 14 - 21 Cans of beer per week  . Drug use: No   Additional social history: smokes marijuana, lives with uncle Please also refer to relevant sections of EMR.  Family History: Family History  Problem Relation Age of Onset  . Asthma Mother   . COPD Mother   . Cancer Mother        breast?   Allergies and Medications: No Known Allergies No current facility-administered medications on file prior to encounter.    Current Outpatient Medications on File Prior to Encounter  Medication Sig Dispense Refill  . albuterol (PROVENTIL HFA;VENTOLIN HFA) 108 (90 Base) MCG/ACT inhaler Inhale 2 puffs into the lungs every 4 (four) hours as needed for wheezing or shortness  of breath (cough, shortness of breath or wheezing.). 54 g 3  . aspirin EC 81 MG tablet Take 1 tablet (81 mg total) by mouth daily. 30 tablet 0  . azithromycin (ZITHROMAX) 250 MG tablet Take 2 tabs PO x 1 dose, then 1 tab PO QD x 4 days (Patient not taking: Reported on 11/22/2017) 6 tablet 0  . feeding supplement (BOOST / RESOURCE BREEZE) LIQD Take 1 Container by mouth 3 (three) times daily between meals. 30 Container 0  . ferrous sulfate (FERROUSUL) 325 (65 FE) MG tablet Take 1 tablet (325 mg total) by mouth 2 (two) times daily with a meal. 60 tablet 3  . furosemide (LASIX) 20 MG tablet Take 1 tablet (20 mg total) by mouth daily as needed. 30 tablet 1  . ipratropium-albuterol (DUONEB) 0.5-2.5 (3) MG/3ML SOLN Take 3 mLs by nebulization every 4 (four) hours as needed. 360 mL 0  . metoprolol tartrate (LOPRESSOR) 25 MG tablet Take 1 tablet (25 mg total) by mouth daily. 30 tablet 3  . Multiple Vitamins-Minerals (DRY EYE FORMULA PO) Place 1 drop  into both eyes daily.    . ondansetron (ZOFRAN ODT) 4 MG disintegrating tablet Take 1 tablet (4 mg total) by mouth every 8 (eight) hours as needed for nausea or vomiting. 30 tablet 0  . potassium chloride SA (K-DUR,KLOR-CON) 20 MEQ tablet Take 1 tablet (20 mEq total) by mouth daily. 5 tablet 0  . predniSONE (DELTASONE) 20 MG tablet Take 20 mg by mouth daily with breakfast.  1  . prochlorperazine (COMPAZINE) 10 MG tablet Take 1 tablet (10 mg total) by mouth every 6 (six) hours as needed for nausea or vomiting. 30 tablet 0  . promethazine-codeine (PHENERGAN WITH CODEINE) 6.25-10 MG/5ML syrup Take 5 mLs by mouth every 6 (six) hours as needed for cough. 120 mL 0  . senna-docusate (SENOKOT-S) 8.6-50 MG tablet Take 1-2 tablets by mouth at bedtime. 60 tablet 0  . sucralfate (CARAFATE) 1 GM/10ML suspension Take 10 mLs (1 g total) by mouth 3 (three) times daily with meals. 420 mL 0  . traZODone (DESYREL) 50 MG tablet Take 0.5-1 tablets (25-50 mg total) by mouth at bedtime as needed for sleep. 60 tablet 3    Objective: BP 91/67   Pulse (!) 165   Resp 19   SpO2 100%  Exam: General: A&O x 3; NAD Eyes: PERRLA, EOMI ENTM: MMM, normal pharyngeal mucosa, no exudates Cardiovascular: tachycardic, reg rhythm, no murmurs, +radial pulses bilaterally  Respiratory: decreased breath sounds in lower lung fields bilaterally, no crackles, no wheezing, normal work of breathing Gastrointestinal: non-distended, non-tender, soft, +bs MSK: FROM in all extremities, no edema or cyanosis Derm: warm, dry, intact, no rashes Neuro: CN II-XII grossly intact, 5/5 strength in upper and lower extremities Psych: appropriate and pleasant mood  Labs and Imaging: CBC BMET  Recent Labs  Lab 12/01/2017 1205 12/01/2017 1225  WBC 11.3*  --   HGB 9.1* 11.2*  HCT 30.1* 33.0*  PLT 483*  --    Recent Labs  Lab 11/18/2017 1205 12/06/2017 1225  NA 133* 133*  K 3.8 3.8  CL 83* 80*  CO2 42*  --   BUN 5* 6  CREATININE 0.43* 0.50*   GLUCOSE 106* 108*  CALCIUM 9.1  --      Dg Chest Port 1 View  Result Date: 11/13/2017 CLINICAL DATA:  Shortness of breath EXAM: PORTABLE CHEST 1 VIEW COMPARISON:  11/13/2017 FINDINGS: The cardio pericardial silhouette is enlarged. Asymmetric elevation left hemidiaphragm. Large  left hilar and mediastinal lung mass again noted. Pleural effusion again noted on the left. Right lung relatively clear. The visualized bony structures of the thorax are intact. Telemetry leads overlie the chest. IMPRESSION: No substantial change. Asymmetric elevation left hemidiaphragm with large left hilar/mediastinal mass and left base collapse/consolidation with pleural effusion. Electronically Signed   By: Misty Stanley M.D.   On: 12/07/2017 12:19     Nuala Alpha, DO 11/14/2017, 4:50 PM PGY-1, [facility] Intern pager: [phone], text pages welcome  FPTS Upper-Level Resident Addendum  I have independently interviewed and examined the patient. I have discussed the above with the original author and agree with their documentation. My edits for correction/addition/clarification are in pink.Please see also any attending notes.   Lucila Maine, DO PGY-2, [facility] Service pager: [phone] (text pages welcome through [place])

## 2017-11-26 NOTE — Progress Notes (Addendum)
Patient ID: Levi Brown, male    DOB: September 09, 1956, 61 y.o.   MRN: [id number]  PCP: [name], FNP  Chief Complaint  Patient presents with  . Nasal Congestion  . Shortness of Breath    Subjective:  HPI Levi Brown is a 61 y.o. male with non-small cell lung cancer, presents with labored breathing. Upon arrival in room the patient appeared notably in distress. He was on 3 liters of oxygen via nasal cannula. Upon auscultation, he was noted to have a very rapid heart rhythm. EKG confirmed atrial flutter with 2:1 AV conduction, rate 168-170 BPM. He was hypotensive. EMS called.  Patient admits that he has not taken his metoprolol today.  He was also prescribed azithromycin approximately 2 weeks ago to treat presumptive bronchitis versus pneumonia and he notes that he did not take that medication.  He has been feeling poorly for a total of 3 days with worsening shortness of breath. He denies fever, diaphoresis, and chest pain. Social History   Socioeconomic History  . Marital status: Single    Spouse name: Not on file  . Number of children: Not on file  . Years of education: Not on file  . Highest education level: Not on file  Social Needs  . Financial resource strain: Not on file  . Food insecurity - worry: Not on file  . Food insecurity - inability: Not on file  . Transportation needs - medical: Not on file  . Transportation needs - non-medical: Not on file  Occupational History  . Not on file  Tobacco Use  . Smoking status: Former Smoker    Packs/day: 1.00    Years: 30.00    Pack years: 30.00    Types: Cigarettes    Last attempt to quit: 2017    Years since quitting: 1.9  . Smokeless tobacco: Never Used  . Tobacco comment: quit smoking in 2017  Substance and Sexual Activity  . Alcohol use: Yes    Alcohol/week: 8.4 - 12.6 oz    Types: 14 - 21 Cans of beer per week  . Drug use: No  . Sexual activity: No  Other Topics Concern  . Not on file  Social History  Narrative  . Not on file    Family History  Problem Relation Age of Onset  . Asthma Mother   . COPD Mother   . Cancer Mother        breast?    Review of Systems  Constitutional: Positive for fatigue.  Respiratory: Positive for shortness of breath.   Cardiovascular: Positive for palpitations.  Neurological: Negative.   Psychiatric/Behavioral: Negative.      Patient Active Problem List   Diagnosis Date Noted  . Palliative care by specialist   . DNR (do not resuscitate) discussion   . Edema 08/31/2017  . Hypokalemia 08/29/2017  . Acute respiratory failure with hypoxia and hypercapnia ([place]) 08/26/2017  . Pleural effusion on left 08/26/2017  . Collapse of left lung 08/26/2017  . Hyponatremia 08/26/2017  . COPD exacerbation ([place]) 08/26/2017  . Obstructive pneumonia 08/26/2017  . Primary malignant neoplasm of lung metastatic to other site [facility])   . Non-small cell cancer of left lung ([place]) 07/13/2017  . Elevated blood pressure reading 07/05/2017    No Known Allergies  Prior to Admission medications   Medication Sig Start Date End Date Taking? Authorizing Provider  albuterol (PROVENTIL HFA;VENTOLIN HFA) 108 (90 Base) MCG/ACT inhaler Inhale 2 puffs into the lungs every 4 (four)  hours as needed for wheezing or shortness of breath (cough, shortness of breath or wheezing.). 08/17/17  Yes Jegede, Olugbemiga E, MD  aspirin EC 81 MG tablet Take 1 tablet (81 mg total) by mouth daily. 07/08/17 07/08/18 Yes Florencia Reasons, MD  feeding supplement (BOOST / RESOURCE BREEZE) LIQD Take 1 Container by mouth 3 (three) times daily between meals. 09/04/17  Yes Florencia Reasons, MD  ferrous sulfate (FERROUSUL) 325 (65 FE) MG tablet Take 1 tablet (325 mg total) by mouth 2 (two) times daily with a meal. 09/29/17  Yes [name], FNP  furosemide (LASIX) 20 MG tablet Take 1 tablet (20 mg total) by mouth daily as needed. 08/23/17  Yes [name], FNP  ipratropium-albuterol (DUONEB) 0.5-2.5 (3) MG/3ML SOLN  Take 3 mLs by nebulization every 4 (four) hours as needed. 08/31/17  Yes Jani Gravel, MD  metoprolol tartrate (LOPRESSOR) 25 MG tablet Take 1 tablet (25 mg total) by mouth daily. 09/29/17  Yes [name], FNP  Multiple Vitamins-Minerals (DRY EYE FORMULA PO) Place 1 drop into both eyes daily.   Yes [provider]  ondansetron (ZOFRAN ODT) 4 MG disintegrating tablet Take 1 tablet (4 mg total) by mouth every 8 (eight) hours as needed for nausea or vomiting. 08/23/17  Yes [name], FNP  potassium chloride SA (K-DUR,KLOR-CON) 20 MEQ tablet Take 1 tablet (20 mEq total) by mouth daily. 08/17/17  Yes [name], FNP  prochlorperazine (COMPAZINE) 10 MG tablet Take 1 tablet (10 mg total) by mouth every 6 (six) hours as needed for nausea or vomiting. 09/22/17  Yes Curt Bears, MD  promethazine-codeine [facility] WITH CODEINE) 6.25-10 MG/5ML syrup Take 5 mLs by mouth every 6 (six) hours as needed for cough. 11/12/17  Yes [name], FNP  senna-docusate (SENOKOT-S) 8.6-50 MG tablet Take 1-2 tablets by mouth at bedtime. 09/15/17  Yes [name], FNP  sucralfate (CARAFATE) 1 GM/10ML suspension Take 10 mLs (1 g total) by mouth 3 (three) times daily with meals. 09/15/17  Yes [name], FNP  traZODone (DESYREL) 50 MG tablet Take 0.5-1 tablets (25-50 mg total) by mouth at bedtime as needed for sleep. 08/23/17  Yes [name], FNP  azithromycin (ZITHROMAX) 250 MG tablet Take 2 tabs PO x 1 dose, then 1 tab PO QD x 4 days Patient not taking: Reported on 11/13/2017 11/12/17   [name], FNP  predniSONE (DELTASONE) 20 MG tablet Take 20 mg by mouth daily with breakfast. 11/08/17   [provider]    Past Medical, Surgical Family and Social History reviewed and updated.    Objective:   Today's Vitals   11/20/2017 1037  Weight: 160 lb (72.6 kg)  Height: 5\' 5"  (1.651 m)    Wt Readings from Last 3 Encounters:  12/05/2017 160 lb (72.6 kg)   11/18/17 152 lb 12.8 oz (69.3 kg)  11/13/17 156 lb (70.8 kg)    Physical Exam  Constitutional: He is oriented to person, place, and time. He appears ill. Nasal cannula in place.  Pulmonary/Chest: He is in respiratory distress.  Oxygen increased to 4 liters. Positive labored breathing with accessory muscle use  Neurological: He is alert and oriented to person, place, and time.   Assessment & Plan:  1. Atrial flutter with rapid ventricular response ([place]),    2. Labored breathing Is oxygen increased to 4 L via nasal cannula.  Patient remained on EKG until EMS arrived. Monitored rhythm.  Heart rate never dropped below  168.  He remained responsive, alert, oriented.  Patient sent emergently via EMS to [place]. Kenton Kingfisher, MSN, FNP-C [facility]  [address] [place],  [address] [phone]

## 2017-11-26 NOTE — ED Notes (Signed)
[employer] paged to Dr. Tyrone Nine @ 1425 to [phone].

## 2017-11-26 NOTE — Progress Notes (Signed)
Nurse paged to clarify re: dilt and amio running together. Since diltiazem has been ineffective and he remains hypotensive, will hold dilt as we are starting amio. Dayna Dunn PA-C

## 2017-11-26 NOTE — ED Provider Notes (Signed)
[facility] EMERGENCY DEPARTMENT Provider Note   CSN: [id number] Arrival date & time: 11/13/2017  1150     History   Chief Complaint Chief Complaint  Patient presents with  . Atrial Flutter    HPI Levi Brown is a 61 y.o. male.  61 yo M with a chief complaint of shortness of breath.  This been going on for the past couple weeks.  It got worse yesterday.  The patient went to see his family doctor today and was noted to be in atrial flutter with heart rate in the 170s.  He was then instructed to come to the emergency department.  The patient denies any chest pain denies lower extremity edema.  Denies fevers or chills.  Denies pain.   The history is provided by the patient.  Illness  This is a new problem. The current episode started yesterday. The problem occurs constantly. The problem has been gradually worsening. Associated symptoms include shortness of breath. Pertinent negatives include no chest pain, no abdominal pain and no headaches. Nothing aggravates the symptoms. Nothing relieves the symptoms. He has tried nothing for the symptoms. The treatment provided no relief.    Past Medical History:  Diagnosis Date  . Former smoker    Quit smoking in early 2017  . Non-small cell cancer of left lung ([place]) 07/13/2017  . Stroke [facility]) 07/07/2017   [place]    Patient Active Problem List   Diagnosis Date Noted  . Atrial flutter ([place]) 11/22/2017  . Palliative care by specialist   . DNR (do not resuscitate) discussion   . Edema 08/31/2017  . Hypokalemia 08/29/2017  . Acute respiratory failure with hypoxia and hypercapnia ([place]) 08/26/2017  . Pleural effusion on left 08/26/2017  . Collapse of left lung 08/26/2017  . Hyponatremia 08/26/2017  . COPD exacerbation ([place]) 08/26/2017  . Obstructive pneumonia 08/26/2017  . Primary malignant neoplasm of lung metastatic to other site [facility])   . Non-small cell cancer of left lung ([place]) 07/13/2017  . Elevated blood  pressure reading 07/05/2017    Past Surgical History:  Procedure Laterality Date  . VIDEO BRONCHOSCOPY Bilateral 07/07/2017   Procedure: VIDEO BRONCHOSCOPY WITHOUT FLUORO;  Surgeon: Collene Gobble, MD;  Location: [facility] ENDOSCOPY;  Service: Cardiopulmonary;  Laterality: Bilateral;       Home Medications    Prior to Admission medications   Medication Sig Start Date End Date Taking? Authorizing Provider  albuterol (PROVENTIL HFA;VENTOLIN HFA) 108 (90 Base) MCG/ACT inhaler Inhale 2 puffs into the lungs every 4 (four) hours as needed for wheezing or shortness of breath (cough, shortness of breath or wheezing.). 08/17/17   Tresa Garter, MD  aspirin EC 81 MG tablet Take 1 tablet (81 mg total) by mouth daily. 07/08/17 07/08/18  Florencia Reasons, MD  azithromycin (ZITHROMAX) 250 MG tablet Take 2 tabs PO x 1 dose, then 1 tab PO QD x 4 days Patient not taking: Reported on 11/30/2017 11/12/17   [name], FNP  feeding supplement (BOOST / RESOURCE BREEZE) LIQD Take 1 Container by mouth 3 (three) times daily between meals. 09/04/17   Florencia Reasons, MD  ferrous sulfate (FERROUSUL) 325 (65 FE) MG tablet Take 1 tablet (325 mg total) by mouth 2 (two) times daily with a meal. 09/29/17   [name], FNP  furosemide (LASIX) 20 MG tablet Take 1 tablet (20 mg total) by mouth daily as needed. 08/23/17   [name], FNP  ipratropium-albuterol (DUONEB) 0.5-2.5 (3) MG/3ML SOLN  Take 3 mLs by nebulization every 4 (four) hours as needed. 08/31/17   Jani Gravel, MD  metoprolol tartrate (LOPRESSOR) 25 MG tablet Take 1 tablet (25 mg total) by mouth daily. 09/29/17   [name], FNP  Multiple Vitamins-Minerals (DRY EYE FORMULA PO) Place 1 drop into both eyes daily.    [provider]  ondansetron (ZOFRAN ODT) 4 MG disintegrating tablet Take 1 tablet (4 mg total) by mouth every 8 (eight) hours as needed for nausea or vomiting. 08/23/17   [name], FNP  potassium chloride SA  (K-DUR,KLOR-CON) 20 MEQ tablet Take 1 tablet (20 mEq total) by mouth daily. 08/17/17   [name], FNP  predniSONE (DELTASONE) 20 MG tablet Take 20 mg by mouth daily with breakfast. 11/08/17   [provider]  prochlorperazine (COMPAZINE) 10 MG tablet Take 1 tablet (10 mg total) by mouth every 6 (six) hours as needed for nausea or vomiting. 09/22/17   Curt Bears, MD  promethazine-codeine [facility] WITH CODEINE) 6.25-10 MG/5ML syrup Take 5 mLs by mouth every 6 (six) hours as needed for cough. 11/12/17   [name], FNP  senna-docusate (SENOKOT-S) 8.6-50 MG tablet Take 1-2 tablets by mouth at bedtime. 09/15/17   [name], FNP  sucralfate (CARAFATE) 1 GM/10ML suspension Take 10 mLs (1 g total) by mouth 3 (three) times daily with meals. 09/15/17   [name], FNP  traZODone (DESYREL) 50 MG tablet Take 0.5-1 tablets (25-50 mg total) by mouth at bedtime as needed for sleep. 08/23/17   [name], FNP    Family History Family History  Problem Relation Age of Onset  . Asthma Mother   . COPD Mother   . Cancer Mother        breast?    Social History Social History   Tobacco Use  . Smoking status: Former Smoker    Packs/day: 1.00    Years: 30.00    Pack years: 30.00    Types: Cigarettes    Last attempt to quit: 2017    Years since quitting: 1.9  . Smokeless tobacco: Never Used  . Tobacco comment: quit smoking in 2017  Substance Use Topics  . Alcohol use: Yes    Alcohol/week: 8.4 - 12.6 oz    Types: 14 - 21 Cans of beer per week  . Drug use: No     Allergies   Patient has no known allergies.   Review of Systems Review of Systems  Constitutional: Negative for chills and fever.  HENT: Negative for congestion and facial swelling.   Eyes: Negative for discharge and visual disturbance.  Respiratory: Positive for shortness of breath.   Cardiovascular: Negative for chest pain and palpitations.  Gastrointestinal: Negative for  abdominal pain, diarrhea and vomiting.  Musculoskeletal: Negative for arthralgias and myalgias.  Skin: Negative for color change and rash.  Neurological: Negative for tremors, syncope and headaches.  Psychiatric/Behavioral: Negative for confusion and dysphoric mood.     Physical Exam Updated Vital Signs BP 91/67   Pulse (!) 165   Resp 19   SpO2 100%   Physical Exam  Constitutional: He is oriented to person, place, and time. He appears well-developed and well-nourished.  HENT:  Head: Normocephalic and atraumatic.  Eyes: EOM are normal. Pupils are equal, round, and reactive to light.  Neck: Normal range of motion. Neck supple. No JVD present.  Cardiovascular: Regular rhythm. Tachycardia present. Exam reveals no gallop and no friction rub.  No murmur heard. Pulmonary/Chest:  No respiratory distress. He has no wheezes.  Abdominal: He exhibits no distension. There is no rebound and no guarding.  Musculoskeletal: Normal range of motion.  Neurological: He is alert and oriented to person, place, and time.  Skin: No rash noted. No pallor.  Psychiatric: He has a normal mood and affect. His behavior is normal.  Nursing note and vitals reviewed.    ED Treatments / Results  Labs (all labs ordered are listed, but only abnormal results are displayed) Labs Reviewed  CBC WITH DIFFERENTIAL/PLATELET - Abnormal; Notable for the following components:      Result Value   WBC 11.3 (*)    RBC 3.64 (*)    Hemoglobin 9.1 (*)    HCT 30.1 (*)    MCH 25.0 (*)    RDW 17.9 (*)    Platelets 483 (*)    Neutro Abs 8.4 (*)    Lymphs Abs 0.5 (*)    Monocytes Absolute 1.7 (*)    All other components within normal limits  BASIC METABOLIC PANEL - Abnormal; Notable for the following components:   Sodium 133 (*)    Chloride 83 (*)    CO2 42 (*)    Glucose, Bld 106 (*)    BUN 5 (*)    Creatinine, Ser 0.43 (*)    All other components within normal limits  TSH - Abnormal; Notable for the following  components:   TSH 4.540 (*)    All other components within normal limits  I-STAT CHEM 8, ED - Abnormal; Notable for the following components:   Sodium 133 (*)    Chloride 80 (*)    Creatinine, Ser 0.50 (*)    Glucose, Bld 108 (*)    Calcium, Ion 1.12 (*)    TCO2 44 (*)    Hemoglobin 11.2 (*)    HCT 33.0 (*)    All other components within normal limits  I-STAT TROPONIN, ED    EKG  EKG Interpretation  Date/Time:  Friday November 26 2017 12:00:42 EST Ventricular Rate:  167 PR Interval:    QRS Duration: 125 QT Interval:  298 QTC Calculation: 497 R Axis:   55 Text Interpretation:  Narrow QRS tachycardia Nonspecific intraventricular conduction delay st changes likely rate related Otherwise no significant change Confirmed by Deno Etienne [phone]) on 12/13/2017 12:23:32 PM       Radiology Dg Chest Port 1 View  Result Date: 11/24/2017 CLINICAL DATA:  Shortness of breath EXAM: PORTABLE CHEST 1 VIEW COMPARISON:  11/13/2017 FINDINGS: The cardio pericardial silhouette is enlarged. Asymmetric elevation left hemidiaphragm. Large left hilar and mediastinal lung mass again noted. Pleural effusion again noted on the left. Right lung relatively clear. The visualized bony structures of the thorax are intact. Telemetry leads overlie the chest. IMPRESSION: No substantial change. Asymmetric elevation left hemidiaphragm with large left hilar/mediastinal mass and left base collapse/consolidation with pleural effusion. Electronically Signed   By: Misty Stanley M.D.   On: 11/19/2017 12:19    Procedures Procedures (including critical care time)  Medications Ordered in ED Medications  diltiazem (CARDIZEM) 1 mg/mL load via infusion 5 mg (5 mg Intravenous Bolus from Bag 12/01/2017 1326)    And  diltiazem (CARDIZEM) 100 mg in dextrose 5 % 100 mL (1 mg/mL) infusion (10 mg/hr Intravenous Rate/Dose Change 12/08/2017 1503)  sodium chloride 0.9 % bolus 1,000 mL (0 mLs Intravenous Stopped 11/27/2017 1405)      Initial Impression / Assessment and Plan / ED Course  I have reviewed the triage  vital signs and the nursing notes.  Pertinent labs & imaging results that were available during my care of the patient were reviewed by me and considered in my medical decision making (see chart for details).     61 yo M with a chief complaint of shortness of breath.  Patient was found to be in atrial flutter.  Started on diltiazem drip.  Due to a soft blood pressure this was started at a low rate and increase slowly.  I discussed the case with Dr. Percival Spanish, cardiology felt that the patient was indeed in atrial flutter.  Will discuss with hospitalist for admission.  CRITICAL CARE Performed by: Cecilio Asper   Total critical care time: 35 minutes  Critical care time was exclusive of separately billable procedures and treating other patients.  Critical care was necessary to treat or prevent imminent or life-threatening deterioration.  Critical care was time spent personally by me on the following activities: development of treatment plan with patient and/or surrogate as well as nursing, discussions with consultants, evaluation of patient's response to treatment, examination of patient, obtaining history from patient or surrogate, ordering and performing treatments and interventions, ordering and review of laboratory studies, ordering and review of radiographic studies, pulse oximetry and re-evaluation of patient's condition.  The patients results and plan were reviewed and discussed.   Any x-rays performed were independently reviewed by myself.   Differential diagnosis were considered with the presenting HPI.  Medications  diltiazem (CARDIZEM) 1 mg/mL load via infusion 5 mg (5 mg Intravenous Bolus from Bag 11/14/2017 1326)    And  diltiazem (CARDIZEM) 100 mg in dextrose 5 % 100 mL (1 mg/mL) infusion (10 mg/hr Intravenous Rate/Dose Change 12/12/2017 1503)  sodium chloride 0.9 % bolus 1,000 mL (0 mLs  Intravenous Stopped 11/19/2017 1405)    Vitals:   12/12/2017 1415 11/22/2017 1430 12/08/2017 1445 12/06/2017 1500  BP: 92/76 (!) 85/71 (!) 76/65 91/67  Pulse: (!) 166 (!) 167 (!) 168 (!) 165  Resp: 18 19 19 19   SpO2: 100% 100% 100% 100%    Final diagnoses:  Atrial fibrillation with RVR (HCC)    Admission/ observation were discussed with the admitting physician, patient and/or family and they are comfortable with the plan.   Final Clinical Impressions(s) / ED Diagnoses   Final diagnoses:  Atrial fibrillation with RVR [facility])    ED Discharge Orders    None       Deno Etienne, DO 12/05/2017 1520

## 2017-11-26 NOTE — Progress Notes (Signed)
Overnight Cardiology Fellow  Levi Brown is a very nice gentleman with advanced cancer, who presented with AFl w/ RVR. AFl/Afib more than 48hrs and no on AC.   Called to bedside for worsening SOB and diaphoresis. Sitting upright, tachypneic to 30s and mildly diaphoretic. Able to speak in complete sentences and SBP 110s.   EKG similar to prior with AFL. Amiodarone just started.   - Will give lasix IV and hopefully allow for the amiodarone to take effect. May need bipap if no improvement in rate/breathing.  - Will request transfer to higher level ICU/step-down care (for nursing:patient ratio) - discussed with patient possibility of urgent DCCV if his respiratory status worsens/AFl becomes unstable  Levi Silvers, MD

## 2017-11-26 NOTE — Progress Notes (Signed)
Patient having sob, sweating HR in the 160-170s, ekg done, showing STEMI.  Cardiology paged, RR paged and assess the patient. Amio was given at 500 bolus and it is running at 33.3, diltiazem d/c per MD orders, Hep running at 10.  Patient still feeling sob w/o pain.  Only given 500 ml of NS this evening, order was for 1L, the other 500 ml was not given, cardiology stated to stop. Per cardiology patient will be moved to [facility] for continue monitoring.

## 2017-11-26 NOTE — Progress Notes (Signed)
ANTICOAGULATION CONSULT NOTE - Initial Consult  Pharmacy Consult for heparin Indication: atrial fibrillation  No Known Allergies  Patient Measurements: TBW 72.6 kg Heparin Dosing Weight: 72.6 kg  Assessment: 61 yo M presents on 12/14 with Aflutter. Not on anticoag PTA. Hgb 11.2, plts 483. Pharmacy consulted to start heparin.  Goal of Therapy:  Heparin level 0.3-0.7 units/ml Monitor platelets by anticoagulation protocol: Yes   Plan:  Give heparin 4,000 unit bolus Start heparin gtt at 1,000 units/hr Monitor daily heparin level, CBC, s/s of bleed  Elenor Quinones, PharmD, [facility] Clinical Pharmacist Pager [phone] 11/13/2017 4:47 PM

## 2017-11-26 NOTE — Significant Event (Signed)
Rapid Response Event Note  Overview: Time Called: 2140 Arrival Time: 2142 Event Type: Cardiac, Respiratory  Initial Focused Assessment: Dyspnea and rapid heart rate   Interventions: EKG   Plan of Care (if not transferred):  Event Summary: Called to assist with care of patient having shortness of breath and rapid heart rate. Patient is alert and responsive. Dyspneic with mild diaphoresis. No reports of chest pain, dizziness, or nausea. EKG was performed by unit staff. Patient is on IV Amiodarone for rate control. HR is 160-170. Cardiology was called to bedside. Due to level of distress it was recommended to place patient in a higher level of care. No RRT interventions at present.   Levi Brown

## 2017-11-27 ENCOUNTER — Inpatient Hospital Stay (HOSPITAL_COMMUNITY): Payer: Medicaid Other

## 2017-11-27 ENCOUNTER — Encounter (HOSPITAL_COMMUNITY): Payer: Self-pay | Admitting: Radiology

## 2017-11-27 ENCOUNTER — Other Ambulatory Visit: Payer: Self-pay

## 2017-11-27 DIAGNOSIS — J449 Chronic obstructive pulmonary disease, unspecified: Secondary | ICD-10-CM

## 2017-11-27 DIAGNOSIS — J9602 Acute respiratory failure with hypercapnia: Secondary | ICD-10-CM

## 2017-11-27 DIAGNOSIS — I483 Typical atrial flutter: Secondary | ICD-10-CM

## 2017-11-27 LAB — RETICULOCYTES
RBC.: 3.6 MIL/uL — ABNORMAL LOW (ref 4.22–5.81)
RETIC COUNT ABSOLUTE: 57.6 10*3/uL (ref 19.0–186.0)
Retic Ct Pct: 1.6 % (ref 0.4–3.1)

## 2017-11-27 LAB — BLOOD GAS, ARTERIAL
Acid-Base Excess: 13 mmol/L — ABNORMAL HIGH (ref 0.0–2.0)
Bicarbonate: 42 mmol/L — ABNORMAL HIGH (ref 20.0–28.0)
Drawn by: 31394
FIO2: 100
O2 Saturation: 99.4 %
PATIENT TEMPERATURE: 98.7
PH ART: 7.148 — AB (ref 7.350–7.450)
PO2 ART: 291 mmHg — AB (ref 83.0–108.0)

## 2017-11-27 LAB — CBC
HEMATOCRIT: 30.2 % — AB (ref 39.0–52.0)
Hemoglobin: 9.1 g/dL — ABNORMAL LOW (ref 13.0–17.0)
MCH: 25.1 pg — AB (ref 26.0–34.0)
MCHC: 30.1 g/dL (ref 30.0–36.0)
MCV: 83.4 fL (ref 78.0–100.0)
Platelets: 273 10*3/uL (ref 150–400)
RBC: 3.62 MIL/uL — AB (ref 4.22–5.81)
RDW: 17.6 % — AB (ref 11.5–15.5)
WBC: 11.3 10*3/uL — AB (ref 4.0–10.5)

## 2017-11-27 LAB — POCT I-STAT 3, ART BLOOD GAS (G3+)
Acid-Base Excess: 12 mmol/L — ABNORMAL HIGH (ref 0.0–2.0)
Acid-Base Excess: 13 mmol/L — ABNORMAL HIGH (ref 0.0–2.0)
BICARBONATE: 41.9 mmol/L — AB (ref 20.0–28.0)
Bicarbonate: 39.5 mmol/L — ABNORMAL HIGH (ref 20.0–28.0)
O2 SAT: 100 %
O2 Saturation: 99 %
PCO2 ART: 88.1 mmHg — AB (ref 32.0–48.0)
PCO2 ART: 55 mmHg — AB (ref 32.0–48.0)
PH ART: 7.462 — AB (ref 7.350–7.450)
Patient temperature: 97.5
TCO2: 45 mmol/L — AB (ref 22–32)
TCO2: 41 mmol/L — ABNORMAL HIGH (ref 22–32)
pH, Arterial: 7.285 — ABNORMAL LOW (ref 7.350–7.450)
pO2, Arterial: 405 mmHg — ABNORMAL HIGH (ref 83.0–108.0)
pO2, Arterial: 140 mmHg — ABNORMAL HIGH (ref 83.0–108.0)

## 2017-11-27 LAB — RAPID URINE DRUG SCREEN, HOSP PERFORMED
AMPHETAMINES: NOT DETECTED
BENZODIAZEPINES: NOT DETECTED
Barbiturates: NOT DETECTED
COCAINE: NOT DETECTED
OPIATES: NOT DETECTED
Tetrahydrocannabinol: NOT DETECTED

## 2017-11-27 LAB — IRON AND TIBC
IRON: 66 ug/dL (ref 45–182)
Saturation Ratios: 27 % (ref 17.9–39.5)
TIBC: 246 ug/dL — ABNORMAL LOW (ref 250–450)
UIBC: 180 ug/dL

## 2017-11-27 LAB — BASIC METABOLIC PANEL
Anion gap: 12 (ref 5–15)
BUN: 5 mg/dL — ABNORMAL LOW (ref 6–20)
CHLORIDE: 84 mmol/L — AB (ref 101–111)
CO2: 35 mmol/L — AB (ref 22–32)
Calcium: 8.8 mg/dL — ABNORMAL LOW (ref 8.9–10.3)
Creatinine, Ser: 0.47 mg/dL — ABNORMAL LOW (ref 0.61–1.24)
GFR calc non Af Amer: >60 mL/min (ref >60)
Glucose, Bld: 139 mg/dL — ABNORMAL HIGH (ref 65–99)
POTASSIUM: 4 mmol/L (ref 3.5–5.1)
SODIUM: 131 mmol/L — AB (ref 135–145)

## 2017-11-27 LAB — VITAMIN B12: VITAMIN B 12: 1149 pg/mL — AB (ref 180–914)

## 2017-11-27 LAB — HEPARIN LEVEL (UNFRACTIONATED): Heparin Unfractionated: <0.1 IU/mL — ABNORMAL LOW (ref 0.30–0.70)

## 2017-11-27 LAB — TROPONIN I
Troponin I: <0.03 ng/mL (ref <0.03)
Troponin I: <0.03 ng/mL (ref <0.03)

## 2017-11-27 LAB — MRSA PCR SCREENING: MRSA BY PCR: NEGATIVE

## 2017-11-27 LAB — GLUCOSE, CAPILLARY: Glucose-Capillary: 173 mg/dL — ABNORMAL HIGH (ref 65–99)

## 2017-11-27 LAB — T4, FREE: FREE T4: 0.74 ng/dL (ref 0.61–1.12)

## 2017-11-27 LAB — FERRITIN: FERRITIN: 1376 ng/mL — AB (ref 24–336)

## 2017-11-27 LAB — FOLATE: FOLATE: 15.4 ng/mL (ref >5.9)

## 2017-11-27 MED ORDER — MIDAZOLAM HCL 2 MG/2ML IJ SOLN
1.0000 mg | INTRAMUSCULAR | Status: DC | PRN
  Administered 2017-11-27: 2 mg via INTRAVENOUS
  Filled 2017-11-27: qty 2

## 2017-11-27 MED ORDER — AMIODARONE HCL 200 MG PO TABS
400.0000 mg | ORAL_TABLET | Freq: Two times a day (BID) | ORAL | Status: DC
  Administered 2017-11-27 (×2): 400 mg via ORAL
  Filled 2017-11-27 (×2): qty 2

## 2017-11-27 MED ORDER — VANCOMYCIN HCL 10 G IV SOLR
1500.0000 mg | Freq: Once | INTRAVENOUS | Status: AC
  Administered 2017-11-27: 1500 mg via INTRAVENOUS
  Filled 2017-11-27: qty 1500

## 2017-11-27 MED ORDER — PIPERACILLIN-TAZOBACTAM 3.375 G IVPB
3.3750 g | Freq: Three times a day (TID) | INTRAVENOUS | Status: DC
  Administered 2017-11-28 – 2017-12-03 (×17): 3.375 g via INTRAVENOUS
  Filled 2017-11-27 (×17): qty 50

## 2017-11-27 MED ORDER — INFLUENZA VAC SPLIT QUAD 0.5 ML IM SUSY: 0.5000 mL | PREFILLED_SYRINGE | INTRAMUSCULAR | Status: DC

## 2017-11-27 MED ORDER — FENTANYL CITRATE (PF) 100 MCG/2ML IJ SOLN
75.0000 ug | Freq: Once | INTRAMUSCULAR | Status: AC
  Administered 2017-11-27: 75 ug via INTRAVENOUS

## 2017-11-27 MED ORDER — IPRATROPIUM-ALBUTEROL 0.5-2.5 (3) MG/3ML IN SOLN
3.0000 mL | RESPIRATORY_TRACT | Status: DC
  Administered 2017-11-27 – 2017-12-03 (×35): 3 mL via RESPIRATORY_TRACT
  Filled 2017-11-27 (×35): qty 3

## 2017-11-27 MED ORDER — PIPERACILLIN-TAZOBACTAM 3.375 G IVPB 30 MIN
3.3750 g | Freq: Once | INTRAVENOUS | Status: AC
  Administered 2017-11-27: 3.375 g via INTRAVENOUS
  Filled 2017-11-27: qty 50

## 2017-11-27 MED ORDER — PROPOFOL 1000 MG/100ML IV EMUL
INTRAVENOUS | Status: AC
  Filled 2017-11-27: qty 100

## 2017-11-27 MED ORDER — APIXABAN 5 MG PO TABS
5.0000 mg | ORAL_TABLET | Freq: Two times a day (BID) | ORAL | Status: DC
  Administered 2017-11-27 (×2): 5 mg via ORAL
  Filled 2017-11-27 (×2): qty 1

## 2017-11-27 MED ORDER — ALBUTEROL SULFATE (2.5 MG/3ML) 0.083% IN NEBU
2.5000 mg | INHALATION_SOLUTION | RESPIRATORY_TRACT | Status: DC | PRN
  Administered 2017-11-30 (×2): 2.5 mg via RESPIRATORY_TRACT
  Filled 2017-11-27 (×2): qty 3

## 2017-11-27 MED ORDER — ORAL CARE MOUTH RINSE
15.0000 mL | Freq: Four times a day (QID) | OROMUCOSAL | Status: DC
  Administered 2017-11-28 – 2017-12-01 (×11): 15 mL via OROMUCOSAL

## 2017-11-27 MED ORDER — MIDAZOLAM HCL 2 MG/2ML IJ SOLN
INTRAMUSCULAR | Status: AC
  Filled 2017-11-27: qty 4

## 2017-11-27 MED ORDER — IOPAMIDOL (ISOVUE-370) INJECTION 76%
INTRAVENOUS | Status: AC
  Administered 2017-11-27: 60 mL
  Filled 2017-11-27: qty 100

## 2017-11-27 MED ORDER — HEPARIN BOLUS VIA INFUSION
3000.0000 [IU] | Freq: Once | INTRAVENOUS | Status: AC
  Administered 2017-11-27: 3000 [IU] via INTRAVENOUS
  Filled 2017-11-27: qty 3000

## 2017-11-27 MED ORDER — VANCOMYCIN HCL IN DEXTROSE 1-5 GM/200ML-% IV SOLN
1000.0000 mg | Freq: Two times a day (BID) | INTRAVENOUS | Status: DC
  Administered 2017-11-28 – 2017-11-29 (×3): 1000 mg via INTRAVENOUS
  Filled 2017-11-27 (×6): qty 200

## 2017-11-27 MED ORDER — PROPOFOL 1000 MG/100ML IV EMUL
5.0000 ug/kg/min | INTRAVENOUS | Status: DC
  Administered 2017-11-27: 10 ug/kg/min via INTRAVENOUS
  Administered 2017-11-28 – 2017-11-29 (×4): 30 ug/kg/min via INTRAVENOUS
  Filled 2017-11-27 (×5): qty 100

## 2017-11-27 MED ORDER — CHLORHEXIDINE GLUCONATE 0.12% ORAL RINSE (MEDLINE KIT)
15.0000 mL | Freq: Two times a day (BID) | OROMUCOSAL | Status: DC
  Administered 2017-11-27 – 2017-11-29 (×4): 15 mL via OROMUCOSAL

## 2017-11-27 MED ORDER — FENTANYL CITRATE (PF) 100 MCG/2ML IJ SOLN
INTRAMUSCULAR | Status: AC
  Filled 2017-11-27: qty 4

## 2017-11-27 MED ORDER — ETOMIDATE 2 MG/ML IV SOLN
20.0000 mg | Freq: Once | INTRAVENOUS | Status: AC
  Administered 2017-11-27: 20 mg via INTRAVENOUS

## 2017-11-27 NOTE — Progress Notes (Signed)
Patient ID: Levi Brown, male   DOB: 04-06-1956, 61 y.o.   MRN: [id number]   Progress Note  Patient Name: Levi Brown Date of Encounter: 11/27/2017  Primary Cardiologist: No primary care provider on file.   Subjective   Patient went back into NSR overnight, now sinus tachy in 100s.  Sitting in chair, denies dyspnea.   Inpatient Medications    Scheduled Meds: . aspirin EC  81 mg Oral Daily  . feeding supplement  1 Container Oral TID BM  . ferrous sulfate  325 mg Oral BID WC  . senna-docusate  1-2 tablet Oral QHS  . sucralfate  1 g Oral TID WC   Continuous Infusions: . sodium chloride    . amiodarone 30 mg/hr (11/27/17 0600)  . heparin 1,300 Units/hr (11/27/17 0600)   PRN Meds: albuterol, ipratropium-albuterol, ondansetron, prochlorperazine, traZODone   Vital Signs    Vitals:   11/27/17 0530 11/27/17 0600 11/27/17 0630 11/27/17 0700  BP:  (!) 121/94 (!) 109/91 121/89  Pulse: (!) 112 (!) 106 (!) 105 (!) 107  Resp: (!) 21 19 17 17   Temp:      TempSrc:      SpO2: 100% 100% 100% 100%  Weight:      Height:        Intake/Output Summary (Last 24 hours) at 11/27/2017 0736 Last data filed at 11/27/2017 0600 Gross per 24 hour  Intake 2333 ml  Output 600 ml  Net 1733 ml   Filed Weights   11/13/2017 1753 11/20/2017 2300 11/27/17 0500  Weight: 160 lb 0.9 oz (72.6 kg) 154 lb 15.7 oz (70.3 kg) 158 lb 1.1 oz (71.7 kg)    Telemetry    Sinus tachy 100s - Personally Reviewed   Physical Exam   GEN: No acute distress.   Neck: No JVD Cardiac: Mildly tachy, RRR, no murmurs, rubs, or gallops.  Respiratory: Decreased breath sounds left base. GI: Soft, nontender, non-distended  MS: No edema; No deformity. Neuro:  Nonfocal  Psych: Normal affect   Labs    Chemistry Recent Labs  Lab 11/25/2017 1205 11/20/2017 1225 11/23/2017 1837  NA 133* 133*  --   K 3.8 3.8  --   CL 83* 80*  --   CO2 42*  --   --   GLUCOSE 106* 108*  --   BUN 5* 6  --   CREATININE 0.43* 0.50*  --    CALCIUM 9.1  --   --   ALBUMIN  --   --  2.6*  GFRNONAA >60  --   --   GFRAA >60  --   --   ANIONGAP 8  --   --      Hematology Recent Labs  Lab 12/06/2017 1205 11/16/2017 1225 11/27/17 0623  WBC 11.3*  --  11.3*  RBC 3.64*  --  3.62*  HGB 9.1* 11.2* 9.1*  HCT 30.1* 33.0* 30.2*  MCV 82.7  --  83.4  MCH 25.0*  --  25.1*  MCHC 30.2  --  30.1  RDW 17.9*  --  17.6*  PLT 483*  --  273    Cardiac Enzymes Recent Labs  Lab 12/05/2017 1837 11/27/17 0004  TROPONINI <0.03 <0.03    Recent Labs  Lab 11/19/2017 1223  TROPIPOC 0.00     BNPNo results for input(s): BNP, PROBNP in the last 168 hours.   DDimer No results for input(s): DDIMER in the last 168 hours.   Radiology    Dg Chest [facility]  1 View  Result Date: 11/27/2017 CLINICAL DATA:  Shortness of breath EXAM: PORTABLE CHEST 1 VIEW COMPARISON:  11/13/2017 FINDINGS: The cardio pericardial silhouette is enlarged. Asymmetric elevation left hemidiaphragm. Large left hilar and mediastinal lung mass again noted. Pleural effusion again noted on the left. Right lung relatively clear. The visualized bony structures of the thorax are intact. Telemetry leads overlie the chest. IMPRESSION: No substantial change. Asymmetric elevation left hemidiaphragm with large left hilar/mediastinal mass and left base collapse/consolidation with pleural effusion. Electronically Signed   By: Misty Stanley M.D.   On: 11/29/2017 12:19     Patient Profile     Levi Brown is a 61 y.o. 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  Assessment & Plan    1. Atrial flutter - Likely triggered by metastatic  lung CA.  He is back in NSR this morning on amiodarone gtt and heparin gtt.  - Transition to po amiodarone 400 mg bid today.  - Transition to Eliquis 5 mg po bid.  Given cancer diagnosis, would recommend keeping him on anticoagulation as long as bleeding risk is not excessive. Needs to continue at least 1 month with acute conversion to NSR.   2. Metastatic lung cancer with recurrent left pleural effusion - No plan at this point for repeat thoracentesis.   3. Former tobacco abuse - has remained abstinent for 4 years.  4. Current alcohol use - primary team is monitoring for withdrawal with [facility]WA.   5. Abnormal TSH - fT4 pending.  6. Downtrending hemoglobin since CA dx - possibly related to chronic disease, will need to monitor while on anticoagulation.  From my standpoint, he can leave CCU.   For questions or updates, please contact [place] Please consult [email or web address] for contact info under Cardiology/STEMI.      Signed, Loralie Champagne, MD  11/27/2017, 7:36 AM

## 2017-11-27 NOTE — H&P (Signed)
PULMONARY / CRITICAL CARE MEDICINE   Name: Levi Brown MRN: [id number] DOB: 01-03-1956    ADMISSION DATE:  12/06/2017 CONSULTATION DATE:  11/27/2017  REFERRING MD:  Claudine Mouton  CHIEF COMPLAINT:  Hypercapneic respiratory failure  HISTORY OF PRESENT ILLNESS:   This is a 61 y.o. B male admitted 11/16/2017 for SOB secondary to acute atrial flutter. The patient was dx'd with NSCLC via FOB in July of the is year involving the LUL and mediastinum, with occlusion of the LUL bronchus and a left pleural effusion. Left thoracentesis performed 08/26/2017 showed: There are few atypical cells present with vaculated nuleus and cytoplasm, It is difficult to determin the atypia is post radiation changes versus neoplastic process. The patient was seen by Cardiology for his atrial flutter, initially treated with diltiazem but ultimately required amiodarone for conversion to sinus rhythm. Although he is labeled as having COPD it is uncertain if this diagnosis was based on PFTs. On the afternoon of 12/15 the patient became more SOB treated with nebs. A CTA was obtained to r/o PE, but compared the the CT in July showed possible lingular pneumonia vs atelectasis. PCCM was called for worsening respiratory distress and an ABG showed an unreportably high PCO2 with acidemia of pH 7.148. The patient was therefore emergently intubated.  PAST MEDICAL HISTORY :  He  has a past medical history of Former smoker, Non-small cell cancer of left lung ([place]) (07/13/2017), and Stroke ([place]) (07/07/2017).  PAST SURGICAL HISTORY: He  has a past surgical history that includes Video bronchoscopy (Bilateral, 07/07/2017).  No Known Allergies  No current facility-administered medications on file prior to encounter.    Current Outpatient Medications on File Prior to Encounter  Medication Sig  . albuterol (PROVENTIL HFA;VENTOLIN HFA) 108 (90 Base) MCG/ACT inhaler Inhale 2 puffs into the lungs every 4 (four) hours as needed for wheezing  or shortness of breath (cough, shortness of breath or wheezing.).  Marland Kitchen aspirin EC 81 MG tablet Take 1 tablet (81 mg total) by mouth daily.  . feeding supplement (BOOST / RESOURCE BREEZE) LIQD Take 1 Container by mouth 3 (three) times daily between meals.  . ferrous sulfate (FERROUSUL) 325 (65 FE) MG tablet Take 1 tablet (325 mg total) by mouth 2 (two) times daily with a meal.  . furosemide (LASIX) 20 MG tablet Take 1 tablet (20 mg total) by mouth daily as needed.  Marland Kitchen ipratropium-albuterol (DUONEB) 0.5-2.5 (3) MG/3ML SOLN Take 3 mLs by nebulization every 4 (four) hours as needed.  . metoprolol tartrate (LOPRESSOR) 25 MG tablet Take 1 tablet (25 mg total) by mouth daily.  . Multiple Vitamins-Minerals (DRY EYE FORMULA PO) Place 1 drop into both eyes daily.  . ondansetron (ZOFRAN ODT) 4 MG disintegrating tablet Take 1 tablet (4 mg total) by mouth every 8 (eight) hours as needed for nausea or vomiting.  . potassium chloride SA (K-DUR,KLOR-CON) 20 MEQ tablet Take 1 tablet (20 mEq total) by mouth daily.  . predniSONE (DELTASONE) 20 MG tablet Take 20 mg by mouth daily with breakfast.  . prochlorperazine (COMPAZINE) 10 MG tablet Take 1 tablet (10 mg total) by mouth every 6 (six) hours as needed for nausea or vomiting.  . promethazine-codeine (PHENERGAN WITH CODEINE) 6.25-10 MG/5ML syrup Take 5 mLs by mouth every 6 (six) hours as needed for cough.  . senna-docusate (SENOKOT-S) 8.6-50 MG tablet Take 1-2 tablets by mouth at bedtime. (Patient taking differently: Take 1-2 tablets by mouth at bedtime as needed for mild constipation. )  . sucralfate (  CARAFATE) 1 GM/10ML suspension Take 10 mLs (1 g total) by mouth 3 (three) times daily with meals. (Patient taking differently: Take 1 g by mouth 3 (three) times daily as needed (ulcers). )  . traZODone (DESYREL) 50 MG tablet Take 0.5-1 tablets (25-50 mg total) by mouth at bedtime as needed for sleep.  Marland Kitchen azithromycin (ZITHROMAX) 250 MG tablet Take 2 tabs PO x 1 dose, then 1  tab PO QD x 4 days (Patient taking differently: Take 250 mg by mouth See admin instructions. Take 2 tabs PO x 1 dose, then 1 tab PO QD x 4 days)    FAMILY HISTORY:  His indicated that the status of his mother is unknown.   SOCIAL HISTORY: He  reports that he quit smoking about 1 years ago. His smoking use included cigarettes. He has a 30.00 pack-year smoking history. he has never used smokeless tobacco. He reports that he drinks about 8.4 - 12.6 oz of alcohol per week. He reports that he does not use drugs.  REVIEW OF SYSTEMS:   See HPI as patient unable to provide  SUBJECTIVE:  Intubated  VITAL SIGNS: BP (!) 201/122   Pulse (!) 109   Temp 98.7 F (37.1 C) (Oral)   Resp 20   Ht 5\' 6"  (1.676 m)   Wt 71.7 kg (158 lb 1.1 oz)   SpO2 94%   BMI 25.51 kg/m   HEMODYNAMICS:    VENTILATOR SETTINGS: Vent Mode: PRVC FiO2 (%):  [100 %] 100 % Set Rate:  [20 bmp] 20 bmp Vt Set:  [400 mL] 400 mL PEEP:  [5 cmH20] 5 cmH20 Plateau Pressure:  [22 cmH20] 22 cmH20  INTAKE / OUTPUT: I/O last 3 completed shifts: In: 2349.7 [P.O.:990; I.V.:359.7; IV Piggyback:1000] Out: 600 [Urine:600]  PHYSICAL EXAMINATION: General:  WD/WN B M with agonal respirations upon my initial presentation prior to intubation Neuro:  Moving all 4s before sedation HEENT:  [place]/AT PRRL O-P: unremarkable Cardiovascular:  Tachy but regular. No murmur Lungs:  Decreased BS over L chest Abdomen:  Slightly protuberant but supple. No guarding, masses or organomegaly Musculoskeletal:  No obvious joint deformities Skin:  No rashes  LABS:  BMET Recent Labs  Lab 11/28/2017 1205 11/30/2017 1225 11/27/17 0623  NA 133* 133* 131*  K 3.8 3.8 4.0  CL 83* 80* 84*  CO2 42*  --  35*  BUN 5* 6 5*  CREATININE 0.43* 0.50* 0.47*  GLUCOSE 106* 108* 139*    Electrolytes Recent Labs  Lab 11/13/2017 1205 11/27/17 0623  CALCIUM 9.1 8.8*    CBC Recent Labs  Lab 12/06/2017 1205 11/13/2017 1225 11/27/17 0623  WBC 11.3*  --   11.3*  HGB 9.1* 11.2* 9.1*  HCT 30.1* 33.0* 30.2*  PLT 483*  --  273    Coag's No results for input(s): APTT, INR in the last 168 hours.  Sepsis Markers No results for input(s): LATICACIDVEN, PROCALCITON, O2SATVEN in the last 168 hours.  ABG Recent Labs  Lab 11/27/17 1653  PHART 7.148*  PCO2ART ABOVE REPORTABLE RANGE,RBV C.[place] BY A.MCKINNEY AT 6578 ON 11/27/2017  PO2ART 291.00*    Liver Enzymes Recent Labs  Lab 12/13/2017 1837  ALBUMIN 2.6*    Cardiac Enzymes Recent Labs  Lab 11/16/2017 1837 11/27/17 0004 11/27/17 0623  TROPONINI <0.03 <0.03 <0.03    Glucose No results for input(s): GLUCAP in the last 168 hours.  Imaging Ct Angio Chest Pe W Or Wo Contrast  Result Date: 11/27/2017 CLINICAL DATA:  Shortness of breath, tachycardia  EXAM: CT ANGIOGRAPHY CHEST WITH CONTRAST TECHNIQUE: Multidetector CT imaging of the chest was performed using the standard protocol during bolus administration of intravenous contrast. Multiplanar CT image reconstructions and MIPs were obtained to evaluate the vascular anatomy. CONTRAST:  17mL ISOVUE-370 IOPAMIDOL (ISOVUE-370) INJECTION 76% COMPARISON:  11/13/2017 FINDINGS: Cardiovascular: No filling defects in the pulmonary artery is to suggest pulmonary emboli. Right side aortic arch noted. There is a retroesophageal left subclavian artery. Cardiomegaly, small pericardial effusion. 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 Left pulmonary artery is markedly narrowed by the left upper lobe mass, stable. Mediastinum/Nodes: No visible measurable mediastinal, right hilar or axillary adenopathy. Lungs/Pleura: Large anterior left upper lung and mediastinal mass is again noted measuring 9.0 x 6.6 cm compared to 9.0 x 6.2 cm previously, likely not significantly changed. Large left pleural effusion now noted, increased since prior study compressive atelectasis or pneumonia noted in the lingula and left lower lobe with  only a small amount of aerated left upper lobe noted. Underlying emphysema. Small right pleural effusion is new since prior study. Upper Abdomen: Imaging into the upper abdomen shows no acute findings. Musculoskeletal: Chest wall soft tissues are unremarkable. No acute bony abnormality. Review of the MIP images confirms the above findings. IMPRESSION: Large left upper lobe mass again noted, invading the mediastinum, unchanged since prior study. There is enlarging large left effusion and new small right effusion. Only a small amount of aerated left upper lobe now present. Airspace disease within the lingula and left lower lobe could reflect atelectasis or pneumonia. Cardiomegaly, small pericardial effusion. Severe attenuation/narrowing of the left upper lobe pulmonary artery by the left upper lobe mass. No evidence of pulmonary embolus. 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 Recommend attention on follow-up imaging. Electronically Signed   By: Rolm Baptise M.D.   On: 11/27/2017 16:10     STUDIES:  CTA as above. Personally reviewed  CULTURES: Blood, tracheal aspirate, urine ordered  ANTIBIOTICS: None - to be ordered  SIGNIFICANT EVENTS: Acute hypercapneic respiratory failure  LINES/TUBES: ETT at 25.5 cm; ~2 cm above carina on post intubation PCXR  DISCUSSION: Acute hypercapneic respiratory failure. Consider post-obstructive pneumonia  ASSESSMENT / PLAN:  PULMONARY A: Hypercapneic respiratory failure P:   Adjust vent to correct acidemia. Need to be cognizant of potential to overdistend the R lung considering L lung disease Plateau pressure is <30.  CARDIOVASCULAR A:  Converted atrial flutter on amiodarone P:  Per Cardiol  RENAL A:   Normal renal indices P:   Follow  GASTROINTESTINAL A:   No acute problems P:   Follow  HEMATOLOGIC A:   Normocytic, normochromic anemia but with increased RDW likely related to in part to his  underlying malignancy P:  No need for acute intervention. Follow  INFECTIOUS A:   Possible post-obstructive HCAP P:   Empiric Zosyn + Vanc until culture results available. Consider diagnostic thoracentesis to r/o infected L pleural space when able to get consent from sister.  ENDOCRINE A:   Borderline elevated TSH with nl free T4 P:   Primary service had planned to repeat in 6 weeks as OPD. No intervention warranted at present  NEUROLOGIC A:   Sedated post intubation P:   RASS goal: -1    FAMILY  - Updates:   Total Critical care time: 1 hour    Pulmonary and [place] Pager: [phone]  11/27/2017, 6:00 PM

## 2017-11-27 NOTE — Progress Notes (Signed)
Noticed changes on bedside monitor. EKG obtained. CCM, cardiology notified.

## 2017-11-27 NOTE — Progress Notes (Signed)
Family Medicine Teaching Service Daily Progress Note Intern Pager: [phone]  Patient name: Levi Brown Medical record number: [id number] Date of birth: [birth year] Age: 61 y.o. Gender: male  Primary Care Provider: [name], FNP Consultants: cardiology Code Status: full code  Pt Overview and Major Events to Date:  12/15: Admit for dyspnea secondary to new a flutter  Assessment and Plan:  Levi Brown is a 61 y.o. male presenting with shortness of breath . PMH is significant for non-small cell lung cancer diagnosed in July 2018, HTN, hx tobacco abuse, hx alcohol abuse, COPD  New dx of Aflutter: Presented as dyspnea. Likely this is caused by his advancing pulmonary disease (COPD) made worse by his recent diagnosis of [place]. S/p diltiazem drip which which he was resistant to and developed hypotension with. He was switched to amiodarone gtt and is now in NSR this morning on 3 L [place] (on 2L O2 at baseline). Trop neg x 3.   -vitals per floor protocol -monitor on tele and continuous pulse ox  -continue O2 supplementation (On home 2L) -cardiology following, appreciate assistance  - Per cardiology, transition to PO Amiodarone 400 mg BID - Transition off Heparin and to Eliquis 5 mg BID, continue especially due to cancer -Daily CBC, BMP  COPD with acute on chronic respiratory failure: On 2L O2 at baseline, on 3 L this morning. Increased O2 requirement and dyspnea likely secondary to new a flutter -duonebs q4 PRN -cont to monitor pulse ox  NSCLC: Patient reports being dianosed on July 23rd, 2018. He is following with Dr. Inda Merlin with oncology. - currently undergoing radiation treatment, no chemotherapy yet. - will FYI oncologist about admission   Hx alcohol abuse/marijuana use: patient denies current alcohol use, endorses marijuana use. CIWA score of 1 this morning -monitor on CIWA -UDS  Severe Protein Calorie Malnutrition: Patient has [place] and has been requiring boost  supplementation. His appetite has been normal but he has been unable to eat as much due to early satiety. Probably due to radiation therapy for [place]. Serum albumin 2.6.  - Nutrition consulted, appreciate recommendations - cont boost supplements that he has been eating at home  Abnormal TSH: TSH slightly high at 4.54. Less likely this is cause of new a flutter but it is a possibility. Free T4 normal. High TSH likely subclinical hypothyroidism - Will need repeat TSH and free T4 in outpatient setting in ~6 weeks  Normocytic Anemia: Likely anemia of chronic disease. Hgb 9.1 this morning, has been downtrending since July 2018 when he was diagnosed with lung cancer. Patient's girlfriend notes he was told he had iron deficiency in the past/ - Continue to monitor - Check anemia panel - FOBT  Electrolyte abnormalities: Hypochloremia to 84 and hyponatremia to 131.   - Daily BMP  FEN/GI: regular diet Prophylaxis: lovenox  Disposition: Transfer to telemetry, continue to monitor   Subjective:  Patient states he is doing much better this morning. Endorses dyspnea has greatly improved. Notes that he doesn't have an appetite for breakfast.   Objective: Temp:  [97.8 F (36.6 C)-98.7 F (37.1 C)] 98.5 F (36.9 C) (12/15 0300) Pulse Rate:  [75-169] 107 (12/15 0700) Resp:  [12-31] 17 (12/15 0700) BP: (76-135)/(58-105) 121/89 (12/15 0700) SpO2:  [95 %-100 %] 100 % (12/15 0700) Weight:  [154 lb 15.7 oz (70.3 kg)-160 lb 0.9 oz (72.6 kg)] 158 lb 1.1 oz (71.7 kg) (12/15 0500) Physical Exam: General: In NAD, sitting up in chair with girlfriend at side Cardiovascular: Tachycardic  rate, regular rhythm, no murmurs appreciated, no edema Respiratory: Normal work of breathing but loses breath when speaking in sentences, decreased breath sounds throughout, mostly in the bases. No wheezing or crackles appreciated Abdomen: soft, non distended, non tender, normal bowel sounds Extremities: no  edema  Laboratory: Recent Labs  Lab 11/23/2017 1205 12/05/2017 1225 11/27/17 0623  WBC 11.3*  --  11.3*  HGB 9.1* 11.2* 9.1*  HCT 30.1* 33.0* 30.2*  PLT 483*  --  273   Recent Labs  Lab 11/18/2017 1205 11/19/2017 1225  NA 133* 133*  K 3.8 3.8  CL 83* 80*  CO2 42*  --   BUN 5* 6  CREATININE 0.43* 0.50*  CALCIUM 9.1  --   GLUCOSE 106* 108*     Imaging/Diagnostic Tests: Dg Chest Port 1 View  Result Date: 11/16/2017 CLINICAL DATA:  Shortness of breath EXAM: PORTABLE CHEST 1 VIEW COMPARISON:  11/13/2017 FINDINGS: The cardio pericardial silhouette is enlarged. Asymmetric elevation left hemidiaphragm. Large left hilar and mediastinal lung mass again noted. Pleural effusion again noted on the left. Right lung relatively clear. The visualized bony structures of the thorax are intact. Telemetry leads overlie the chest. IMPRESSION: No substantial change. Asymmetric elevation left hemidiaphragm with large left hilar/mediastinal mass and left base collapse/consolidation with pleural effusion. Electronically Signed   By: Misty Stanley M.D.   On: 12/06/2017 12:19     Carlyle Dolly, MD 11/27/2017, 7:39 AM PGY-3, [place] Intern pager: [phone], text pages welcome

## 2017-11-27 NOTE — Progress Notes (Signed)
ANTICOAGULATION CONSULT NOTE - Follow Up Consult  Pharmacy Consult for heparin Indication: atrial fibrillation  Labs: Recent Labs    11/21/2017 1205 12/06/2017 1225 11/19/2017 1837 11/27/17 0004  HGB 9.1* 11.2*  --   --   HCT 30.1* 33.0*  --   --   PLT 483*  --   --   --   HEPARINUNFRC  --   --   --  <0.10*  CREATININE 0.43* 0.50*  --   --   TROPONINI  --   --  <0.03 <0.03    Assessment: 61yo male undetectable on heparin with initial dosing for Afib.  Goal of Therapy:  Heparin level 0.3-0.7 units/ml   Plan:  Will rebolus with heparin 3000 units and increase gtt by 4 units/kg/hr to 1300 units/hr and check level w/ next lab draw.  Wynona Neat, PharmD, BCPS  11/27/2017,1:01 AM

## 2017-11-27 NOTE — Procedures (Signed)
Intubation Procedure Note Levi Brown [id number] 1956/06/18  Procedure: Intubation Indications: Respiratory insufficiency  Procedure Details Consent: Unable to obtain consent because of emergent medical necessity. Time Out: Verified patient identification, verified procedure, site/side was marked, verified correct patient position, special equipment/implants available, medications/allergies/relevent history reviewed, required imaging and test results available.  Performed  Maximum sterile technique was used including antiseptics, cap, gloves, gown, hand hygiene and mask.  MAC and 4    Evaluation Hemodynamic Status: BP stable throughout; O2 sats: stable throughout Patient's Current Condition: stable Complications: No apparent complications Patient did tolerate procedure well. Chest X-ray ordered to verify placement.  CXR: pending.   Levi Brown 11/27/2017

## 2017-11-27 NOTE — Progress Notes (Signed)
Spoke with cards fellow on call [facility] regarding current vital signs (HR 150-160 and BP 110/90) and patient condition. Informed to maintain current plan. Will continue to monitor closely. Eleonore Chiquito RN 2 Heart

## 2017-11-27 NOTE — Progress Notes (Signed)
At 1630, pt was diaphoretic, increased SOB, and use of accessory muscle, lethargic. MD and RT notified.

## 2017-11-27 NOTE — Progress Notes (Addendum)
FPTS Interim Progress Note  *Late Note*  S: Notified by RN that patient's respiratory status is worsening after his CTA.  Went to bedside.  Patient sitting up in bed with nonrebreather mask on.  He is unable to provide any history as he appears in respiratory distress.  O: BP (!) 201/122   Pulse (!) 109   Temp 98.7 F (37.1 C) (Oral)   Resp 20   Ht 5\' 6"  (1.676 m)   Wt 158 lb 1.1 oz (71.7 kg)   SpO2 93%   BMI 25.51 kg/m   General: In respiratory distress, sitting up in bed, diaphoretic Cardiovascular: Tachycardic rate, regular rhythm Respiratory: Nonrebreather mask in place.  Increased work of breathing with accessory muscle usage.  Decreased/absent breath sounds on left lung field, crackles appreciated at right lung base.  A/P: Worsening acute on chronic respiratory failure: Patient with non-small cell lung cancer and COPD being treated for acute on chronic respiratory failure.  Initially presented in atrial flutter which was thought to be the cause of his dyspnea, rate was controlled with amiodarone drip.  He began to improve on this and was at his baseline of 3 L O2 nasal cannula earlier today.  Now he is worsening and requiring nonrebreather mask.  He appears diaphoretic and in respiratory distress.  CTA negative for PE but showing; stable large left upper lobe mass but enlarging left pleural effusion and new small right pleural effusion, the likely source of worsening dyspnea.  Additionally CTA showing severe narrowing of left upper lobe pulmonary artery as well as aortic arch dilatation.  He is full code. -Stat EKG, ABG -CCM consulted, appreciate their assistance -Cancel transfer orders to telemetry and keep in ICU -Continue to follow along closely  Carlyle Dolly, MD 11/27/2017, 5:45 PM PGY-3, [place] Medicine Service pager [phone]

## 2017-11-27 NOTE — Progress Notes (Signed)
Pharmacy Antibiotic Note  Levi Brown is a 61 y.o. male admitted on 11/25/2017 with pneumonia.  Pharmacy has been consulted for Vancomycin and Zosyn dosing. WBC elevated at 11.3. SCr 0.47. Patient has a history of severe protein calorie malnutrition and his SCr is likely not reflective of his true renal fx.   Plan: -Vancomycin 1500 mg IV once, then vancomycin 1000 mg IV Q 12 hours -Zosyn 3.375 gm IV once over 30 minutes, then Zosyn 3.375 gm IV Q 8 hours (EI infusion) -Monitor CBC, renal fx, cultures and clinical progress -VT at SS    Height: 5\' 6"  (167.6 cm) Weight: 158 lb 1.1 oz (71.7 kg) IBW/kg (Calculated) : 63.8  Temp (24hrs), Avg:98.3 F (36.8 C), Min:97.8 F (36.6 C), Max:98.7 F (37.1 C)  Recent Labs  Lab 12/11/2017 1205 11/24/2017 1225 11/27/17 0623  WBC 11.3*  --  11.3*  CREATININE 0.43* 0.50* 0.47*    Estimated Creatinine Clearance: 88.6 mL/min (A) (by C-G formula based on SCr of 0.47 mg/dL (L)).    No Known Allergies  Antimicrobials this admission: Vanc 12/15 >>  Zosyn 12/15 >>   Dose adjustments this admission: None   Microbiology results:   Thank you for allowing pharmacy to be a part of this patient's care.  Albertina Parr, PharmD., BCPS Clinical Pharmacist Pager [phone]

## 2017-11-27 NOTE — Progress Notes (Signed)
ANTICOAGULATION CONSULT NOTE - Initial Consult  Pharmacy Consult for Eliquis Indication: atrial fibrillation  No Known Allergies  Patient Measurements: Height: 5\' 6"  (167.6 cm) Weight: 158 lb 1.1 oz (71.7 kg) IBW/kg (Calculated) : 63.8  Vital Signs: Temp: 98.5 F (36.9 C) (12/15 0300) Temp Source: Oral (12/15 0300) BP: 121/89 (12/15 0700) Pulse Rate: 107 (12/15 0700)  Labs: Recent Labs    12/12/2017 1205 12/11/2017 1225 12/07/2017 1837 11/27/17 0004 11/27/17 0623  HGB 9.1* 11.2*  --   --  9.1*  HCT 30.1* 33.0*  --   --  30.2*  PLT 483*  --   --   --  273  HEPARINUNFRC  --   --   --  <0.10*  --   CREATININE 0.43* 0.50*  --   --  0.47*  TROPONINI  --   --  <0.03 <0.03 <0.03    Estimated Creatinine Clearance: 88.6 mL/min (A) (by C-G formula based on SCr of 0.47 mg/dL (L)).   Medical History: Past Medical History:  Diagnosis Date  . Former smoker    Quit smoking in early 2017  . Non-small cell cancer of left lung ([place]) 07/13/2017  . Stroke [facility]) 07/07/2017   Lacunar - pt unaware, but imaging suggests old strokes    Medications:  Medications Prior to Admission  Medication Sig Dispense Refill Last Dose  . albuterol (PROVENTIL HFA;VENTOLIN HFA) 108 (90 Base) MCG/ACT inhaler Inhale 2 puffs into the lungs every 4 (four) hours as needed for wheezing or shortness of breath (cough, shortness of breath or wheezing.). 54 g 3 prn  . aspirin EC 81 MG tablet Take 1 tablet (81 mg total) by mouth daily. 30 tablet 0 11/25/2017 at Unknown time  . feeding supplement (BOOST / RESOURCE BREEZE) LIQD Take 1 Container by mouth 3 (three) times daily between meals. 30 Container 0 11/25/2017 at Unknown time  . ferrous sulfate (FERROUSUL) 325 (65 FE) MG tablet Take 1 tablet (325 mg total) by mouth 2 (two) times daily with a meal. 60 tablet 3 11/25/2017 at Unknown time  . furosemide (LASIX) 20 MG tablet Take 1 tablet (20 mg total) by mouth daily as needed. 30 tablet 1 prn  . ipratropium-albuterol  (DUONEB) 0.5-2.5 (3) MG/3ML SOLN Take 3 mLs by nebulization every 4 (four) hours as needed. 360 mL 0 prn  . metoprolol tartrate (LOPRESSOR) 25 MG tablet Take 1 tablet (25 mg total) by mouth daily. 30 tablet 3 11/25/2017 at 8am  . Multiple Vitamins-Minerals (DRY EYE FORMULA PO) Place 1 drop into both eyes daily.   11/25/2017 at Unknown time  . ondansetron (ZOFRAN ODT) 4 MG disintegrating tablet Take 1 tablet (4 mg total) by mouth every 8 (eight) hours as needed for nausea or vomiting. 30 tablet 0 prn  . potassium chloride SA (K-DUR,KLOR-CON) 20 MEQ tablet Take 1 tablet (20 mEq total) by mouth daily. 5 tablet 0 11/25/2017 at Unknown time  . predniSONE (DELTASONE) 20 MG tablet Take 20 mg by mouth daily with breakfast.  1 11/25/2017 at Unknown time  . prochlorperazine (COMPAZINE) 10 MG tablet Take 1 tablet (10 mg total) by mouth every 6 (six) hours as needed for nausea or vomiting. 30 tablet 0 prn  . promethazine-codeine (PHENERGAN WITH CODEINE) 6.25-10 MG/5ML syrup Take 5 mLs by mouth every 6 (six) hours as needed for cough. 120 mL 0 prn  . senna-docusate (SENOKOT-S) 8.6-50 MG tablet Take 1-2 tablets by mouth at bedtime. (Patient taking differently: Take 1-2 tablets by mouth at bedtime  as needed for mild constipation. ) 60 tablet 0 prn  . sucralfate (CARAFATE) 1 GM/10ML suspension Take 10 mLs (1 g total) by mouth 3 (three) times daily with meals. (Patient taking differently: Take 1 g by mouth 3 (three) times daily as needed (ulcers). ) 420 mL 0 prn  . traZODone (DESYREL) 50 MG tablet Take 0.5-1 tablets (25-50 mg total) by mouth at bedtime as needed for sleep. 60 tablet 3 prn  . azithromycin (ZITHROMAX) 250 MG tablet Take 2 tabs PO x 1 dose, then 1 tab PO QD x 4 days (Patient taking differently: Take 250 mg by mouth See admin instructions. Take 2 tabs PO x 1 dose, then 1 tab PO QD x 4 days) 6 tablet 0 Not Taking   Scheduled:  . amiodarone  400 mg Oral BID  . feeding supplement  1 Container Oral TID BM  .  ferrous sulfate  325 mg Oral BID WC  . senna-docusate  1-2 tablet Oral QHS  . sucralfate  1 g Oral TID WC   Infusions:  . sodium chloride      Assessment: 61yo male had been started on heparin for Afib, now to transition to Eliquis long-term given high risk w/ cancer dx.   Plan:  Will begin Eliquis 5mg  PO BID and begin anticoag education.  Wynona Neat, PharmD, BCPS  11/27/2017,8:14 AM

## 2017-11-27 NOTE — Procedures (Signed)
Procedure: Emergent endotracheal intubation Indication: Hypercapneic respiratory failure  The patient was intubated with a 8.0 ETT following sedation with fentanyl 75 mcg and etomidate 20 mg using the glidescope. The tube was secured at 25 cm. Post-procedure PCXR verified tip at ~2 cm above the carina.

## 2017-11-28 ENCOUNTER — Inpatient Hospital Stay (HOSPITAL_COMMUNITY): Payer: Medicaid Other

## 2017-11-28 DIAGNOSIS — R6521 Severe sepsis with septic shock: Secondary | ICD-10-CM

## 2017-11-28 DIAGNOSIS — J9601 Acute respiratory failure with hypoxia: Secondary | ICD-10-CM

## 2017-11-28 DIAGNOSIS — A419 Sepsis, unspecified organism: Secondary | ICD-10-CM

## 2017-11-28 LAB — GLUCOSE, CAPILLARY
GLUCOSE-CAPILLARY: 115 mg/dL — AB (ref 65–99)
GLUCOSE-CAPILLARY: 134 mg/dL — AB (ref 65–99)
GLUCOSE-CAPILLARY: 114 mg/dL — AB (ref 65–99)
Glucose-Capillary: 90 mg/dL (ref 65–99)

## 2017-11-28 LAB — ABO/RH: ABO/RH(D): O POS

## 2017-11-28 LAB — PHOSPHORUS
Phosphorus: 2.1 mg/dL — ABNORMAL LOW (ref 2.5–4.6)
Phosphorus: 2.4 mg/dL — ABNORMAL LOW (ref 2.5–4.6)

## 2017-11-28 LAB — BASIC METABOLIC PANEL
ANION GAP: 10 (ref 5–15)
CHLORIDE: 87 mmol/L — AB (ref 101–111)
CO2: 35 mmol/L — AB (ref 22–32)
Calcium: 8.5 mg/dL — ABNORMAL LOW (ref 8.9–10.3)
Creatinine, Ser: 0.61 mg/dL (ref 0.61–1.24)
GFR calc Af Amer: >60 mL/min (ref >60)
GFR calc non Af Amer: >60 mL/min (ref >60)
GLUCOSE: 111 mg/dL — AB (ref 65–99)
Potassium: 3.4 mmol/L — ABNORMAL LOW (ref 3.5–5.1)
Sodium: 132 mmol/L — ABNORMAL LOW (ref 135–145)

## 2017-11-28 LAB — CBC
HEMATOCRIT: 21.7 % — AB (ref 39.0–52.0)
HEMOGLOBIN: 6.7 g/dL — AB (ref 13.0–17.0)
MCH: 24.6 pg — AB (ref 26.0–34.0)
MCHC: 30.9 g/dL (ref 30.0–36.0)
MCV: 79.8 fL (ref 78.0–100.0)
Platelets: 396 10*3/uL (ref 150–400)
RBC: 2.72 MIL/uL — ABNORMAL LOW (ref 4.22–5.81)
RDW: 17.6 % — ABNORMAL HIGH (ref 11.5–15.5)
WBC: 11.4 10*3/uL — AB (ref 4.0–10.5)

## 2017-11-28 LAB — COOXEMETRY PANEL
Carboxyhemoglobin: 1.4 % (ref 0.5–1.5)
Methemoglobin: 1 % (ref 0.0–1.5)
O2 SAT: 61.1 %
TOTAL HEMOGLOBIN: 6.8 g/dL — AB (ref 12.0–16.0)

## 2017-11-28 LAB — URINE CULTURE
CULTURE: NO GROWTH
Special Requests: NORMAL

## 2017-11-28 LAB — CORTISOL: Cortisol, Plasma: 20.2 ug/dL

## 2017-11-28 LAB — MAGNESIUM
MAGNESIUM: 1.5 mg/dL — AB (ref 1.7–2.4)
MAGNESIUM: 1.5 mg/dL — AB (ref 1.7–2.4)

## 2017-11-28 LAB — PREPARE RBC (CROSSMATCH)

## 2017-11-28 LAB — PROCALCITONIN: Procalcitonin: 0.37 ng/mL

## 2017-11-28 MED ORDER — AMIODARONE HCL 200 MG PO TABS
400.0000 mg | ORAL_TABLET | Freq: Two times a day (BID) | ORAL | Status: DC
  Administered 2017-11-28 – 2017-11-29 (×3): 400 mg
  Filled 2017-11-28 (×3): qty 2

## 2017-11-28 MED ORDER — SODIUM CHLORIDE 0.9 % IV SOLN: INTRAVENOUS | Status: DC | PRN

## 2017-11-28 MED ORDER — PRO-STAT SUGAR FREE PO LIQD
30.0000 mL | Freq: Two times a day (BID) | ORAL | Status: DC
  Administered 2017-11-28 (×2): 30 mL
  Filled 2017-11-28 (×2): qty 30

## 2017-11-28 MED ORDER — FENTANYL CITRATE (PF) 100 MCG/2ML IJ SOLN
INTRAMUSCULAR | Status: AC
  Filled 2017-11-28: qty 2

## 2017-11-28 MED ORDER — FENTANYL CITRATE (PF) 100 MCG/2ML IJ SOLN
100.0000 ug | INTRAMUSCULAR | Status: DC | PRN
  Filled 2017-11-28: qty 2

## 2017-11-28 MED ORDER — MAGNESIUM SULFATE 2 GM/50ML IV SOLN
2.0000 g | Freq: Once | INTRAVENOUS | Status: AC
  Administered 2017-11-28: 2 g via INTRAVENOUS
  Filled 2017-11-28: qty 50

## 2017-11-28 MED ORDER — SODIUM CHLORIDE 0.9% FLUSH
10.0000 mL | Freq: Two times a day (BID) | INTRAVENOUS | Status: DC
  Administered 2017-11-28: 10 mL
  Administered 2017-11-28: 20 mL
  Administered 2017-11-29 – 2017-12-05 (×13): 10 mL
  Administered 2017-12-06: 20 mL
  Administered 2017-12-07: 30 mL
  Administered 2017-12-07: 10 mL
  Administered 2017-12-08: 30 mL
  Administered 2017-12-08 – 2017-12-09 (×2): 10 mL

## 2017-11-28 MED ORDER — ACETAMINOPHEN 160 MG/5ML PO SOLN
650.0000 mg | Freq: Four times a day (QID) | ORAL | Status: DC | PRN
  Administered 2017-11-28 (×2): 650 mg
  Filled 2017-11-28 (×2): qty 20.3

## 2017-11-28 MED ORDER — FENTANYL CITRATE (PF) 100 MCG/2ML IJ SOLN
100.0000 ug | INTRAMUSCULAR | Status: DC | PRN
  Administered 2017-11-28: 100 ug via INTRAVENOUS

## 2017-11-28 MED ORDER — CHLORHEXIDINE GLUCONATE CLOTH 2 % EX PADS: 6.0000 | MEDICATED_PAD | Freq: Every day | CUTANEOUS | Status: DC

## 2017-11-28 MED ORDER — DEXTROSE 5 % IV SOLN
0.0000 ug/min | INTRAVENOUS | Status: DC
  Filled 2017-11-28: qty 16

## 2017-11-28 MED ORDER — CHLORHEXIDINE GLUCONATE CLOTH 2 % EX PADS
6.0000 | MEDICATED_PAD | Freq: Every day | CUTANEOUS | Status: DC
  Administered 2017-11-29 – 2017-12-01 (×4): 6 via TOPICAL

## 2017-11-28 MED ORDER — VITAL HIGH PROTEIN PO LIQD
1000.0000 mL | ORAL | Status: DC
  Administered 2017-11-28 – 2017-11-29 (×2): 1000 mL

## 2017-11-28 MED ORDER — SODIUM CHLORIDE 0.9 % IV SOLN
Freq: Once | INTRAVENOUS | Status: AC
  Administered 2017-11-28: 10:00:00 via INTRAVENOUS

## 2017-11-28 MED ORDER — PANTOPRAZOLE SODIUM 40 MG IV SOLR
40.0000 mg | INTRAVENOUS | Status: DC
  Administered 2017-11-28 – 2017-12-05 (×8): 40 mg via INTRAVENOUS
  Filled 2017-11-28 (×8): qty 40

## 2017-11-28 MED ORDER — APIXABAN 5 MG PO TABS
5.0000 mg | ORAL_TABLET | Freq: Two times a day (BID) | ORAL | Status: DC
  Administered 2017-11-28 – 2017-11-29 (×3): 5 mg
  Filled 2017-11-28 (×3): qty 1

## 2017-11-28 MED ORDER — SODIUM CHLORIDE 0.9% FLUSH: 10.0000 mL | INTRAVENOUS | Status: DC | PRN

## 2017-11-28 MED ORDER — SENNOSIDES-DOCUSATE SODIUM 8.6-50 MG PO TABS
1.0000 | ORAL_TABLET | Freq: Every day | ORAL | Status: DC
  Administered 2017-11-28: 1
  Filled 2017-11-28: qty 1

## 2017-11-28 NOTE — Progress Notes (Addendum)
Pt more hypotensive and tachycardic this AM (SBP 90s, HR 120s, sinus tach). Spoke with Warren Lacy MD, Dr. Jimmy Footman who asked cardiology be notified to address these concerns. Paged Dr. Otelia Sergeant who requested CCM insert CVC and then MD will make decision on where to go from there. [place] notified.

## 2017-11-28 NOTE — Progress Notes (Addendum)
Patient ID: Levi Brown, male   DOB: 1956/03/25, 61 y.o.   MRN: [id number]   Progress Note  Patient Name: Levi Brown Date of Encounter: 11/28/2017  Primary Cardiologist: No primary care provider on file.   Subjective   Converted atrial flutter => sinus tachy overnight 12/14.  Increased respiratory distress during day yesterday.    CTA chest:  No PE, stable LUL mass with severe narrowing of LUL pulmonary artery, possible PNA lingula/LLL, large left pleural effusion.   Blood cultures done, started on vanco/Zosyn, intubated with severe hypercarbia.  Tm 100.   BP soft this morning, SBP in 80s. Hgb 6.7 this morning, no overt bleeding noted.   Inpatient Medications    Scheduled Meds: . amiodarone  400 mg Per Tube BID  . apixaban  5 mg Per Tube BID  . chlorhexidine gluconate (MEDLINE KIT)  15 mL Mouth Rinse BID  . Chlorhexidine Gluconate Cloth  6 each Topical Daily  . Influenza vac split quadrivalent PF  0.5 mL Intramuscular Tomorrow-1000  . ipratropium-albuterol  3 mL Nebulization Q4H  . mouth rinse  15 mL Mouth Rinse QID  . senna-docusate  1-2 tablet Per Tube QHS  . sodium chloride flush  10-40 mL Intracatheter Q12H   Continuous Infusions: . sodium chloride 75 mL/hr at 11/28/17 0631  . sodium chloride    . norepinephrine (LEVOPHED) Adult infusion    . piperacillin-tazobactam (ZOSYN)  IV Stopped (11/28/17 0401)  . propofol (DIPRIVAN) infusion 30 mcg/kg/min (11/28/17 0631)  . vancomycin     PRN Meds: Place/Maintain arterial line **AND** sodium chloride, albuterol, fentaNYL (SUBLIMAZE) injection, fentaNYL (SUBLIMAZE) injection, midazolam, sodium chloride flush   Vital Signs    Vitals:   11/28/17 0400 11/28/17 0500 11/28/17 0600 11/28/17 0700  BP: (!) 88/60 95/64 (!) 82/64 (!) 80/63  Pulse: (!) 125     Resp: (!) 30 (!) 30 (!) 30 (!) 30  Temp: 99.9 F (37.7 C)  100 F (37.8 C) 99.7 F (37.6 C)  TempSrc: Esophageal     SpO2: 100%     Weight:  156 lb 8.4 oz (71  kg)    Height:        Intake/Output Summary (Last 24 hours) at 11/28/2017 0735 Last data filed at 11/28/2017 0700 Gross per 24 hour  Intake 2357.19 ml  Output 750 ml  Net 1607.19 ml   Filed Weights   12/02/2017 2300 11/27/17 0500 11/28/17 0500  Weight: 154 lb 15.7 oz (70.3 kg) 158 lb 1.1 oz (71.7 kg) 156 lb 8.4 oz (71 kg)    Telemetry    Sinus tachy 110s - Personally Reviewed   Physical Exam   General: Intubated/sedated Neck: No JVD, no thyromegaly or thyroid nodule.  Lungs: Decreased breath sounds with rhonchi on left. CV: Nondisplaced PMI.  Heart tachy, regular S1/S2, no S3/S4, no murmur.  Trace ankle edema.   Abdomen: Soft, no hepatosplenomegaly, no distention.  Skin: Intact without lesions or rashes.  Neurologic: Sedated  Extremities: No clubbing or cyanosis.  HEENT: Normal.    Labs    Chemistry Recent Labs  Lab 12/01/2017 1205 11/27/2017 1225 11/25/2017 1837 11/27/17 0623  NA 133* 133*  --  131*  K 3.8 3.8  --  4.0  CL 83* 80*  --  84*  CO2 42*  --   --  35*  GLUCOSE 106* 108*  --  139*  BUN 5* 6  --  5*  CREATININE 0.43* 0.50*  --  0.47*  CALCIUM 9.1  --   --  8.8*  ALBUMIN  --   --  2.6*  --   GFRNONAA >60  --   --  >60  GFRAA >60  --   --  >60  ANIONGAP 8  --   --  12     Hematology Recent Labs  Lab 11/28/2017 1205 11/17/2017 1225 11/27/17 0623 11/27/17 1001  WBC 11.3*  --  11.3*  --   RBC 3.64*  --  3.62* 3.60*  HGB 9.1* 11.2* 9.1*  --   HCT 30.1* 33.0* 30.2*  --   MCV 82.7  --  83.4  --   MCH 25.0*  --  25.1*  --   MCHC 30.2  --  30.1  --   RDW 17.9*  --  17.6*  --   PLT 483*  --  273  --     Cardiac Enzymes Recent Labs  Lab 12/04/2017 1837 11/27/17 0004 11/27/17 0623  TROPONINI <0.03 <0.03 <0.03    Recent Labs  Lab 11/13/2017 1223  TROPIPOC 0.00     BNPNo results for input(s): BNP, PROBNP in the last 168 hours.   DDimer No results for input(s): DDIMER in the last 168 hours.   Radiology    Ct Angio Chest Pe W Or Wo  Contrast  Result Date: 11/27/2017 CLINICAL DATA:  Shortness of breath, tachycardia EXAM: CT ANGIOGRAPHY CHEST WITH CONTRAST TECHNIQUE: Multidetector CT imaging of the chest was performed using the standard protocol during bolus administration of intravenous contrast. Multiplanar CT image reconstructions and MIPs were obtained to evaluate the vascular anatomy. CONTRAST:  37m ISOVUE-370 IOPAMIDOL (ISOVUE-370) INJECTION 76% COMPARISON:  11/13/2017 FINDINGS: Cardiovascular: No filling defects in the pulmonary artery is to suggest pulmonary emboli. Right side aortic arch noted. There is a retroesophageal left subclavian artery. Cardiomegaly, small pericardial effusion. 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 Left pulmonary artery is markedly narrowed by the left upper lobe mass, stable. Mediastinum/Nodes: No visible measurable mediastinal, right hilar or axillary adenopathy. Lungs/Pleura: Large anterior left upper lung and mediastinal mass is again noted measuring 9.0 x 6.6 cm compared to 9.0 x 6.2 cm previously, likely not significantly changed. Large left pleural effusion now noted, increased since prior study compressive atelectasis or pneumonia noted in the lingula and left lower lobe with only a small amount of aerated left upper lobe noted. Underlying emphysema. Small right pleural effusion is new since prior study. Upper Abdomen: Imaging into the upper abdomen shows no acute findings. Musculoskeletal: Chest wall soft tissues are unremarkable. No acute bony abnormality. Review of the MIP images confirms the above findings. IMPRESSION: Large left upper lobe mass again noted, invading the mediastinum, unchanged since prior study. There is enlarging large left effusion and new small right effusion. Only a small amount of aerated left upper lobe now present. Airspace disease within the lingula and left lower lobe could reflect atelectasis or pneumonia. Cardiomegaly, small  pericardial effusion. Severe attenuation/narrowing of the left upper lobe pulmonary artery by the left upper lobe mass. No evidence of pulmonary embolus. 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 Recommend attention on follow-up imaging. Electronically Signed   By: KRolm BaptiseM.D.   On: 11/27/2017 16:10   Dg Chest Port 1 View  Result Date: 11/28/2017 CLINICAL DATA:  Initial evaluation status post central line placement. EXAM: PORTABLE CHEST 1 VIEW COMPARISON:  Prior radiograph from 11/27/2017. FINDINGS: Patient remains intubated with the tip of the endotracheal tube positioned approximately  2 cm above the carina. Enteric tube courses in the abdomen, side hole beyond the GE junction. There has been interval placement of a right IJ approach centra venous catheter with tip overlying the mid SVC allowing for patient rotation. Defibrillator pads in place. Stable cardiomegaly. Mediastinal silhouette unchanged. Elevated left hemidiaphragm. Small bilateral pleural effusions. Persistent hazy bibasilar opacities, which may reflect atelectasis or infiltrates. Underlying mild pulmonary interstitial congestion. No pneumothorax. Osseous structures unchanged. IMPRESSION: 1. Interval placement of right IJ approach centra venous catheter with tip overlying the mid SVC. 2. Remaining support apparatus in good position. 3. Persistent elevation of the left hemidiaphragm with small bilateral pleural effusions. Associated bibasilar opacities may reflect atelectasis or infiltrates. 4. Underlying mild pulmonary interstitial congestion. Electronically Signed   By: Jeannine Boga M.D.   On: 11/28/2017 05:36   Dg Chest Port 1 View  Result Date: 11/27/2017 CLINICAL DATA:  ETT placement EXAM: PORTABLE CHEST 1 VIEW COMPARISON:  Chest x-ray November 26, 2017 FINDINGS: The ETT terminates 2.6 cm above the carina in good position. Left chest is partially obscured by a transcutaneous pacer. No  pneumothorax. The left hemidiaphragm is elevated. There is a left effusion with underlying opacity. Minimal opacity in the right base is seen as well. No other acute abnormalities. The NG tube terminates in the stomach. IMPRESSION: 1. The support apparatus is in good position. 2. Elevated left hemidiaphragm. There is a left effusion with underlying atelectasis. 3. Mild hazy opacity in the right base. 4. Electronically Signed   By: Dorise Bullion III M.D   On: 11/27/2017 18:06   Dg Chest Port 1 View  Result Date: 11/25/2017 CLINICAL DATA:  Shortness of breath EXAM: PORTABLE CHEST 1 VIEW COMPARISON:  11/13/2017 FINDINGS: The cardio pericardial silhouette is enlarged. Asymmetric elevation left hemidiaphragm. Large left hilar and mediastinal lung mass again noted. Pleural effusion again noted on the left. Right lung relatively clear. The visualized bony structures of the thorax are intact. Telemetry leads overlie the chest. IMPRESSION: No substantial change. Asymmetric elevation left hemidiaphragm with large left hilar/mediastinal mass and left base collapse/consolidation with pleural effusion. Electronically Signed   By: Misty Stanley M.D.   On: 12/06/2017 12:19     Patient Profile     Levi Brown is a 61 y.o. 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  Assessment & Plan    1. Atrial flutter - Likely triggered by metastatic lung CA.  He remains in NSR on amiodarone and Eliquis. Given cancer diagnosis, would recommend keeping him on anticoagulation as long as bleeding risk is not excessive. Needs to continue at least 1 month with acute  conversion to NSR.   2. Metastatic lung cancer with recurrent left pleural effusion - CT this admission shows larger left effusion and stable LUL mass.  Suspect he will need repeat thoracentesis.    3. Acute hypercarbic/hypoxemic respiratory failure - Now intubated.  Has large left effusion and possible post-obstructive PNA. Vent per CCM.   4. ID - Suspect post-obstructive PNA.  Tm 100.  Now on vancomycin/Zosyn and blood cultures sent.    5. Hypotension - SBP 80s this morning, ?early septic shock.  Will order norepinpephrine to keep MAP >  65.  Measure CVP to see if he needs IV fluid - 11 this morning, can stop IV fluid as he appears replete (will get volume with blood).  Place arterial line.  6. Anemia - Hgb down to 6.7 this morning from 9.1.  No overt bleeding seen.  Will need to transfuse 2 units, guaiac stool.   For questions or updates, please contact [facility] Please consult [email or web address] for contact info under Cardiology/STEMI.      Signed, Loralie Champagne, MD  11/28/2017, 7:35 AM

## 2017-11-28 NOTE — Progress Notes (Signed)
PULMONARY / CRITICAL CARE MEDICINE   Name: Levi Brown MRN: [id number] DOB: Apr 26, 1956    ADMISSION DATE:  12/08/2017 CONSULTATION DATE:  11/27/2017  REFERRING MD:  Claudine Mouton  CHIEF COMPLAINT:  Hypercapneic respiratory failure  HISTORY OF PRESENT ILLNESS:   61 yo male former smoker admitted with dyspnea in setting of A flutter.  He was dx with Stage IV NSCLC in July 2018 tx with radiation to Lt mediastinal mass, and was being evaluated to start chemotherapy.  He was seen by oncology on 11/18/17, and at that time was still deciding whether he would want chemotherapy.  He developed worsening respiratory distress 12/15 and required intubation.  He was found to have post-obstructive pneumonia.  SUBJECTIVE:  Intubated.  Febrile overnight.  BP soft but stable.  VITAL SIGNS: BP (!) 85/66   Pulse (!) 121   Temp 100.2 F (37.9 C)   Resp (!) 30   Ht 5\' 6"  (1.676 m)   Wt 71 kg (156 lb 8.4 oz)   SpO2 100%   BMI 25.26 kg/m   VENTILATOR SETTINGS: Vent Mode: PRVC FiO2 (%):  [40 %-100 %] 40 % Set Rate:  [20 bmp-30 bmp] 30 bmp Vt Set:  [400 mL] 400 mL PEEP:  [5 cmH20] 5 cmH20 Plateau Pressure:  [20 EYC14-48 cmH20] 20 cmH20  INTAKE / OUTPUT: I/O last 3 completed shifts: In: 3731.9 [P.O.:1590; I.V.:1486.9; NG/GT:55; IV Piggyback:600] Out: 1350 [Urine:1350]  PHYSICAL EXAMINATION: General: Chronically ill-appearing male, no acute distress on vent Neuro: Sedated, RASS-1 HEENT:  /AT PRRL O-P: unremarkable Cardiovascular:  Tachy but regular. No murmur Lungs: Respirations even nonlabored on vent, diminished left, few scattered rhonchi right  Abdomen:  Slightly protuberant but supple. No guarding, masses or organomegaly Musculoskeletal:  No obvious joint deformities Skin:  No rashes  LABS:  BMET Recent Labs  Lab 12/08/2017 1205 11/23/2017 1225 11/27/17 0623 11/28/17 0644  NA 133* 133* 131* 132*  K 3.8 3.8 4.0 3.4*  CL 83* 80* 84* 87*  CO2 42*  --  35* 35*  BUN 5* 6 5* <5*   CREATININE 0.43* 0.50* 0.47* 0.61  GLUCOSE 106* 108* 139* 111*    Electrolytes Recent Labs  Lab 11/17/2017 1205 11/27/17 0623 11/28/17 0644  CALCIUM 9.1 8.8* 8.5*  MG  --   --  1.5*  PHOS  --   --  2.1*    CBC Recent Labs  Lab 12/02/2017 1205 12/01/2017 1225 11/27/17 0623 11/28/17 0644  WBC 11.3*  --  11.3* 11.4*  HGB 9.1* 11.2* 9.1* 6.7*  HCT 30.1* 33.0* 30.2* 21.7*  PLT 483*  --  273 396    Coag's No results for input(s): APTT, INR in the last 168 hours.  Sepsis Markers Recent Labs  Lab 11/28/17 0644  PROCALCITON 0.37    ABG Recent Labs  Lab 11/27/17 1653 11/27/17 1814 11/27/17 2010  PHART 7.148* 7.285* 7.462*  PCO2ART ABOVE REPORTABLE RANGE,RBV C.SMALLWOOD BY A.MCKINNEY AT 1856 ON 11/27/2017 88.1* 55.0*  PO2ART 291.00* 405.0* 140.0*    Liver Enzymes Recent Labs  Lab 11/18/2017 1837  ALBUMIN 2.6*    Cardiac Enzymes Recent Labs  Lab 11/22/2017 1837 11/27/17 0004 11/27/17 0623  TROPONINI <0.03 <0.03 <0.03    Glucose Recent Labs  Lab 11/27/17 1643  GLUCAP 173*    Imaging Ct Angio Chest Pe W Or Wo Contrast  Result Date: 11/27/2017 CLINICAL DATA:  Shortness of breath, tachycardia EXAM: CT ANGIOGRAPHY CHEST WITH CONTRAST TECHNIQUE: Multidetector CT imaging of the chest was performed using  the standard protocol during bolus administration of intravenous contrast. Multiplanar CT image reconstructions and MIPs were obtained to evaluate the vascular anatomy. CONTRAST:  41mL ISOVUE-370 IOPAMIDOL (ISOVUE-370) INJECTION 76% COMPARISON:  11/13/2017 FINDINGS: Cardiovascular: No filling defects in the pulmonary artery is to suggest pulmonary emboli. Right side aortic arch noted. There is a retroesophageal left subclavian artery. Cardiomegaly, small pericardial effusion. 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 Left pulmonary artery is markedly narrowed by the left upper lobe mass, stable. Mediastinum/Nodes: No visible  measurable mediastinal, right hilar or axillary adenopathy. Lungs/Pleura: Large anterior left upper lung and mediastinal mass is again noted measuring 9.0 x 6.6 cm compared to 9.0 x 6.2 cm previously, likely not significantly changed. Large left pleural effusion now noted, increased since prior study compressive atelectasis or pneumonia noted in the lingula and left lower lobe with only a small amount of aerated left upper lobe noted. Underlying emphysema. Small right pleural effusion is new since prior study. Upper Abdomen: Imaging into the upper abdomen shows no acute findings. Musculoskeletal: Chest wall soft tissues are unremarkable. No acute bony abnormality. Review of the MIP images confirms the above findings. IMPRESSION: Large left upper lobe mass again noted, invading the mediastinum, unchanged since prior study. There is enlarging large left effusion and new small right effusion. Only a small amount of aerated left upper lobe now present. Airspace disease within the lingula and left lower lobe could reflect atelectasis or pneumonia. Cardiomegaly, small pericardial effusion. Severe attenuation/narrowing of the left upper lobe pulmonary artery by the left upper lobe mass. No evidence of pulmonary embolus. 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 Recommend attention on follow-up imaging. Electronically Signed   By: Rolm Baptise M.D.   On: 11/27/2017 16:10   Dg Chest Port 1 View  Result Date: 11/28/2017 CLINICAL DATA:  Initial evaluation status post central line placement. EXAM: PORTABLE CHEST 1 VIEW COMPARISON:  Prior radiograph from 11/27/2017. FINDINGS: Patient remains intubated with the tip of the endotracheal tube positioned approximately 2 cm above the carina. Enteric tube courses in the abdomen, side hole beyond the GE junction. There has been interval placement of a right IJ approach centra venous catheter with tip overlying the mid SVC allowing for patient  rotation. Defibrillator pads in place. Stable cardiomegaly. Mediastinal silhouette unchanged. Elevated left hemidiaphragm. Small bilateral pleural effusions. Persistent hazy bibasilar opacities, which may reflect atelectasis or infiltrates. Underlying mild pulmonary interstitial congestion. No pneumothorax. Osseous structures unchanged. IMPRESSION: 1. Interval placement of right IJ approach centra venous catheter with tip overlying the mid SVC. 2. Remaining support apparatus in good position. 3. Persistent elevation of the left hemidiaphragm with small bilateral pleural effusions. Associated bibasilar opacities may reflect atelectasis or infiltrates. 4. Underlying mild pulmonary interstitial congestion. Electronically Signed   By: Jeannine Boga M.D.   On: 11/28/2017 05:36   Dg Chest Port 1 View  Result Date: 11/27/2017 CLINICAL DATA:  ETT placement EXAM: PORTABLE CHEST 1 VIEW COMPARISON:  Chest x-ray November 26, 2017 FINDINGS: The ETT terminates 2.6 cm above the carina in good position. Left chest is partially obscured by a transcutaneous pacer. No pneumothorax. The left hemidiaphragm is elevated. There is a left effusion with underlying opacity. Minimal opacity in the right base is seen as well. No other acute abnormalities. The NG tube terminates in the stomach. IMPRESSION: 1. The support apparatus is in good position. 2. Elevated left hemidiaphragm. There is a left effusion with underlying  atelectasis. 3. Mild hazy opacity in the right base. 4. Electronically Signed   By: Dorise Bullion III M.D   On: 11/27/2017 18:06     STUDIES:  CTA chest 12/15 > >>No PE, stable LUL mass of LUL pulmonary artery, possible lingular/left lower lobe pneumonia, large left pleural effusion  CULTURES: Blood cultures x2 12/15 >> Tracheal aspirate 12/15 >> Urine 12/15 >>  ANTIBIOTICS: vanc 12/15>>> Zosyn 12/15 >>  SIGNIFICANT EVENTS: 12/15 Acute hypercapneic respiratory failure  LINES/TUBES: ETT  12/15>>> R IJ CVL 12/15>>>  DISCUSSION: 61 year old male with history of stage IV non-small cell lung cancer with left-sided obstructing mediastinal mass admitted 12/15 with acute respiratory failure related to postobstructive pneumonia and atrial flutter.  ASSESSMENT / PLAN:  PULMONARY A: Acute hypoxic/hypercapnic respiratory failure from post obstructive pneumonia, pleural effusion. P:   Vent support - 8cc/kg  F/u CXR  F/u ABG Antibiotics as above Daily SBT May need to consider thoracentesis at some point -we will hold off for now   CARDIOVASCULAR A:  Converted atrial flutter on amiodarone Hypotension P:  Currently in normal sinus rhythm on amiodarone and Eliquis Continue at anticoagulation Cardiology following Monitor CVP Levophed if needed - currently maintaining MAP without pressors  RENAL A:   Hyponatremia-mild P: Follow-up chemistry in a.m.  GASTROINTESTINAL A:   Nutrition P:   Tube feeding per nutrition PPI   HEMATOLOGIC A:   Anemia-normocytic-likely related to underlying malignancy P:  1 unit PRBC this a.m. Follow-up CBC Continue Eliquis and monitor  INFECTIOUS A:   Post-obstructive HCAP P:   Continue Vanco and Zosyn as above Follow culture data Trend pro calcitonin and lactate  Diagnostic evaluation of pleural fluid if undergoes thoracentesis   ENDOCRINE A:   Borderline elevated TSH with nl free T4 P:   Primary service had planned to repeat in 6 weeks as OPD. No intervention warranted at present Monitor glucose on chemistry  NEUROLOGIC A:   Sedated post intubation P:   RASS goal: -1    FAMILY  - Updates: Girlfriend updated at length at bedside 12/16.  Further family members scheduled to arrive later today    Nickolas Madrid, NP 11/28/2017  10:56 AM Pager: [phone] or [phone]  He remains on vent.    BP (!) 84/61   Pulse (!) 114   Temp 99.7 F (37.6 C)   Resp (!) 30   Ht 5\' 6"  (1.676 m)   Wt 156 lb 8.4  oz (71 kg)   SpO2 100%   BMI 25.26 kg/m   Sedated.  HR regular.  Decreased BS.  Abdomen soft.  Assessment/plan:  Acute respiratory failure in setting of post obstructive pneumonia. - full vent support - continue Abx  Pleural effusion. - f/u CXR  A flutter >> back in sinus. - per cardiology  Hypotension. - reassess after transfusion  Stage IV NSCLC. - will need to have further d/w family about goals of care - per oncology note 11/18/17, pt hadn't decided whether he would want chemotherapy  Updated family at bedside  CC time by me independent of APP time 31 minutes  Chesley Mires, MD [place] 11/28/2017, 2:30 PM Pager:  [phone] After 3pm call: [phone]

## 2017-11-28 NOTE — Progress Notes (Signed)
**  Social Note**  Mr. Stefan is a 61 yo M with non-small cell lung cancer diagnosed in July 2018 and chronic respiratory failure who developed worsening respiratory distress requiring intubation on 12/15. Appreciate CCM's excellent care. Will continue to follow along socially and take over care once patient stable for step down.   Carlyle Dolly, MD 11/27/2017, 7:39 AM PGY-3, [place] Intern pager: [phone], text pages welcome

## 2017-11-28 NOTE — Progress Notes (Signed)
[name] Progress Note Patient Name: Levi Brown DOB: 01/13/1956 MRN: [id number]   Date of Service  11/28/2017  HPI/Events of Note  Notified of need for stress ulcer prophylaxis. Patient is on mechanical ventilation.   eICU Interventions  Will order Protonix IV.      Intervention Category Intermediate Interventions: Best-practice therapies (e.g. DVT, beta blocker, etc.)  Sommer,Steven Eugene 11/28/2017, 8:13 PM

## 2017-11-28 NOTE — Progress Notes (Signed)
Dr. Eula Fried cards notified of central line placement and confirmation. MD does not want to start pressors unless MAP< 60. Will continue to monitor closely.

## 2017-11-28 NOTE — Progress Notes (Signed)
RT attempted Aline times 4 attempts between 2 RTs with no success.  RN made aware.  RT will continue to monitor.

## 2017-11-28 NOTE — Procedures (Signed)
Central Venous Catheter Insertion Procedure Note Levi Brown [id number] [birth year]  Procedure: Insertion of Central Venous Catheter Indications: Assessment of intravascular volume, Drug and/or fluid administration and Frequent blood sampling  Procedure Details Consent: Unable to obtain consent because of emergent medical necessity. Time Out: Verified patient identification, verified procedure, site/side was marked, verified correct patient position, special equipment/implants available, medications/allergies/relevent history reviewed, required imaging and test results available.  Performed  Maximum sterile technique was used including antiseptics, cap, gloves, gown, hand hygiene, mask and sheet. Skin prep: Chlorhexidine; local anesthetic administered A antimicrobial bonded/coated triple lumen catheter was placed in the right internal jugular vein using the Seldinger technique.  Evaluation Blood flow good Complications: No apparent complications Patient did tolerate procedure well. Chest X-ray ordered to verify placement.  CXR: pending.  Hayden Pedro, AGACNP-BC [facility]  Pgr: [phone]  PCCM Pgr: [phone]

## 2017-11-29 ENCOUNTER — Inpatient Hospital Stay (HOSPITAL_COMMUNITY): Payer: Medicaid Other

## 2017-11-29 DIAGNOSIS — I483 Typical atrial flutter: Secondary | ICD-10-CM

## 2017-11-29 DIAGNOSIS — C3490 Malignant neoplasm of unspecified part of unspecified bronchus or lung: Principal | ICD-10-CM

## 2017-11-29 DIAGNOSIS — J449 Chronic obstructive pulmonary disease, unspecified: Secondary | ICD-10-CM

## 2017-11-29 DIAGNOSIS — J189 Pneumonia, unspecified organism: Secondary | ICD-10-CM

## 2017-11-29 DIAGNOSIS — I4891 Unspecified atrial fibrillation: Secondary | ICD-10-CM

## 2017-11-29 DIAGNOSIS — J9602 Acute respiratory failure with hypercapnia: Secondary | ICD-10-CM | POA: Diagnosis present

## 2017-11-29 LAB — TYPE AND SCREEN
ABO/RH(D): O POS
Antibody Screen: NEGATIVE
UNIT DIVISION: 0
Unit division: 0

## 2017-11-29 LAB — VANCOMYCIN, TROUGH: Vancomycin Tr: 9 ug/mL — ABNORMAL LOW (ref 15–20)

## 2017-11-29 LAB — BASIC METABOLIC PANEL
ANION GAP: 10 (ref 5–15)
Anion gap: 8 (ref 5–15)
BUN: 8 mg/dL (ref 6–20)
BUN: 8 mg/dL (ref 6–20)
CHLORIDE: 90 mmol/L — AB (ref 101–111)
CHLORIDE: 91 mmol/L — AB (ref 101–111)
CO2: 34 mmol/L — ABNORMAL HIGH (ref 22–32)
CO2: 34 mmol/L — ABNORMAL HIGH (ref 22–32)
Calcium: 8.5 mg/dL — ABNORMAL LOW (ref 8.9–10.3)
Calcium: 8.9 mg/dL (ref 8.9–10.3)
Creatinine, Ser: 0.57 mg/dL — ABNORMAL LOW (ref 0.61–1.24)
Creatinine, Ser: 0.53 mg/dL — ABNORMAL LOW (ref 0.61–1.24)
GFR calc Af Amer: >60 mL/min (ref >60)
GFR calc non Af Amer: >60 mL/min (ref >60)
GFR calc non Af Amer: >60 mL/min (ref >60)
Glucose, Bld: 115 mg/dL — ABNORMAL HIGH (ref 65–99)
Glucose, Bld: 111 mg/dL — ABNORMAL HIGH (ref 65–99)
POTASSIUM: 2.8 mmol/L — AB (ref 3.5–5.1)
POTASSIUM: 3.6 mmol/L (ref 3.5–5.1)
SODIUM: 132 mmol/L — AB (ref 135–145)
SODIUM: 135 mmol/L (ref 135–145)

## 2017-11-29 LAB — CBC
HCT: 27.8 % — ABNORMAL LOW (ref 39.0–52.0)
HEMOGLOBIN: 8.9 g/dL — AB (ref 13.0–17.0)
MCH: 24.8 pg — AB (ref 26.0–34.0)
MCHC: 32 g/dL (ref 30.0–36.0)
MCV: 77.4 fL — ABNORMAL LOW (ref 78.0–100.0)
Platelets: 360 10*3/uL (ref 150–400)
RBC: 3.59 MIL/uL — AB (ref 4.22–5.81)
RDW: 17.5 % — ABNORMAL HIGH (ref 11.5–15.5)
WBC: 13.7 10*3/uL — ABNORMAL HIGH (ref 4.0–10.5)

## 2017-11-29 LAB — BPAM RBC
Blood Product Expiration Date: 201901012359
Blood Product Expiration Date: 201901012359
ISSUE DATE / TIME: 201812160932
ISSUE DATE / TIME: 201812161251
Unit Type and Rh: 5100
Unit Type and Rh: 5100

## 2017-11-29 LAB — GLUCOSE, CAPILLARY
GLUCOSE-CAPILLARY: 96 mg/dL (ref 65–99)
GLUCOSE-CAPILLARY: 111 mg/dL — AB (ref 65–99)
Glucose-Capillary: 116 mg/dL — ABNORMAL HIGH (ref 65–99)
Glucose-Capillary: 107 mg/dL — ABNORMAL HIGH (ref 65–99)
Glucose-Capillary: 101 mg/dL — ABNORMAL HIGH (ref 65–99)

## 2017-11-29 LAB — PROCALCITONIN: Procalcitonin: 0.29 ng/mL

## 2017-11-29 LAB — MAGNESIUM: MAGNESIUM: 2 mg/dL (ref 1.7–2.4)

## 2017-11-29 LAB — PHOSPHORUS: Phosphorus: 1.9 mg/dL — ABNORMAL LOW (ref 2.5–4.6)

## 2017-11-29 MED ORDER — APIXABAN 5 MG PO TABS
5.0000 mg | ORAL_TABLET | Freq: Two times a day (BID) | ORAL | Status: DC
  Administered 2017-11-29: 5 mg via ORAL
  Filled 2017-11-29: qty 1

## 2017-11-29 MED ORDER — POTASSIUM CHLORIDE 10 MEQ/50ML IV SOLN
10.0000 meq | INTRAVENOUS | Status: AC
  Administered 2017-11-29 (×3): 10 meq via INTRAVENOUS
  Filled 2017-11-29 (×3): qty 50

## 2017-11-29 MED ORDER — VANCOMYCIN HCL 10 G IV SOLR
1500.0000 mg | Freq: Two times a day (BID) | INTRAVENOUS | Status: DC
  Administered 2017-11-29 – 2017-12-01 (×5): 1500 mg via INTRAVENOUS
  Filled 2017-11-29 (×6): qty 1500

## 2017-11-29 MED ORDER — PRO-STAT SUGAR FREE PO LIQD
30.0000 mL | Freq: Two times a day (BID) | ORAL | Status: DC
  Administered 2017-11-30 – 2017-12-08 (×13): 30 mL via ORAL
  Filled 2017-11-29 (×13): qty 30

## 2017-11-29 MED ORDER — ATROPINE SULFATE 1 % OP SOLN
1.0000 [drp] | Freq: Four times a day (QID) | OPHTHALMIC | Status: DC | PRN
  Administered 2017-11-29 (×2): 1 [drp] via SUBLINGUAL
  Filled 2017-11-29: qty 2

## 2017-11-29 MED ORDER — SENNOSIDES-DOCUSATE SODIUM 8.6-50 MG PO TABS
1.0000 | ORAL_TABLET | Freq: Every day | ORAL | Status: DC
  Administered 2017-11-29 – 2017-11-30 (×2): 1 via ORAL
  Filled 2017-11-29 (×2): qty 1

## 2017-11-29 MED ORDER — AMIODARONE HCL 200 MG PO TABS
400.0000 mg | ORAL_TABLET | Freq: Two times a day (BID) | ORAL | Status: DC
  Administered 2017-11-29 – 2017-12-02 (×6): 400 mg via ORAL
  Filled 2017-11-29 (×7): qty 2

## 2017-11-29 MED ORDER — POTASSIUM PHOSPHATES 15 MMOLE/5ML IV SOLN
30.0000 mmol | Freq: Once | INTRAVENOUS | Status: AC
  Administered 2017-11-29: 30 mmol via INTRAVENOUS
  Filled 2017-11-29: qty 10

## 2017-11-29 MED ORDER — CHLORHEXIDINE GLUCONATE 0.12 % MT SOLN
OROMUCOSAL | Status: AC
  Filled 2017-11-29: qty 15

## 2017-11-29 NOTE — Progress Notes (Signed)
Patient ID: Levi Brown, male   DOB: [birth year], 61 y.o.   MRN: [id number]   Progress Note  Patient Name: Levi Brown Date of Encounter: 11/29/2017  Primary Cardiologist: Dr Aundra Dubin  Subjective   Intubated; shakes head no to CP or dyspnea  Inpatient Medications    Scheduled Meds: . amiodarone  400 mg Per Tube BID  . apixaban  5 mg Per Tube BID  . chlorhexidine gluconate (MEDLINE KIT)  15 mL Mouth Rinse BID  . Chlorhexidine Gluconate Cloth  6 each Topical Daily  . feeding supplement (PRO-STAT SUGAR FREE 64)  30 mL Per Tube BID  . feeding supplement (VITAL HIGH PROTEIN)  1,000 mL Per Tube Q24H  . Influenza vac split quadrivalent PF  0.5 mL Intramuscular Tomorrow-1000  . ipratropium-albuterol  3 mL Nebulization Q4H  . mouth rinse  15 mL Mouth Rinse QID  . pantoprazole (PROTONIX) IV  40 mg Intravenous Q24H  . senna-docusate  1-2 tablet Per Tube QHS  . sodium chloride flush  10-40 mL Intracatheter Q12H   Continuous Infusions: . sodium chloride    . norepinephrine (LEVOPHED) Adult infusion    . piperacillin-tazobactam (ZOSYN)  IV Stopped (11/29/17 0508)  . potassium PHOSPHATE IVPB (mmol) 30 mmol (11/29/17 0418)  . propofol (DIPRIVAN) infusion Stopped (11/29/17 0800)  . vancomycin Stopped (11/29/17 0900)   PRN Meds: Place/Maintain arterial line **AND** sodium chloride, acetaminophen (TYLENOL) oral liquid 160 mg/5 mL, albuterol, atropine, fentaNYL (SUBLIMAZE) injection, fentaNYL (SUBLIMAZE) injection, midazolam, sodium chloride flush   Vital Signs    Vitals:   11/29/17 0500 11/29/17 0700 11/29/17 0734 11/29/17 0800  BP: '99/80 95/76 95/76 '$ 99/77  Pulse: (!) 107 (!) 106 (!) 106 (!) 117  Resp: (!) 30 (!) 30 (!) 30 (!) 30  Temp: 98.6 F (37 C) 98.8 F (37.1 C) 98.6 F (37 C) 98.4 F (36.9 C)  TempSrc:  Esophageal  Esophageal  SpO2: 100% 99% 100% 98%  Weight:      Height:        Intake/Output Summary (Last 24 hours) at 11/29/2017 0816 Last data filed at 11/29/2017  0800 Gross per 24 hour  Intake 3435.93 ml  Output 2100 ml  Net 1335.93 ml   Filed Weights   11/27/17 0500 11/28/17 0500 11/29/17 0100  Weight: 158 lb 1.1 oz (71.7 kg) 156 lb 8.4 oz (71 kg) 155 lb 3.3 oz (70.4 kg)    Telemetry    Sinus tachycardia - Personally Reviewed   Physical Exam   HEENT: Normal.  General: WD/WN Intubated Neck: No JVD Lungs: Diffuse rhonchi with diminished BS LLL CV: Regular and tachycardic Abdomen: Mildly distended; no masses palpated Neurologic: Moves all ext; responds appropriately to questions by shaking head Extremities: Trace edema    Labs    Chemistry Recent Labs  Lab 12/11/2017 1837 11/27/17 0623 11/28/17 0644 11/29/17 0243  NA  --  131* 132* 132*  K  --  4.0 3.4* 2.8*  CL  --  84* 87* 90*  CO2  --  35* 35* 34*  GLUCOSE  --  139* 111* 115*  BUN  --  5* <5* 8  CREATININE  --  0.47* 0.61 0.57*  CALCIUM  --  8.8* 8.5* 8.5*  ALBUMIN 2.6*  --   --   --   GFRNONAA  --  >60 >60 >60  GFRAA  --  >60 >60 >60  ANIONGAP  --  '12 10 8     '$ Hematology Recent Labs  Lab 11/27/17 [phone]  11/27/17 1001 11/28/17 0644 11/29/17 0243  WBC 11.3*  --  11.4* 13.7*  RBC 3.62* 3.60* 2.72* 3.59*  HGB 9.1*  --  6.7* 8.9*  HCT 30.2*  --  21.7* 27.8*  MCV 83.4  --  79.8 77.4*  MCH 25.1*  --  24.6* 24.8*  MCHC 30.1  --  30.9 32.0  RDW 17.6*  --  17.6* 17.5*  PLT 273  --  396 360    Cardiac Enzymes Recent Labs  Lab 12/08/2017 1837 11/27/17 0004 11/27/17 0623  TROPONINI <0.03 <0.03 <0.03    Recent Labs  Lab 11/27/2017 1223  TROPIPOC 0.00     Radiology    Ct Angio Chest Pe W Or Wo Contrast  Result Date: 11/27/2017 CLINICAL DATA:  Shortness of breath, tachycardia EXAM: CT ANGIOGRAPHY CHEST WITH CONTRAST TECHNIQUE: Multidetector CT imaging of the chest was performed using the standard protocol during bolus administration of intravenous contrast. Multiplanar CT image reconstructions and MIPs were obtained to evaluate the vascular anatomy. CONTRAST:   31m ISOVUE-370 IOPAMIDOL (ISOVUE-370) INJECTION 76% COMPARISON:  11/13/2017 FINDINGS: Cardiovascular: No filling defects in the pulmonary artery is to suggest pulmonary emboli. Right side aortic arch noted. There is a retroesophageal left subclavian artery. Cardiomegaly, small pericardial effusion. 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 Left pulmonary artery is markedly narrowed by the left upper lobe mass, stable. Mediastinum/Nodes: No visible measurable mediastinal, right hilar or axillary adenopathy. Lungs/Pleura: Large anterior left upper lung and mediastinal mass is again noted measuring 9.0 x 6.6 cm compared to 9.0 x 6.2 cm previously, likely not significantly changed. Large left pleural effusion now noted, increased since prior study compressive atelectasis or pneumonia noted in the lingula and left lower lobe with only a small amount of aerated left upper lobe noted. Underlying emphysema. Small right pleural effusion is new since prior study. Upper Abdomen: Imaging into the upper abdomen shows no acute findings. Musculoskeletal: Chest wall soft tissues are unremarkable. No acute bony abnormality. Review of the MIP images confirms the above findings. IMPRESSION: Large left upper lobe mass again noted, invading the mediastinum, unchanged since prior study. There is enlarging large left effusion and new small right effusion. Only a small amount of aerated left upper lobe now present. Airspace disease within the lingula and left lower lobe could reflect atelectasis or pneumonia. Cardiomegaly, small pericardial effusion. Severe attenuation/narrowing of the left upper lobe pulmonary artery by the left upper lobe mass. No evidence of pulmonary embolus. 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 Recommend attention on follow-up imaging. Electronically Signed   By: KRolm BaptiseM.D.   On: 11/27/2017 16:10   Dg Chest Port 1 View  Result  Date: 11/29/2017 CLINICAL DATA:  Respiratory failure, intubated patient. History of CVA, lung malignancy, former smoker. EXAM: PORTABLE CHEST 1 VIEW COMPARISON:  Portable chest x-ray of November 28, 2017 FINDINGS: The right lung is well-expanded. The inflation of the left lung remains decreased due to an elevated hemidiaphragm. There are small bilateral pleural effusions. Interstitial density in the lower 1/2 of the right lung persist but some clearing of the right upper lobe has occurred. There remains soft tissue fullness obscuring the left hilar region. The left heart border is obscured. The endotracheal tube tip lies 3 cm above the carina. The esophagogastric tube tip in proximal port project below the left hemidiaphragm. The right internal jugular venous catheter tip projects over the junction of the proximal and midportions  of the SVC. IMPRESSION: The support tubes are in reasonable position. Mild interval improvement in the appearance of the pulmonary interstitium bilaterally suggests decreasing interstitial edema or less likely pneumonia. Persistent atelectasis or interstitial pneumonia noted at the right lung base. There is chronic elevation of the left hemidiaphragm with atelectasis or infiltrate in the retrocardiac region. Small bilateral pleural effusions. Electronically Signed   By: David  Martinique M.D.   On: 11/29/2017 08:05   Dg Chest Port 1 View  Result Date: 11/28/2017 CLINICAL DATA:  Initial evaluation status post central line placement. EXAM: PORTABLE CHEST 1 VIEW COMPARISON:  Prior radiograph from 11/27/2017. FINDINGS: Patient remains intubated with the tip of the endotracheal tube positioned approximately 2 cm above the carina. Enteric tube courses in the abdomen, side hole beyond the GE junction. There has been interval placement of a right IJ approach centra venous catheter with tip overlying the mid SVC allowing for patient rotation. Defibrillator pads in place. Stable cardiomegaly.  Mediastinal silhouette unchanged. Elevated left hemidiaphragm. Small bilateral pleural effusions. Persistent hazy bibasilar opacities, which may reflect atelectasis or infiltrates. Underlying mild pulmonary interstitial congestion. No pneumothorax. Osseous structures unchanged. IMPRESSION: 1. Interval placement of right IJ approach centra venous catheter with tip overlying the mid SVC. 2. Remaining support apparatus in good position. 3. Persistent elevation of the left hemidiaphragm with small bilateral pleural effusions. Associated bibasilar opacities may reflect atelectasis or infiltrates. 4. Underlying mild pulmonary interstitial congestion. Electronically Signed   By: Jeannine Boga M.D.   On: 11/28/2017 05:36   Dg Chest Port 1 View  Result Date: 11/27/2017 CLINICAL DATA:  ETT placement EXAM: PORTABLE CHEST 1 VIEW COMPARISON:  Chest x-ray November 26, 2017 FINDINGS: The ETT terminates 2.6 cm above the carina in good position. Left chest is partially obscured by a transcutaneous pacer. No pneumothorax. The left hemidiaphragm is elevated. There is a left effusion with underlying opacity. Minimal opacity in the right base is seen as well. No other acute abnormalities. The NG tube terminates in the stomach. IMPRESSION: 1. The support apparatus is in good position. 2. Elevated left hemidiaphragm. There is a left effusion with underlying atelectasis. 3. Mild hazy opacity in the right base. 4. Electronically Signed   By: Dorise Bullion III M.D   On: 11/27/2017 18:06     Patient Profile     Levi Brown is a 62 y.o. male with a hx of Stage IV non-small cell lung cancer with left-sided obstructing mediastinal mass with complete occlusion of left upper lobe bronchus, contralateral paratracheal lymphadenopathy, hypermetabolic metastasis in the sternum, left pleural effusion diagnosed 06/2017, remote strokes unknown to patient seen on MRI, tobacco abuse who is being seen today for the evaluation of rapid  atrial flutter. LV function normal on echo.  Assessment & Plan    1. Atrial flutter - Remains in sinus this AM. Continue amiodarone 400 mg BID for 1 week and then decrease to 200 mg daily thereafter. Continue apixaban.   2. Metastatic lung cancer with recurrent left pleural effusion - CT this admission shows larger left effusion and stable LUL mass.  Pt may require repeat thoracentesis; management per CCM.  3. Acute hypercarbic/hypoxemic respiratory failure - Alert this AM; vent wean per CCM.  4. ID - Suspect post-obstructive PNA.  Continue antibiotics.   5. Hypotension - improved; norepinephrine as needed.  6. Anemia - improved following transfusion  7. Hypokalemia-supplement  For questions or updates, please contact [facility] Please consult [email or web address] for contact info under Cardiology/STEMI.  Signed, Kirk Ruths, MD  11/29/2017, 8:16 AM

## 2017-11-29 NOTE — Progress Notes (Signed)
PULMONARY / CRITICAL CARE MEDICINE   Name: Levi Brown MRN: [id number] DOB: 12-22-1955    ADMISSION DATE:  12/11/2017 CONSULTATION DATE:  11/27/2017  REFERRING MD:  Claudine Mouton  CHIEF COMPLAINT:  Hypercapneic respiratory failure  HISTORY OF PRESENT ILLNESS:   61 yo male former smoker admitted with dyspnea in setting of A flutter.  He was dx with Stage IV NSCLC in July 2018 tx with radiation to Lt mediastinal mass, and was being evaluated to start chemotherapy.  He was seen by oncology on 11/18/17, and at that time was still deciding whether he would want chemotherapy.  He developed worsening respiratory distress 12/15 and required intubation.  He was found to have post-obstructive pneumonia.  SUBJECTIVE:  No events overnight, tolerating PS this AM  VITAL SIGNS: BP 99/77 (BP Location: Left Arm)   Pulse (!) 117   Temp 98.4 F (36.9 C) (Esophageal)   Resp (!) 30   Ht 5\' 6"  (1.676 m)   Wt 70.4 kg (155 lb 3.3 oz)   SpO2 98%   BMI 25.05 kg/m    VENTILATOR SETTINGS: Vent Mode: PSV;CPAP FiO2 (%):  [30 %-40 %] 40 % Set Rate:  [30 bmp] 30 bmp Vt Set:  [400 mL] 400 mL PEEP:  [5 cmH20] 5 cmH20 Pressure Support:  [10 cmH20] 10 cmH20 Plateau Pressure:  [19 cmH20-22 cmH20] 22 cmH20  INTAKE / OUTPUT: I/O last 3 completed shifts: In: 4955.4 [I.V.:1354.1; Blood:785; NG/GT:956.3; IV Piggyback:1860] Out: 2625 [Urine:2625]  PHYSICAL EXAMINATION: General: Chronically ill-appearing male, no acute distress on vent Neuro: Awake and following commands, moving all ext to command HEENT: [place]/AT, PERRL, EOM-I and MMM Cardiovascular: Regular, tachy, Nl S1/S2 and -M/R/G. Lungs: Very little BS on the left, coarse on the right Abdomen: Soft, NT, ND and +BS Musculoskeletal: -edema and -tenderness Skin: Intact  LABS:  BMET Recent Labs  Lab 11/27/17 0623 11/28/17 0644 11/29/17 0243  NA 131* 132* 132*  K 4.0 3.4* 2.8*  CL 84* 87* 90*  CO2 35* 35* 34*  BUN 5* <5* 8  CREATININE 0.47* 0.61  0.57*  GLUCOSE 139* 111* 115*   Electrolytes Recent Labs  Lab 11/27/17 0623 11/28/17 0644 11/28/17 1700 11/29/17 0243  CALCIUM 8.8* 8.5*  --  8.5*  MG  --  1.5* 1.5* 2.0  PHOS  --  2.1* 2.4* 1.9*   CBC Recent Labs  Lab 11/27/17 0623 11/28/17 0644 11/29/17 0243  WBC 11.3* 11.4* 13.7*  HGB 9.1* 6.7* 8.9*  HCT 30.2* 21.7* 27.8*  PLT 273 396 360   Coag's No results for input(s): APTT, INR in the last 168 hours.  Sepsis Markers Recent Labs  Lab 11/28/17 0644 11/29/17 0243  PROCALCITON 0.37 0.29   ABG Recent Labs  Lab 11/27/17 1653 11/27/17 1814 11/27/17 2010  PHART 7.148* 7.285* 7.462*  PCO2ART ABOVE REPORTABLE RANGE,RBV C.SMALLWOOD BY A.MCKINNEY AT 4401 ON 11/27/2017 88.1* 55.0*  PO2ART 291.00* 405.0* 140.0*   Liver Enzymes Recent Labs  Lab 12/02/2017 1837  ALBUMIN 2.6*   Cardiac Enzymes Recent Labs  Lab 11/23/2017 1837 11/27/17 0004 11/27/17 0623  TROPONINI <0.03 <0.03 <0.03   Glucose Recent Labs  Lab 11/28/17 1307 11/28/17 1619 11/28/17 1923 11/28/17 2319 11/29/17 0335 11/29/17 0727  GLUCAP 115* 134* 114* 90 96 116*   Imaging Dg Chest Port 1 View  Result Date: 11/29/2017 CLINICAL DATA:  Respiratory failure, intubated patient. History of CVA, lung malignancy, former smoker. EXAM: PORTABLE CHEST 1 VIEW COMPARISON:  Portable chest x-ray of November 28, 2017 FINDINGS:  The right lung is well-expanded. The inflation of the left lung remains decreased due to an elevated hemidiaphragm. There are small bilateral pleural effusions. Interstitial density in the lower 1/2 of the right lung persist but some clearing of the right upper lobe has occurred. There remains soft tissue fullness obscuring the left hilar region. The left heart border is obscured. The endotracheal tube tip lies 3 cm above the carina. The esophagogastric tube tip in proximal port project below the left hemidiaphragm. The right internal jugular venous catheter tip projects over the junction  of the proximal and midportions of the SVC. IMPRESSION: The support tubes are in reasonable position. Mild interval improvement in the appearance of the pulmonary interstitium bilaterally suggests decreasing interstitial edema or less likely pneumonia. Persistent atelectasis or interstitial pneumonia noted at the right lung base. There is chronic elevation of the left hemidiaphragm with atelectasis or infiltrate in the retrocardiac region. Small bilateral pleural effusions. Electronically Signed   By: David  Martinique M.D.   On: 11/29/2017 08:05   STUDIES:  CTA chest 12/15 > >>No PE, stable LUL mass of LUL pulmonary artery, possible lingular/left lower lobe pneumonia, large left pleural effusion  CULTURES: Blood cultures x2 12/15 >> Tracheal aspirate 12/15 >> Urine 12/15 >>  ANTIBIOTICS: vanc 12/15>>> Zosyn 12/15 >>  SIGNIFICANT EVENTS: 12/15 Acute hypercapneic respiratory failure  LINES/TUBES: ETT 12/15>>> R IJ CVL 12/15>>>  DISCUSSION: 61 year old male with history of stage IV non-small cell lung cancer with left-sided obstructing mediastinal mass admitted 12/15 with acute respiratory failure related to postobstructive pneumonia and atrial flutter.  ASSESSMENT / PLAN:  PULMONARY A: Acute hypoxic/hypercapnic respiratory failure from post obstructive pneumonia, pleural effusion. P:   PS and Trial extubation today CXR and ABG as PRN at this point Antibiotics as above Daily SBT May need to consider thoracentesis at some point -we will hold off for now ?pleurex catheter placement Thora once exubated  CARDIOVASCULAR A:  Converted atrial flutter on amiodarone Hypotension P:  Currently in normal sinus rhythm on amiodarone and Eliquis Continue anticoagulation Cardiology following, appreciate input Monitor CVP Levophed if needed - currently maintaining MAP without pressors  RENAL A:   Low K and Phos P: Replace electrolytes as indicated BMET in AM Recheck BMET and Phos this  afternoon  GASTROINTESTINAL A:   Nutrition P:   Tube feeding per nutrition PPI  HEMATOLOGIC A:   Anemia-normocytic-likely related to underlying malignancy P:  Follow-up CBC Continue Eliquis and monitor  INFECTIOUS A:   Post-obstructive HCAP P:   Continue Vanco and Zosyn as above Follow culture data, none available at this point Diagnostic evaluation of pleural fluid if undergoes thoracentesis  ENDOCRINE A:   Borderline elevated TSH with nl free T4 P:   Primary service had planned to repeat in 6 weeks as OPD. No intervention warranted at present Monitor glucose on chemistry  NEUROLOGIC A:   Sedated post intubation P:   RASS goal: -1 Hold propofol for weaning  FAMILY  - Updates: Patient's sister updated at length bedside, full code status confirmed.    The patient is critically ill with multiple organ systems failure and requires high complexity decision making for assessment and support, frequent evaluation and titration of therapies, application of advanced monitoring technologies and extensive interpretation of multiple databases.   Critical Care Time devoted to patient care services described in this note is  35  Minutes. This time reflects time of care of this signee Dr Jennet Maduro. This critical care time does not reflect procedure  time, or teaching time or supervisory time of PA/NP/Med student/Med Resident etc but could involve care discussion time.  Rush Farmer, M.D. [facility] Pulmonary/Critical Care Medicine. Pager: [phone]. After hours pager: [phone].

## 2017-11-29 NOTE — Care Management Note (Addendum)
Case Management Note  Patient Details  Name: Levi Brown MRN: [id number] Date of Birth: 11-22-1956  Subjective/Objective:   From home with his uncle and his sister, presents with  Aflutter,hypercapneic resp failure , post obstructive pna, metastatic lung cancer with recurrent pl effusion.  On vent will try to extubate today per MD note.   He has home oxygen with [employer] (3-4 liters) as needed.  He has PCP and medication coverage with Medicaid.                Action/Plan: NCM will follow for dc needs.   Expected Discharge Date:                  Expected Discharge Plan:     In-House Referral:     Discharge planning Services  CM Consult  Post Acute Care Choice:    Choice offered to:     DME Arranged:    DME Agency:     HH Arranged:    HH Agency:     Status of Service:  In process, will continue to follow  If discussed at Long Length of Stay Meetings, dates discussed:    Additional Comments:  [name], RN 11/29/2017, 9:41 AM

## 2017-11-29 NOTE — Procedures (Signed)
Extubation Procedure Note  Patient Details:   Name: Levi Brown DOB: 06-Mar-1956 MRN: [id number]   Airway Documentation:  MD order for extubation. Patient has positive cuff leak. Patient extubated without incident and has strong cough. Patient able to voice his name and location. Patient in no apparent distress at this time. Placed on 4lpm humidified oxygen. RN at bedside at this time.    Evaluation  O2 sats: stable throughout Complications: No apparent complications Patient did tolerate procedure well. Bilateral Breath Sounds: Diminished, Coarse crackles   Yes  Ander Purpura 11/29/2017, 9:34 AM

## 2017-11-29 NOTE — Progress Notes (Signed)
[facility] Progress Note Patient Name: BRAEDEN KENNAN DOB: 03-15-1956 MRN: [id number]   Date of Service  11/29/2017  HPI/Events of Note  K+ = 2.8, PO4--- = 1.9 and Creatinine = 0.57.  eICU Interventions  Will replace K+ and PO4---.     Intervention Category Major Interventions: Electrolyte abnormality - evaluation and management  Gerrianne Aydelott Eugene 11/29/2017, 3:57 AM

## 2017-11-29 NOTE — Progress Notes (Addendum)
Pharmacy Antibiotic Note  Levi Brown is a 61 y.o. male admitted on 12/01/2017 with pneumonia.  Pharmacy has been consulted for Vancomycin/Zosyn  dosing.  Day # 3 of vancomycin therapy Vancomycin trough low at 9 mcg / dL MRSA PCR negative Trach aspirate with GPC in chains  Plan: Increase Vancomycin to 1500 mg iv Q 12 hours - consider stopping soon? Continue Zosyn 3.375 grams iv Q 8 hours  Height: 5\' 6"  (167.6 cm) Weight: 155 lb 3.3 oz (70.4 kg) IBW/kg (Calculated) : 63.8  Temp (24hrs), Avg:98.7 F (37.1 C), Min:97.9 F (36.6 C), Max:99.7 F (37.6 C)  Recent Labs  Lab 11/17/2017 1205 11/24/2017 1225 11/27/17 0623 11/28/17 0644 11/29/17 0243 11/29/17 1132 11/29/17 1930  WBC 11.3*  --  11.3* 11.4* 13.7*  --   --   CREATININE 0.43* 0.50* 0.47* 0.61 0.57* 0.53*  --   VANCOTROUGH  --   --   --   --   --   --  9*    Estimated Creatinine Clearance: 88.6 mL/min (A) (by C-G formula based on SCr of 0.53 mg/dL (L)).    No Known Allergies   Thank you for allowing pharmacy to be a part of this patient's care. Anette Guarneri, PharmD [phone] 11/29/2017 9:24 PM

## 2017-11-29 NOTE — Progress Notes (Signed)
CRITICAL VALUE ALERT  Critical Value:  K 2.8  Date & Time Notied:  11/29/17 3:53 AM   Provider Notified: Dr. Oletta Darter  Orders Received/Actions taken: KCl replacement via CVC

## 2017-11-29 NOTE — Progress Notes (Signed)
[facility] Progress Note Patient Name: Levi Brown DOB: [birth year] MRN: [id number]   Date of Service  11/29/2017  HPI/Events of Note  Copious oral secretions.   eICU Interventions  Will order: 1. Atropine solution 1 drop SL Q 6 hours PRN copious oral secretions.      Intervention Category Major Interventions: Other:  Sommer,Steven Cornelia Copa 11/29/2017, 1:18 AM

## 2017-11-29 NOTE — Progress Notes (Signed)
Patient instructed on incentive spirometry with teach back. Patient achieved goal of 500 ml x10. Will continue to monitor use.

## 2017-11-29 NOTE — Progress Notes (Signed)
FPTS Interim Progress Note  S: Patient was awake and alert in the room this morning having just been exubated about 5 mins ago according to nurse in the room. He states he was feeling much better but was still struggling to breath.  O: Cardio: reg rhythm, tachycardia, no murmurs       Resp: Increased work of breathing, decreased breath sounds bilaterally worse on the left than on the right with coarse sounds on the right BP (!) 148/108   Pulse (!) 124   Temp 98.2 F (36.8 C)   Resp (!) 26   Ht 5\' 6"  (1.676 m)   Wt 155 lb 3.3 oz (70.4 kg)   SpO2 100%   BMI 25.05 kg/m     A/P: Appreciate Critical Care and Cardiology great care of patient. Will resume primary follow up care tomorrow. Will follow labs, respiratory status. Consider palliative consult.  Nuala Alpha, DO 11/29/2017, 9:37 AM PGY-1, [place] Medicine Service pager [phone]

## 2017-11-30 DIAGNOSIS — Z515 Encounter for palliative care: Secondary | ICD-10-CM

## 2017-11-30 DIAGNOSIS — J90 Pleural effusion, not elsewhere classified: Secondary | ICD-10-CM

## 2017-11-30 LAB — BASIC METABOLIC PANEL
Anion gap: 8 (ref 5–15)
BUN: 5 mg/dL — ABNORMAL LOW (ref 6–20)
CALCIUM: 8.9 mg/dL (ref 8.9–10.3)
CO2: 36 mmol/L — ABNORMAL HIGH (ref 22–32)
CREATININE: 0.59 mg/dL — AB (ref 0.61–1.24)
Chloride: 92 mmol/L — ABNORMAL LOW (ref 101–111)
GFR calc non Af Amer: >60 mL/min (ref >60)
Glucose, Bld: 127 mg/dL — ABNORMAL HIGH (ref 65–99)
Potassium: 3.4 mmol/L — ABNORMAL LOW (ref 3.5–5.1)
SODIUM: 136 mmol/L (ref 135–145)

## 2017-11-30 LAB — CULTURE, RESPIRATORY: SPECIAL REQUESTS: NORMAL

## 2017-11-30 LAB — GLUCOSE, CAPILLARY
GLUCOSE-CAPILLARY: 108 mg/dL — AB (ref 65–99)
Glucose-Capillary: 119 mg/dL — ABNORMAL HIGH (ref 65–99)
Glucose-Capillary: 101 mg/dL — ABNORMAL HIGH (ref 65–99)
Glucose-Capillary: 106 mg/dL — ABNORMAL HIGH (ref 65–99)

## 2017-11-30 LAB — CBC
HCT: 28.9 % — ABNORMAL LOW (ref 39.0–52.0)
Hemoglobin: 9.1 g/dL — ABNORMAL LOW (ref 13.0–17.0)
MCH: 25.2 pg — ABNORMAL LOW (ref 26.0–34.0)
MCHC: 31.5 g/dL (ref 30.0–36.0)
MCV: 80.1 fL (ref 78.0–100.0)
PLATELETS: 332 10*3/uL (ref 150–400)
RBC: 3.61 MIL/uL — ABNORMAL LOW (ref 4.22–5.81)
RDW: 17.4 % — AB (ref 11.5–15.5)
WBC: 17.7 10*3/uL — ABNORMAL HIGH (ref 4.0–10.5)

## 2017-11-30 LAB — PROCALCITONIN: PROCALCITONIN: 0.18 ng/mL

## 2017-11-30 LAB — PHOSPHORUS
PHOSPHORUS: 4.8 mg/dL — AB (ref 2.5–4.6)
Phosphorus: 3.7 mg/dL (ref 2.5–4.6)

## 2017-11-30 LAB — CULTURE, RESPIRATORY W GRAM STAIN: Culture: NORMAL

## 2017-11-30 LAB — MAGNESIUM: MAGNESIUM: 2 mg/dL (ref 1.7–2.4)

## 2017-11-30 MED ORDER — POTASSIUM CHLORIDE CRYS ER 20 MEQ PO TBCR
40.0000 meq | EXTENDED_RELEASE_TABLET | Freq: Three times a day (TID) | ORAL | Status: AC
  Administered 2017-11-30 (×2): 40 meq via ORAL
  Filled 2017-11-30 (×2): qty 2

## 2017-11-30 MED ORDER — ALPRAZOLAM 0.25 MG PO TABS
0.2500 mg | ORAL_TABLET | Freq: Three times a day (TID) | ORAL | Status: DC | PRN
  Administered 2017-11-30 (×2): 0.25 mg via ORAL
  Filled 2017-11-30 (×2): qty 1

## 2017-11-30 MED ORDER — DOCUSATE SODIUM 100 MG PO CAPS
200.0000 mg | ORAL_CAPSULE | Freq: Every day | ORAL | Status: DC
  Filled 2017-11-30 (×2): qty 2

## 2017-11-30 MED ORDER — FUROSEMIDE 10 MG/ML IJ SOLN
40.0000 mg | Freq: Once | INTRAMUSCULAR | Status: AC
  Administered 2017-11-30: 40 mg via INTRAVENOUS
  Filled 2017-11-30: qty 4

## 2017-11-30 MED ORDER — POTASSIUM CHLORIDE 10 MEQ/50ML IV SOLN
10.0000 meq | INTRAVENOUS | Status: AC
  Administered 2017-11-30 (×4): 10 meq via INTRAVENOUS
  Filled 2017-11-30 (×4): qty 50

## 2017-11-30 MED ORDER — BISACODYL 5 MG PO TBEC
10.0000 mg | DELAYED_RELEASE_TABLET | Freq: Every day | ORAL | Status: DC | PRN
  Filled 2017-11-30: qty 2

## 2017-11-30 MED ORDER — ZOLPIDEM TARTRATE 5 MG PO TABS
5.0000 mg | ORAL_TABLET | Freq: Every evening | ORAL | Status: DC | PRN
  Administered 2017-11-30 – 2017-12-03 (×2): 5 mg via ORAL
  Filled 2017-11-30 (×2): qty 1

## 2017-11-30 NOTE — Progress Notes (Signed)
[place] [facility] Progress Note Patient Name: Levi Brown DOB: [birth year] MRN: [id number]   Date of Service  11/30/2017  HPI/Events of Note  K+ = 3.4 and Creatinine = 0.59.   eICU Interventions  Will replace K+.      Intervention Category Major Interventions: Electrolyte abnormality - evaluation and management  Sommer,Steven Eugene 11/30/2017, 5:43 AM

## 2017-11-30 NOTE — Progress Notes (Signed)
Patient upset he could not have thoracentesis procedure completed. Made statement "I don't want to do this anymore, I'm ready to give up."  States he did not have any children, never married and worked in [employer] all his life. He says he was never sick up until he developed SOB earlier this year that resulted in lung cancer diagnosis. Has received radiation treatments and was supposed to start chemotherapy in January 2019. Spoke to patient regarding his feelings. Talked to him about being seen by our Palliative Care team and is agreeable. Zara Council, NP-CCM made aware.

## 2017-11-30 NOTE — Progress Notes (Signed)
PULMONARY / CRITICAL CARE MEDICINE   Name: Levi Brown MRN: [id number] DOB: [birth year]    ADMISSION DATE:  11/27/2017 CONSULTATION DATE:  11/27/2017  REFERRING MD:  Claudine Mouton  CHIEF COMPLAINT:  Hypercapneic respiratory failure  HISTORY OF PRESENT ILLNESS:   61 yo male former smoker admitted with dyspnea in setting of A flutter.  He was dx with Stage IV NSCLC in July 2018 tx with radiation to Lt mediastinal mass, and was being evaluated to start chemotherapy.  He was seen by oncology on 11/18/17, and at that time was still deciding whether he would want chemotherapy.  He developed worsening respiratory distress 12/15 and required intubation.  He was found to have post-obstructive pneumonia.  SUBJECTIVE:  No events overnight, extubated but unable to speak in full sentences due to SOB  VITAL SIGNS: BP 111/84   Pulse (!) 107   Temp (!) 97.4 F (36.3 C) (Oral)   Resp (!) 25   Ht 5\' 6"  (1.676 m)   Wt 72.6 kg (160 lb)   SpO2 97%   BMI 25.82 kg/m   VENTILATOR SETTINGS: Vent Mode: PSV;CPAP FiO2 (%):  [36 %-40 %] 36 % PEEP:  [5 cmH20] 5 cmH20 Pressure Support:  [10 cmH20] 10 cmH20  INTAKE / OUTPUT: I/O last 3 completed shifts: In: 3487.7 [P.O.:720; I.V.:167.7; NG/GT:790; IV Piggyback:1810] Out: 3850 [Urine:3850]  PHYSICAL EXAMINATION: General: Chronically ill-appearing male, significant respiratory distress, unable to speak in full sentences Neuro: Alert and interactive, moving all ext to command HEENT: [place]/AT, PERRL, EOM-I and MMM Cardiovascular: RRR, Nl S1/S2, -M/R/G. Lungs: Minimal BS on the left, clear on the right Abdomen: Soft, NT, ND and +BS Musculoskeletal: -edema and -tenderness Skin: Intact  LABS:  BMET Recent Labs  Lab 11/29/17 0243 11/29/17 1132 11/30/17 0353  NA 132* 135 136  K 2.8* 3.6 3.4*  CL 90* 91* 92*  CO2 34* 34* 36*  BUN 8 8 5*  CREATININE 0.57* 0.53* 0.59*  GLUCOSE 115* 111* 127*   Electrolytes Recent Labs  Lab 11/28/17 1700  11/29/17 0243 11/29/17 1132 11/30/17 0353  CALCIUM  --  8.5* 8.9 8.9  MG 1.5* 2.0  --  2.0  PHOS 2.4* 1.9*  --  4.8*   CBC Recent Labs  Lab 11/28/17 0644 11/29/17 0243 11/30/17 0353  WBC 11.4* 13.7* 17.7*  HGB 6.7* 8.9* 9.1*  HCT 21.7* 27.8* 28.9*  PLT 396 360 332   Coag's No results for input(s): APTT, INR in the last 168 hours.  Sepsis Markers Recent Labs  Lab 11/28/17 0644 11/29/17 0243 11/30/17 0353  PROCALCITON 0.37 0.29 0.18   ABG Recent Labs  Lab 11/27/17 1653 11/27/17 1814 11/27/17 2010  PHART 7.148* 7.285* 7.462*  PCO2ART ABOVE REPORTABLE RANGE,RBV C.SMALLWOOD BY A.MCKINNEY AT 1656 ON 11/27/2017 88.1* 55.0*  PO2ART 291.00* 405.0* 140.0*   Liver Enzymes Recent Labs  Lab 12/01/2017 1837  ALBUMIN 2.6*   Cardiac Enzymes Recent Labs  Lab 11/22/2017 1837 11/27/17 0004 11/27/17 0623  TROPONINI <0.03 <0.03 <0.03   Glucose Recent Labs  Lab 11/29/17 0727 11/29/17 1138 11/29/17 1515 11/29/17 2004 11/30/17 0013 11/30/17 0351  GLUCAP 116* 111* 107* 101* 101* 119*   Imaging No results found. STUDIES:  CTA chest 12/15 > >>No PE, stable LUL mass of LUL pulmonary artery, possible lingular/left lower lobe pneumonia, large left pleural effusion  CULTURES: Blood cultures x2 12/15 >> Tracheal aspirate 12/15 >> Urine 12/15 >>  ANTIBIOTICS: vanc 12/15>>> Zosyn 12/15 >>  SIGNIFICANT EVENTS: 12/15 Acute hypercapneic respiratory failure  LINES/TUBES: ETT 12/15>>> R IJ CVL 12/15>>>  DISCUSSION: 61 year old male with history of stage IV non-small cell lung cancer with left-sided obstructing mediastinal mass admitted 12/15 with acute respiratory failure related to postobstructive pneumonia and atrial flutter.  ASSESSMENT / PLAN:  PULMONARY A: Acute hypoxic/hypercapnic respiratory failure from post obstructive pneumonia, pleural effusion. P:   Hold in ICU given severity of respiratory distress, unable to speak but in 1 to 2 word sentences  Hold  eliquis tonight for a thora tomorrow Towards the afternoon if remains in this level of respiratory distress then may need to do the thora anyway Lasix x1 dose now with some K Antibiotics as above ?pleurex catheter placement if fluid reaccumulates and the lung is not trapped   CARDIOVASCULAR A:  Converted atrial flutter on amiodarone Hypotension P:  Currently in normal sinus rhythm on amiodarone and Eliquis, will hold tonights dose and restart after thora tomorrow night (was on eliquis 5 BID) Continue anticoagulation with plan as above Cardiology following, appreciate input Monitor CVP Tele monitoring  RENAL A:   Low K and Phos P: Replace electrolytes as indicated BMET in AM Recheck BMET and Phos this afternoon Lasix 40 mg IV x1 K-dur 40 meq PO q8 x2 doses  GASTROINTESTINAL A:   Nutrition P:   Diet as ordered PPI  HEMATOLOGIC A:   Anemia-normocytic-likely related to underlying malignancy P:  Follow-up CBC Plan for anti-coagulation as above  INFECTIOUS A:   Post-obstructive HCAP P:   Continue Vanco and Zosyn as above Follow culture data, none available at this point Diagnostic evaluation of pleural fluid if undergoes thoracentesis  ENDOCRINE A:   Borderline elevated TSH with nl free T4 P:   Primary service had planned to repeat in 6 weeks as OPD. No intervention warranted at present Monitor glucose on chemistry  NEUROLOGIC A:   Sedated post intubation P:   Minimize sedation  FAMILY  - Updates: Spoke with patient at length, after discussion, LCB with no CPR/cardioversion, short term intubation only, no trach.  The patient is critically ill with multiple organ systems failure and requires high complexity decision making for assessment and support, frequent evaluation and titration of therapies, application of advanced monitoring technologies and extensive interpretation of multiple databases.   Critical Care Time devoted to patient care services  described in this note is  35  Minutes. This time reflects time of care of this signee Dr Jennet Maduro. This critical care time does not reflect procedure time, or teaching time or supervisory time of PA/NP/Med student/Med Resident etc but could involve care discussion time.  Rush Farmer, M.D. [facility] Pulmonary/Critical Care Medicine. Pager: [phone]. After hours pager: [phone].

## 2017-11-30 NOTE — Evaluation (Signed)
Physical Therapy Evaluation Patient Details Name: Levi Brown MRN: [id number] DOB: 08/12/1956 Today's Date: 11/30/2017   History of Present Illness  Pt is a 61 y.o. male admitted 11/23/2017 with dyspnea in setting of A flutter. Required intubation 12/15-17; found to have post-obstructive pneumonia. 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     Clinical Impression  Pt presents with decreased activity tolerance and an overall decrease in functional mobility secondary to above. PTA, pt indep with mobility; lives with uncle who is available for 24/7 support. Today, pt able to ambulate 390' with eva walker, progressing from min guard to supervision for safety; required multiple standing rest breaks secondary to fatigue and SOB. SpO2 down to 84% on 4L O2 [place] post-amb, returning to 95% with seated rest break and deep breathing. Pt would benefit from continued acute PT services to maximize functional mobility and independence prior to d/c with HHPT.     Follow Up Recommendations Home health PT    Equipment Recommendations  Other (comment);Rolling walker with 5" wheels(TBD- may progress away from RW)    Recommendations for Other Services OT consult     Precautions / Restrictions Precautions Precautions: Fall Restrictions Weight Bearing Restrictions: No      Mobility  Bed Mobility               General bed mobility comments: Received sitting in chair  Transfers Overall transfer level: Needs assistance Equipment used: 4-wheeled walker(Eva walker) Transfers: Sit to/from Stand Sit to Stand: Min guard         General transfer comment: Min guard for balance, no physical assist required  Ambulation/Gait Ambulation/Gait assistance: Supervision;Min guard Ambulation Distance (Feet): 390 Feet Assistive device: 4-wheeled walker(Eva walker) Gait Pattern/deviations: Step-through pattern;Decreased stride length;Trunk  flexed Gait velocity: Decreased Gait velocity interpretation: <1.8 ft/sec, indicative of risk for recurrent falls General Gait Details: Slow, controlled amb with eva walker; min guard for balance, progressing to supervision. 2x standing rest breaks secondary to increased SOB. Unable to get reading on pulse ox, but SpO2 down to 84% on 4L O2 [place] upon return to room; >90% with seated rest break and deep breathing  Stairs            Wheelchair Mobility    Modified Rankin (Stroke Patients Only)       Balance Overall balance assessment: Needs assistance   Sitting balance-Leahy Scale: Good       Standing balance-Leahy Scale: Fair Standing balance comment: Can static stand with no UE support                             Pertinent Vitals/Pain Pain Assessment: No/denies pain    Home Living Family/patient expects to be discharged to:: Private residence Living Arrangements: Other relatives(Uncle) Available Help at Discharge: Family;Available 24 hours/day Type of Home: House Home Access: Level entry     Home Layout: One level Home Equipment: None      Prior Function Level of Independence: Independent               Hand Dominance        Extremity/Trunk Assessment   Upper Extremity Assessment Upper Extremity Assessment: Generalized weakness    Lower Extremity Assessment Lower Extremity Assessment: Generalized weakness       Communication   Communication: Other (comment)(Soft, SOB)  Cognition Arousal/Alertness: Awake/alert Behavior During Therapy: Flat affect Overall Cognitive Status: Within Functional Limits  for tasks assessed                                        General Comments General comments (skin integrity, edema, etc.): HR 100s at rest, up to 123 with amb    Exercises     Assessment/Plan    PT Assessment Patient needs continued PT services  PT Problem List Decreased strength;Decreased activity  tolerance;Decreased balance;Decreased mobility;Decreased knowledge of use of DME       PT Treatment Interventions DME instruction;Gait training;Stair training;Functional mobility training;Therapeutic activities;Therapeutic exercise;Balance training;Patient/family education    PT Goals (Current goals can be found in the Care Plan section)  Acute Rehab PT Goals Patient Stated Goal: Better breathing PT Goal Formulation: With patient Time For Goal Achievement: 12/14/17 Potential to Achieve Goals: Good    Frequency Min 3X/week   Barriers to discharge        Co-evaluation               AM-PAC PT "6 Clicks" Daily Activity  Outcome Measure Difficulty turning over in bed (including adjusting bedclothes, sheets and blankets)?: A Little Difficulty moving from lying on back to sitting on the side of the bed? : A Little Difficulty sitting down on and standing up from a chair with arms (e.g., wheelchair, bedside commode, etc,.)?: A Little Help needed moving to and from a bed to chair (including a wheelchair)?: A Little Help needed walking in hospital room?: A Little Help needed climbing 3-5 steps with a railing? : A Little 6 Click Score: 18    End of Session Equipment Utilized During Treatment: Gait belt;Oxygen Activity Tolerance: Patient tolerated treatment well Patient left: in chair;with call bell/phone within reach Nurse Communication: Mobility status PT Visit Diagnosis: Other abnormalities of gait and mobility (R26.89);Muscle weakness (generalized) (M62.81)    Time: 9150-5697 PT Time Calculation (min) (ACUTE ONLY): 23 min   Charges:   PT Evaluation $PT Eval Moderate Complexity: 1 Mod PT Treatments $Gait Training: 8-22 mins   PT G Codes:       Mabeline Caras, PT, DPT Acute Rehab Services  Pager: [place] 11/30/2017, 9:06 AM

## 2017-11-30 NOTE — Progress Notes (Signed)
FPTS Interim Progress Note  S:Patient today awake and sitting in chair. Patient is being visited by his sister. States he is having some SOB. I informed nurse and she came in to assess patient.   O: BP 107/85   Pulse (!) 110   Temp (!) 96.4 F (35.8 C)   Resp (!) 22   Ht 5\' 6"  (1.676 m)   Wt 160 lb (72.6 kg)   SpO2 100%   BMI 25.82 kg/m     A/P: Appreciate Critical Care and Cardiology great care of patient. Are happy to resume care when medically stable to be transferred out of ICU.    Caroline More, DO 11/30/2017, 1:33 PM PGY-1, [place] Medicine Service pager [phone]

## 2017-11-30 NOTE — Progress Notes (Signed)
ABG ran on ISTAT. ABG PH 7.11, CO2 >91, PAO2 78. Results will not cross over because CO2 is above reportable range. RN aware

## 2017-11-30 NOTE — Progress Notes (Signed)
Palliative Medicine consult noted. Due to high referral volume, there may be a delay seeing this patient. Please call the Palliative Medicine Team office at [phone] if recommendations are needed in the interim.  Thank you for inviting [place] to see this patient.  [name] Decarlo Rivet, RN, BSN, [facility] 11/30/2017 2:31 PM Cell [phone] 8:00-4:00 Monday-Friday Office [phone]

## 2017-12-01 ENCOUNTER — Inpatient Hospital Stay (HOSPITAL_COMMUNITY): Payer: Medicaid Other

## 2017-12-01 DIAGNOSIS — J9620 Acute and chronic respiratory failure, unspecified whether with hypoxia or hypercapnia: Secondary | ICD-10-CM

## 2017-12-01 LAB — CBC
HEMATOCRIT: 30.8 % — AB (ref 39.0–52.0)
Hemoglobin: 9.2 g/dL — ABNORMAL LOW (ref 13.0–17.0)
MCH: 25.1 pg — ABNORMAL LOW (ref 26.0–34.0)
MCHC: 29.9 g/dL — ABNORMAL LOW (ref 30.0–36.0)
MCV: 84.2 fL (ref 78.0–100.0)
PLATELETS: 322 10*3/uL (ref 150–400)
RBC: 3.66 MIL/uL — AB (ref 4.22–5.81)
RDW: 17.5 % — AB (ref 11.5–15.5)
WBC: 21.6 10*3/uL — AB (ref 4.0–10.5)

## 2017-12-01 LAB — POCT I-STAT 3, ART BLOOD GAS (G3+)
ACID-BASE EXCESS: 8 mmol/L — AB (ref 0.0–2.0)
Bicarbonate: 37.6 mmol/L — ABNORMAL HIGH (ref 20.0–28.0)
O2 SAT: 99 %
PH ART: 7.267 — AB (ref 7.350–7.450)
TCO2: 40 mmol/L — AB (ref 22–32)
pCO2 arterial: 81.6 mmHg (ref 32.0–48.0)
pO2, Arterial: 140 mmHg — ABNORMAL HIGH (ref 83.0–108.0)

## 2017-12-01 LAB — BASIC METABOLIC PANEL
ANION GAP: 6 (ref 5–15)
BUN: 11 mg/dL (ref 6–20)
CALCIUM: 9 mg/dL (ref 8.9–10.3)
CO2: 36 mmol/L — ABNORMAL HIGH (ref 22–32)
Chloride: 90 mmol/L — ABNORMAL LOW (ref 101–111)
Creatinine, Ser: 0.63 mg/dL (ref 0.61–1.24)
Glucose, Bld: 121 mg/dL — ABNORMAL HIGH (ref 65–99)
POTASSIUM: 4.7 mmol/L (ref 3.5–5.1)
Sodium: 132 mmol/L — ABNORMAL LOW (ref 135–145)

## 2017-12-01 LAB — PHOSPHORUS: PHOSPHORUS: 4.2 mg/dL (ref 2.5–4.6)

## 2017-12-01 LAB — MAGNESIUM: Magnesium: 1.9 mg/dL (ref 1.7–2.4)

## 2017-12-01 MED ORDER — MAGNESIUM SULFATE 2 GM/50ML IV SOLN
2.0000 g | Freq: Once | INTRAVENOUS | Status: AC
  Administered 2017-12-01: 2 g via INTRAVENOUS
  Filled 2017-12-01: qty 50

## 2017-12-01 MED ORDER — MORPHINE SULFATE (PF) 4 MG/ML IV SOLN
2.0000 mg | INTRAVENOUS | Status: DC | PRN
  Administered 2017-12-01: 4 mg via INTRAVENOUS
  Administered 2017-12-06 – 2017-12-08 (×3): 2 mg via INTRAVENOUS
  Administered 2017-12-08 – 2017-12-09 (×3): 4 mg via INTRAVENOUS
  Filled 2017-12-01 (×7): qty 1

## 2017-12-01 MED ORDER — SENNOSIDES-DOCUSATE SODIUM 8.6-50 MG PO TABS
1.0000 | ORAL_TABLET | Freq: Two times a day (BID) | ORAL | Status: DC | PRN
  Administered 2017-12-01: 2 via ORAL
  Filled 2017-12-01 (×2): qty 2

## 2017-12-01 MED ORDER — LORAZEPAM 2 MG/ML IJ SOLN
1.0000 mg | INTRAMUSCULAR | Status: DC | PRN
  Administered 2017-12-01 – 2017-12-04 (×2): 1 mg via INTRAVENOUS
  Filled 2017-12-01 (×2): qty 1

## 2017-12-01 MED ORDER — CHLORHEXIDINE GLUCONATE 0.12 % MT SOLN
15.0000 mL | Freq: Two times a day (BID) | OROMUCOSAL | Status: DC
  Administered 2017-12-01 – 2017-12-09 (×15): 15 mL via OROMUCOSAL
  Filled 2017-12-01 (×11): qty 15

## 2017-12-01 MED ORDER — FUROSEMIDE 10 MG/ML IJ SOLN
40.0000 mg | Freq: Three times a day (TID) | INTRAMUSCULAR | Status: AC
  Administered 2017-12-01 (×2): 40 mg via INTRAVENOUS
  Filled 2017-12-01 (×2): qty 4

## 2017-12-01 MED ORDER — GLYCOPYRROLATE 0.2 MG/ML IJ SOLN
0.2000 mg | INTRAMUSCULAR | Status: DC | PRN
  Administered 2017-12-03 – 2017-12-04 (×2): 0.2 mg via INTRAVENOUS
  Filled 2017-12-01 (×2): qty 1

## 2017-12-01 MED ORDER — POTASSIUM CHLORIDE CRYS ER 20 MEQ PO TBCR
40.0000 meq | EXTENDED_RELEASE_TABLET | Freq: Once | ORAL | Status: DC
  Filled 2017-12-01: qty 2

## 2017-12-01 MED ORDER — ORAL CARE MOUTH RINSE
15.0000 mL | Freq: Two times a day (BID) | OROMUCOSAL | Status: DC
  Administered 2017-12-01 – 2017-12-09 (×14): 15 mL via OROMUCOSAL

## 2017-12-01 NOTE — Progress Notes (Signed)
Additional conversation with the patient and the entire family.  After a long discussion, they were presented with the information provided by H/O and with options of temporary intubation vs comfort.  After explaining the situation decision was made to make patient a full DNR and call palliative for comfort care.  The patient is critically ill with multiple organ systems failure and requires high complexity decision making for assessment and support, frequent evaluation and titration of therapies, application of advanced monitoring technologies and extensive interpretation of multiple databases.   Critical Care Time devoted to patient care services described in this note is  45  Minutes. This time reflects time of care of this signee Dr Jennet Maduro. This critical care time does not reflect procedure time, or teaching time or supervisory time of PA/NP/Med student/Med Resident etc but could involve care discussion time.  Rush Farmer, M.D. [facility] Pulmonary/Critical Care Medicine. Pager: [phone]. After hours pager: [phone].

## 2017-12-01 NOTE — Progress Notes (Signed)
Patient started having increasing SOB. Became diaphoretic and confused. E-Link MD notified, orders to draw ABG.   RT performed ABG at bedside and results given to E-Link MD. RT placed patient on Bi-Pap and prepared to potentially intubate.   CCM doctors came to see patient and determined that he looked better on Bi-Pap. Not intubating at this time; keeping patient on Bi-pap and re-checking ABG.   Will continue to monitor patient.   Levi Brown, [place]

## 2017-12-01 NOTE — Progress Notes (Signed)
FPTS Interim Progress Note  S: Patient is currently sitting up in bed resting, on BiPAP. He still has difficulty breathing.  BP 129/89   Pulse (!) 115   Temp 98.2 F (36.8 C) (Axillary)   Resp (!) 27   Ht 5\' 6"  (1.676 m)   Wt 155 lb 10.3 oz (70.6 kg)   SpO2 97%   BMI 25.12 kg/m     A/P: Appreciate Critical Care Team and their excellent care and management of this patient. He is a pleasant person who is critically ill and his prognosis is poor. Will continue to follow.  Nuala Alpha, DO 12/01/2017, 10:30 AM PGY-1, [place] Medicine Service pager [phone]

## 2017-12-01 NOTE — Addendum Note (Signed)
Addended by: [name] on: 12/01/2017 01:01 PM   Modules accepted: Orders, Level of Service

## 2017-12-01 NOTE — Progress Notes (Signed)
PULMONARY / CRITICAL CARE MEDICINE   Name: Levi Brown MRN: [id number] DOB: 02-05-1956    ADMISSION DATE:  11/15/2017 CONSULTATION DATE:  11/27/2017  REFERRING MD:  Claudine Mouton  CHIEF COMPLAINT:  Hypercapneic respiratory failure  HISTORY OF PRESENT ILLNESS:   61 yo male former smoker admitted with dyspnea in setting of A flutter.  He was dx with Stage IV NSCLC in July 2018 tx with radiation to Lt mediastinal mass, and was being evaluated to start chemotherapy.  He was seen by oncology on 11/18/17, and at that time was still deciding whether he would want chemotherapy.  He developed worsening respiratory distress 12/15 and required intubation.  He was found to have post-obstructive pneumonia.  SUBJECTIVE:  Hypercarbic respiratory failure overnight that required and continues to require BiPAP  VITAL SIGNS: BP 100/77   Pulse 99   Temp 98.2 F (36.8 C) (Axillary)   Resp 17   Ht 5\' 6"  (1.676 m)   Wt 70.6 kg (155 lb 10.3 oz)   SpO2 100%   BMI 25.12 kg/m   VENTILATOR SETTINGS: Vent Mode: BIPAP;PCV FiO2 (%):  [50 %-60 %] 50 % Set Rate:  [15 bmp] 15 bmp PEEP:  [6 cmH20] 6 cmH20 Plateau Pressure:  [16 cmH20] 16 cmH20  INTAKE / OUTPUT: I/O last 3 completed shifts: In: 3630 [P.O.:1730; IV Piggyback:1900] Out: 2985 [Urine:2985]  PHYSICAL EXAMINATION: General: Chronically ill appearing male, on BiPAP, uncomfortable Neuro: Alert and interactive, moving all ext to command HEENT: [place]/AT, PERRL, EOM-I and MMM Cardiovascular: RRR, Nl S1/S2 and -M/R/G Lungs: No audible BS on the left at this point, coarse on the right Abdomen: Soft, NT, ND and +BS Musculoskeletal: -edema and -tenderness Skin: Intact  LABS:  BMET Recent Labs  Lab 11/29/17 1132 11/30/17 0353 12/01/17 0312  NA 135 136 132*  K 3.6 3.4* 4.7  CL 91* 92* 90*  CO2 34* 36* 36*  BUN 8 5* 11  CREATININE 0.53* 0.59* 0.63  GLUCOSE 111* 127* 121*   Electrolytes Recent Labs  Lab 11/29/17 0243 11/29/17 1132  11/30/17 0353 11/30/17 1141 12/01/17 0312  CALCIUM 8.5* 8.9 8.9  --  9.0  MG 2.0  --  2.0  --  1.9  PHOS 1.9*  --  4.8* 3.7 4.2   CBC Recent Labs  Lab 11/29/17 0243 11/30/17 0353 12/01/17 0312  WBC 13.7* 17.7* 21.6*  HGB 8.9* 9.1* 9.2*  HCT 27.8* 28.9* 30.8*  PLT 360 332 322   Coag's No results for input(s): APTT, INR in the last 168 hours.  Sepsis Markers Recent Labs  Lab 11/28/17 0644 11/29/17 0243 11/30/17 0353  PROCALCITON 0.37 0.29 0.18   ABG Recent Labs  Lab 11/27/17 1814 11/27/17 2010 12/01/17 0115  PHART 7.285* 7.462* 7.267*  PCO2ART 88.1* 55.0* 81.6*  PO2ART 405.0* 140.0* 140.0*   Liver Enzymes Recent Labs  Lab 12/05/2017 1837  ALBUMIN 2.6*   Cardiac Enzymes Recent Labs  Lab 11/23/2017 1837 11/27/17 0004 11/27/17 0623  TROPONINI <0.03 <0.03 <0.03   Glucose Recent Labs  Lab 11/29/17 1515 11/29/17 2004 11/30/17 0013 11/30/17 0351 11/30/17 0743 11/30/17 1141  GLUCAP 107* 101* 101* 119* 106* 108*   Imaging No results found. 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  ASSESSMENT / PLAN:  PULMONARY A: Acute hypoxic/hypercapnic respiratory failure from post obstructive pneumonia, pleural effusion. Developed acute hypercarbic respiratory failure overnight requiring BiPAP P:   Maintain on BiPAP for now Family coming in for further discussion Resume  eliquis See discussion below Give 2 doses of lasix today Antibiotics as above U/S of the chest performed yesterday, not enough fluid to tap to make a difference  CARDIOVASCULAR A:  Converted atrial flutter on amiodarone Hypotension P:  No procedures, restart eliquis Cardiology following, appreciate input Monitor CVP Tele monitoring  RENAL A:   Low Mg P: Replace electrolytes as indicated BMET in AM Recheck BMET and Phos this afternoon Lasix 40 mg IV q8 x2 doses K-dur 40 meq PO x1  GASTROINTESTINAL A:   Nutrition P:   NPO for respiratory failure PPI  HEMATOLOGIC A:   Anemia-normocytic-likely related to underlying malignancy P:  Follow-up CBC Plan for anti-coagulation as above  INFECTIOUS A:   Post-obstructive HCAP P:   Continue Vanco and Zosyn as above Follow culture data, none available at this point Diagnostic evaluation of pleural fluid not doeable due to lack of accessible pockets of fluid  ENDOCRINE A:   Borderline elevated TSH with nl free T4 P:   Primary service had planned to repeat in 6 weeks as OPD. No intervention warranted at present Monitor glucose on chemistry  NEUROLOGIC A:   Sedated post intubation P:   Minimize sedation  FAMILY  - Updates: Patient decompensated overnight, required BiPAP.  I spoke with Dr. Earlie Server who informed me that patient received the maximum amount of radiation and no more radiation can be offered.  Chemo can not be done until patient is stable and out of the hospital and will be much slower so can not be used emergently obviously to avert failure.  Spoke with patient and niece.  Informed of details above.  He was reluctant to make decisions until sister arrive.  Niece is calling them.  In the meantime will order a CXR to see if there is something that can be done emergently.  Will come back when family is back.  The patient is critically ill with multiple organ systems failure and requires high complexity decision  making for assessment and support, frequent evaluation and titration of therapies, application of advanced monitoring technologies and extensive interpretation of multiple databases.   Critical Care Time devoted to patient care services described in this note is  60 Minutes. This time reflects time of care of this signee Dr Jennet Maduro. This critical care time does not reflect procedure time, or teaching time or supervisory time of PA/NP/Med student/Med Resident etc but could involve care discussion time.  Rush Farmer, M.D. [facility] Pulmonary/Critical Care Medicine. Pager: [phone]. After hours pager: [phone].

## 2017-12-01 NOTE — Progress Notes (Signed)
Chart reviewed and discussed with Dr. Nelda Marseille. Patient resting comfortably on BiPAP. Discussed GOC with sister, [name] at bedside. She confirms DNR/DNI and focus on comfort. Hopeful for antibiotics to "clear up pneumonia." Will continue current interventions for now. Sister, [name] has been primary caregiver for Mr. Heist. Will f/u 12/20 at 10am to further discuss [place] and transition to comfort measures when [name] is present.   Agree with prn morphine for pain/dyspnea. Added prn ativan and robinul. Full note to follow.   NO CHARGE  [name], FNP-C Palliative Medicine Team  Phone: [phone] Fax: [phone]

## 2017-12-01 NOTE — Progress Notes (Signed)
Called to bedside to evaluate for respiratory distress.  Found patient already placed on BiPAP.  ABG showed acute respiratory acidosis.  Physical exam showed distant lung sounds, mild tachypnea, adequate tidal volume.  Patient conscious alert and oriented x3 able to follow two-step commands.  Assessment: Acute on chronic hypercapnic respiratory failure  Plan: Continue with noninvasive ventilation repeat ABG in 30 minutes consider invasive mechanical ventilation if does not improve or if he worsens.  Reassessment after subsequent ABG: Patient remains alert and oriented and appropriate for noninvasive ventilation.  ABG shows mild improvement in respiratory acidosis.  Bicarbonate level is at what appears to be reasonable prior baseline.  No indication for Diamox at present.  Continue with noninvasive ventilation  Upon my evaluation, this patient had a high probability of imminent or life-threatening deterioration due to acute on chronic hypercapnic respiratory failure  high complexity decision making to assess, manipulate, and support vital organ system failure including noninvasive ventilation  I have personally provided 35 minutes of critical care time exclusive of time spent on separately billable procedures and education. Time includes review and summation of previous medical record, laboratory data, radiology results, coordination of care with RN and respiratory therapy, and monitoring for potential decompensation. Interventions were performed as documented above.  Charlesetta Garibaldi, MD  Critical Care Medicine [place] Pager: [phone]

## 2017-12-01 NOTE — Care Management Note (Addendum)
Case Management Note  Patient Details  Name: Levi Brown MRN: [id number] Date of Birth: 05/20/1956  Subjective/Objective:   From home with his uncle 60 yo) and his sister, presents with  Aflutter,hypercapneic resp failure , post obstructive pna, metastatic lung cancer with recurrent pl effusion.  On vent will try to extubate today per MD note.   He has home oxygen with [facility] (3-4 liters) as needed.  He has PCP and medication coverage with Medicaid.      12/19 [place], BSN - NCM spoke with sister , [name], she chose [facility] for HHPT and rolling walker, from [facility].  NCM made referral to Butch Penny with [facility].   SOC will begin 24-48 hrs post discharge. Will need HHPT order with face to face and DME order for rolling walker before discharge.   Per palliative note, pateint will be comfort care, looks like palliative will discuss with sister again tomorrow.  12/20 [place], BSN - Palliative met with sister [name] this am,  NCM trying to find out what was outcome of meeting, left message for [name] NP to call NCM back with information.   NCM received call form [name], she states the sister [name] would like to cont to receive abx and txts to see if this will resolve it self before deciding to go with hospice.  [name] states she will have to see how long the attending wants to keep him on iv abx.  At this point he will still be going home with [facility] services.                          Action/Plan:   Expected Discharge Date:                  Expected Discharge Plan:  [place]  In-House Referral:     Discharge planning Services  CM Consult  Post Acute Care Choice:  Durable Medical Equipment, Home Health Choice offered to:  Sibling  DME Arranged:  Walker rolling DME Agency:  [place] Arranged:  PT [place]:  [place]  Status of Service:  Completed, signed off  If discussed at [place] of Stay  Meetings, dates discussed:    Additional Comments:  [name], RN 12/01/2017, 3:42 PM

## 2017-12-01 NOTE — Progress Notes (Signed)
Text paged Levi Brown, palliative NP, notifying her of DNR status and need for palliative services.

## 2017-12-01 NOTE — Consult Note (Signed)
Consultation Note Date: 12/01/17  Patient Name: Levi Brown  DOB: [birth year]  MRN: [id number]  Age / Sex: 61 y.o., male  PCP: Levi Jun, FNP Referring Physician: Jamesetta So, MD  Reason for Consultation: Establishing goals of care  HPI/Patient Profile: 61 y.o. male  with past medical history of stroke, COPD, non-small cell lung cancer diagnosed in July 2018, home oxygen 2L, ETOH, and past smoker admitted on 11/15/2017 with shortness of breath. Followed by Dr. Earlie Brown s/p radiation to left mediastinal mass. Patient was considering starting palliative chemotherapy. This hospitalization, found to have post-obstructive pneumonia and receiving IV antibiotics. Required intubation 12/15 for worsening respiratory distress. Extubated 12/18. Patient decompensated 12/19 AM requiring BiPAP. PCCM met with family for Levi Brown. Temporary intubation versus comfort. Patient and family determined goals of DNR/DNI and comfort care. Palliative medicine consultation placed.   Clinical Assessment and Goals of Care: I have reviewed medical records, discussed with care team, and met with patient and sister Levi Brown) to discuss diagnosis, prognosis, GOC, EOL wishes, disposition and options. Patient resting comfortably on BiPAP.   Introduced Palliative Medicine as specialized medical care for people living with serious illness. It focuses on providing relief from the symptoms and stress of a serious illness. The goal is to improve quality of life for both the patient and the family.  We discussed a brief life review of the patient. Levi Brown lives with his [phone] Barbaraann Rondo. No spouse or children. After diagnosis of cancer in July, his sister Levi Brown) became his primary support in understanding his medical condition and driving to and from appointments/radiation. Prior to this admission, Levi Brown speaks of "good days and bad days." He has  been able to perform ADL's but has been more short of breath on exertion, with oxygen. Poor appetite.   Discussed course of hospitalization and interventions including decline over night requiring BiPAP. Levi Brown recalls their family conversation with Dr. Nelda Marseille. Levi Brown was awake and alert for this conversation and was able to speak of his wishes for no further intubation and focus on comfort.   Levi Brown has seen a transition today and feels like he is "at peace." His minister has visited and provided prayer and support.   Discussed transition to comfort focused care with goal of comfort, quality, dignity at EOL. Educated on medications as needed for comfort. Discussed treating dyspnea with medications over escalation of care (back to BiPAP). Levi Brown speaks of continuing antibiotics to "clear up pneumonia." I explained that this type of obstructive pneumonia may never completely clear secondary to advancing cancer. Also, that he has received aggressive antibiotics since 12/14 and has not shown clinical improvement but decline with high oxygen requirements.   Levi Brown does not have a documented HCPOA but Levi Brown has been most involved in his care. She will be at the Brown tomorrow morning and Levi Brown request I f/u in AM.   Therapeutic listening as Levi Brown shares stories of his joy for "tinkering with his motor bike" and his ability to fix anything. Emotional and spiritual support provided.  SUMMARY OF RECOMMENDATIONS    DNR/DNI  Continue current interventions. Treat dyspnea with morphine.  Comfort feeds per patient/family request. Pre-medicate with prn morphine prior to taking off BiPAP.   PMT will f/u in AM to further discuss transition to comfort measures with patient and family.   Code Status/Advance Care Planning:  DNR  Symptom Management:   Morphine 2-35m IV q1h prn pain/dyspnea  Ativan 1107mIV q4h prn anxiety/agitation  Robinul 0.37m35mV q4h prn secretions  Palliative  Prophylaxis:   Delirium Protocol, Frequent Pain Assessment, Oral Care and Turn Reposition  Psycho-social/Spiritual:   Desire for further Chaplaincy support:yes  Additional Recommendations: Caregiving  Support/Resources and Education on Hospice  Prognosis:   < 2 weeks  Discharge Planning: To Be Determined      Primary Diagnoses: Present on Admission: . Atrial flutter ([facility] I have reviewed the medical record, interviewed the patient and family, and examined the patient. The following aspects are pertinent.  Past Medical History:  Diagnosis Date  . Former smoker    Quit smoking in early 2017  . [place] [facility]  . [facility]   [place] - pt unaware, but imaging suggests old strokes   Social History   Socioeconomic History  . Marital status: Single    Spouse name: None  . Number of children: None  . Years of education: None  . Highest education level: None  Social Needs  . Financial resource strain: None  . Food insecurity - worry: None  . Food insecurity - inability: None  . Transportation needs - medical: None  . Transportation needs - non-medical: None  Occupational History  . None  Tobacco Use  . Smoking status: Former Smoker    Packs/day: 1.00    Years: 30.00    Pack years: 30.00    Types: Cigarettes    Last attempt to quit: 2017    Years since quitting: 1.9  . Smokeless tobacco: Never Used  . Tobacco comment: quit smoking in 2017  Substance and Sexual Activity  . Alcohol use: Yes    Alcohol/week: 8.4 - 12.6 oz    Types: 14 - 21 Cans of beer per week  . Drug use: No  . Sexual activity: No  Other Topics Concern  . None  Social History Narrative  . None   Family History  Problem Relation Age of Onset  . Asthma Mother   . COPD Mother   . Cancer Mother        breast?   Scheduled Meds: . amiodarone  400 mg Oral BID  . chlorhexidine  15 mL Mouth Rinse BID  . feeding supplement (PRO-STAT SUGAR  FREE 64)  30 mL Oral BID  . furosemide  40 mg Intravenous Q8H  . Influenza vac split quadrivalent PF  0.5 mL Intramuscular Tomorrow-1000  . ipratropium-albuterol  3 mL Nebulization Q4H  . mouth rinse  15 mL Mouth Rinse q12n4p  . pantoprazole (PROTONIX) IV  40 mg Intravenous Q24H  . potassium chloride  40 mEq Oral Once  . sodium chloride flush  10-40 mL Intracatheter Q12H   Continuous Infusions: . sodium chloride    . piperacillin-tazobactam (ZOSYN)  IV 3.375 g (12/01/17 0957)  . vancomycin Stopped (12/01/17 1159)   PRN Meds:.Place/Maintain arterial line **AND** sodium chloride, acetaminophen (TYLENOL) oral liquid 160 mg/5 mL, albuterol, atropine, fentaNYL (SUBLIMAZE) injection, fentaNYL (SUBLIMAZE) injection, glycopyrrolate, LORazepam, morphine injection, senna-docusate, sodium chloride flush, zolpidem Medications Prior to Admission:  Prior  to Admission medications   Medication Sig Start Date End Date Taking? Authorizing Provider  albuterol (PROVENTIL HFA;VENTOLIN HFA) 108 (90 Base) MCG/ACT inhaler Inhale 2 puffs into the lungs every 4 (four) hours as needed for wheezing or shortness of breath (cough, shortness of breath or wheezing.). 08/17/17  Yes Jegede, Olugbemiga E, MD  aspirin EC 81 MG tablet Take 1 tablet (81 mg total) by mouth daily. 07/08/17 07/08/18 Yes Florencia Reasons, MD  feeding supplement (BOOST / RESOURCE BREEZE) LIQD Take 1 Container by mouth 3 (three) times daily between meals. 09/04/17  Yes Florencia Reasons, MD  ferrous sulfate (FERROUSUL) 325 (65 FE) MG tablet Take 1 tablet (325 mg total) by mouth 2 (two) times daily with a meal. 09/29/17  Yes Levi Jun, FNP  furosemide (LASIX) 20 MG tablet Take 1 tablet (20 mg total) by mouth daily as needed. 08/23/17  Yes Levi Jun, FNP  ipratropium-albuterol (DUONEB) 0.5-2.5 (3) MG/3ML SOLN Take 3 mLs by nebulization every 4 (four) hours as needed. 08/31/17  Yes Jani Gravel, MD  metoprolol tartrate (LOPRESSOR) 25 MG tablet Take 1 tablet (25  mg total) by mouth daily. 09/29/17  Yes Levi Jun, FNP  Multiple Vitamins-Minerals (DRY EYE FORMULA PO) Place 1 drop into both eyes daily.   Yes [provider]  ondansetron (ZOFRAN ODT) 4 MG disintegrating tablet Take 1 tablet (4 mg total) by mouth every 8 (eight) hours as needed for nausea or vomiting. 08/23/17  Yes Levi Jun, FNP  potassium chloride SA (K-DUR,KLOR-CON) 20 MEQ tablet Take 1 tablet (20 mEq total) by mouth daily. 08/17/17  Yes Levi Jun, FNP  predniSONE (DELTASONE) 20 MG tablet Take 20 mg by mouth daily with breakfast. 11/08/17  Yes [provider]  prochlorperazine (COMPAZINE) 10 MG tablet Take 1 tablet (10 mg total) by mouth every 6 (six) hours as needed for nausea or vomiting. 09/22/17  Yes Curt Bears, MD  promethazine-codeine [facility] WITH CODEINE) 6.25-10 MG/5ML syrup Take 5 mLs by mouth every 6 (six) hours as needed for cough. 11/12/17  Yes Levi Jun, FNP  senna-docusate (SENOKOT-S) 8.6-50 MG tablet Take 1-2 tablets by mouth at bedtime. Patient taking differently: Take 1-2 tablets by mouth at bedtime as needed for mild constipation.  09/15/17  Yes Levi Jun, FNP  sucralfate (CARAFATE) 1 GM/10ML suspension Take 10 mLs (1 g total) by mouth 3 (three) times daily with meals. Patient taking differently: Take 1 g by mouth 3 (three) times daily as needed (ulcers).  09/15/17  Yes Levi Jun, FNP  traZODone (DESYREL) 50 MG tablet Take 0.5-1 tablets (25-50 mg total) by mouth at bedtime as needed for sleep. 08/23/17  Yes Levi Jun, FNP  azithromycin (ZITHROMAX) 250 MG tablet Take 2 tabs PO x 1 dose, then 1 tab PO QD x 4 days Patient taking differently: Take 250 mg by mouth See admin instructions. Take 2 tabs PO x 1 dose, then 1 tab PO QD x 4 days 11/12/17   Levi Jun, FNP   No Known Allergies Review of Systems  Unable to perform ROS: Other   Physical Exam  Constitutional: He is easily aroused.  He appears ill.  HENT:  Head: Normocephalic and atraumatic.  Cardiovascular: Regular rhythm.  Pulmonary/Chest: No accessory muscle usage. No tachypnea. No respiratory distress.  Resting comfortably on BiPAP  Neurological: He is easily aroused.  Skin: Skin is warm and dry.  Nursing note and vitals reviewed.  Vital Signs: BP 134/87  Pulse (!) 111   Temp 98.4 F (36.9 C) (Oral)   Resp (!) 22   Ht _0  (1.676 m)   Wt 70.6 kg (155 lb 10.3 oz)   SpO2 99%   BMI 25.12 kg/m  Pain Assessment: No/denies pain   Pain Score: 0-No pain   SpO2: SpO2: 99 % O2 Device:SpO2: 99 % O2 Flow Rate: .O2 Flow Rate (L/min): 6 L/min  IO: Intake/output summary:   Intake/Output Summary (Last 24 hours) at 12/01/2017 1451 Last data filed at 12/01/2017 1200 Gross per 24 hour  Intake 1850 ml  Output 1225 ml  Net 625 ml    LBM: Last BM Date: 11/30/17 Baseline Weight: Weight: 72.6 kg (160 lb 0.9 oz) Most recent weight: Weight: 70.6 kg (155 lb 10.3 oz)     Palliative Assessment/Data: PPS 30%   Flowsheet Rows     Most Recent Value  Intake Tab  Referral Department  Critical care  Unit at Time of Referral  ICU  Palliative Care Primary Diagnosis  Cancer  Palliative Care Type  New Palliative care  Date first seen by Palliative Care  12/01/17  Clinical Assessment  Palliative Performance Scale Score  30%  Psychosocial & Spiritual Assessment  Palliative Care Outcomes  Patient/Family meeting held?  Yes  Who was at the meeting?  sister Levi Brown)  [place]  Provided end of life care assistance, Provided psychosocial or spiritual support, ACP counseling assistance, Clarified goals of care, Improved pain interventions, Improved non-pain symptom therapy, Counseled regarding hospice      Time In: 3845 Time Out: 1500 Time Total: 76mn Greater than 50%  of this time was spent counseling and coordinating care related to the above assessment and plan.  Signed by:  MIhor Dow  FNP-C Palliative Medicine Team  Phone: [phone]: [phone]  Please contact Palliative Medicine Team phone at [phone] questions and concerns.  For individual provider: See AShea Evans

## 2017-12-01 NOTE — Progress Notes (Addendum)
PT Cancellation Note  Patient Details Name: Levi Brown MRN: [id number] DOB: 1956-08-09   Cancelled Treatment:    Reason Eval/Treat Not Completed: Patient not medically ready. Pt currently on BiPAP. Per RN, now comfort care and DNR status. May or may not be appropriate for PT treatment tomorrow. Will follow-up tomorrow.  Mabeline Caras, PT, DPT Acute Rehab Services  Pager: [phone] 12/01/2017, 2:16 PM

## 2017-12-02 ENCOUNTER — Inpatient Hospital Stay (HOSPITAL_COMMUNITY): Payer: Medicaid Other

## 2017-12-02 DIAGNOSIS — Z7189 Other specified counseling: Secondary | ICD-10-CM

## 2017-12-02 DIAGNOSIS — J189 Pneumonia, unspecified organism: Secondary | ICD-10-CM

## 2017-12-02 DIAGNOSIS — R0602 Shortness of breath: Secondary | ICD-10-CM

## 2017-12-02 DIAGNOSIS — J9622 Acute and chronic respiratory failure with hypercapnia: Secondary | ICD-10-CM

## 2017-12-02 DIAGNOSIS — J9620 Acute and chronic respiratory failure, unspecified whether with hypoxia or hypercapnia: Secondary | ICD-10-CM | POA: Diagnosis present

## 2017-12-02 LAB — BASIC METABOLIC PANEL
ANION GAP: 7 (ref 5–15)
BUN: 11 mg/dL (ref 6–20)
CHLORIDE: 87 mmol/L — AB (ref 101–111)
CO2: 42 mmol/L — ABNORMAL HIGH (ref 22–32)
Calcium: 9.1 mg/dL (ref 8.9–10.3)
Creatinine, Ser: 0.82 mg/dL (ref 0.61–1.24)
GFR calc non Af Amer: >60 mL/min (ref >60)
Glucose, Bld: 93 mg/dL (ref 65–99)
POTASSIUM: 3.7 mmol/L (ref 3.5–5.1)
SODIUM: 136 mmol/L (ref 135–145)

## 2017-12-02 LAB — CULTURE, BLOOD (ROUTINE X 2)
CULTURE: NO GROWTH
Culture: NO GROWTH
Special Requests: ADEQUATE
Special Requests: ADEQUATE

## 2017-12-02 LAB — CBC
HCT: 28.5 % — ABNORMAL LOW (ref 39.0–52.0)
HEMOGLOBIN: 8.3 g/dL — AB (ref 13.0–17.0)
MCH: 24.9 pg — AB (ref 26.0–34.0)
MCHC: 29.1 g/dL — ABNORMAL LOW (ref 30.0–36.0)
MCV: 85.3 fL (ref 78.0–100.0)
Platelets: 300 10*3/uL (ref 150–400)
RBC: 3.34 MIL/uL — AB (ref 4.22–5.81)
RDW: 17.5 % — ABNORMAL HIGH (ref 11.5–15.5)
WBC: 15.7 10*3/uL — AB (ref 4.0–10.5)

## 2017-12-02 LAB — PHOSPHORUS: PHOSPHORUS: 3.3 mg/dL (ref 2.5–4.6)

## 2017-12-02 LAB — MAGNESIUM: MAGNESIUM: 1.9 mg/dL (ref 1.7–2.4)

## 2017-12-02 MED ORDER — MORPHINE SULFATE (CONCENTRATE) 10 MG/0.5ML PO SOLN
5.0000 mg | Freq: Three times a day (TID) | ORAL | Status: DC
  Administered 2017-12-02 – 2017-12-07 (×11): 5 mg via SUBLINGUAL
  Filled 2017-12-02 (×11): qty 0.5

## 2017-12-02 MED ORDER — MORPHINE SULFATE (CONCENTRATE) 10 MG/0.5ML PO SOLN: 10.0000 mg | ORAL | Status: DC | PRN

## 2017-12-02 MED ORDER — INFLUENZA VAC SPLIT QUAD 0.5 ML IM SUSY: 0.5000 mL | PREFILLED_SYRINGE | INTRAMUSCULAR | Status: DC | PRN

## 2017-12-02 MED ORDER — LORAZEPAM 1 MG PO TABS: 1.0000 mg | ORAL_TABLET | ORAL | Status: DC | PRN

## 2017-12-02 NOTE — Progress Notes (Signed)
FPTS Interim Progress Note  S: Talked with patient today and he states he feels better. Discussed with him he and his family's decisions to go with comfort care and palliative support. I expressed agreement with his decision. Patient states he never had radiation but per oncology note he did have some. I will explore this further.  Informed patient I appreciated CCM's excellent care of this patient.  O: Patient is on 6L of O2 [place] this morning.  BP 94/69   Pulse (!) 104   Temp 97.7 F (36.5 C) (Axillary)   Resp 18   Ht 5\' 6"  (1.676 m)   Wt 157 lb 3 oz (71.3 kg)   SpO2 100%   BMI 25.37 kg/m     A/P: CCM is transfering to floor tomorrow and I will resume care. BiPAP for comfort only. Antibiotic course will continue due to family hopeful that infection may "clear up" and I will continue as it will do no harm to patient at this point. I will also follow up his oncology treatment as there seems to be some discrepancy between oncology note and patient understanding of where he is in his treatment and what options he has.  Nuala Alpha, DO 12/02/2017, 9:19 AM PGY-1, [place] Medicine Service pager [phone]

## 2017-12-02 NOTE — Progress Notes (Addendum)
PULMONARY / CRITICAL CARE MEDICINE   Name: Levi Brown MRN: [id number] DOB: 11-Oct-1956    ADMISSION DATE:  12/08/2017 CONSULTATION DATE:  11/27/2017  REFERRING MD:  Claudine Mouton  CHIEF COMPLAINT:  Hypercapneic respiratory failure  HISTORY OF PRESENT ILLNESS:   61 yo male former smoker admitted with dyspnea in setting of A flutter.  He was dx with Stage IV NSCLC in July 2018 tx with radiation to Lt mediastinal mass, and was being evaluated to start chemotherapy.  He was seen by oncology on 11/18/17, and at that time was still deciding whether he would want chemotherapy.  He developed worsening respiratory distress 12/15 and required intubation.  He was found to have post-obstructive pneumonia.  SUBJECTIVE:  Remains on BiPAP overnight, c/o increased SOB  VITAL SIGNS: BP 94/69   Pulse (!) 104   Temp 97.7 F (36.5 C) (Axillary)   Resp 18   Ht 5\' 6"  (1.676 m)   Wt 71.3 kg (157 lb 3 oz)   SpO2 100%   BMI 25.37 kg/m   VENTILATOR SETTINGS: Vent Mode: BIPAP FiO2 (%):  [50 %] 50 % Set Rate:  [15 bmp] 15 bmp PEEP:  [6 cmH20] 6 cmH20 Pressure Support:  [14 cmH20] 14 cmH20  INTAKE / OUTPUT: I/O last 3 completed shifts: In: 2830 [P.O.:1130; IV Piggyback:1700] Out: 1884 [Urine:3450]  PHYSICAL EXAMINATION: General: Chronically ill appearing male, in visible respiratory distress on BiPAP Neuro: Alert and interactive, moving all ext to command HEENT: Gross/AT, PERRL, EOM-I and MMM Cardiovascular: RRR, Nl S1/S2 and -M/R/G. Lungs: No BS on the left, right diminished  Abdomen: Soft, NT, ND and +BS Musculoskeletal: -edema and -tenderness Skin: Intact  LABS:  BMET Recent Labs  Lab 11/30/17 0353 12/01/17 0312 12/02/17 0350  NA 136 132* 136  K 3.4* 4.7 3.7  CL 92* 90* 87*  CO2 36* 36* 42*  BUN 5* 11 11  CREATININE 0.59* 0.63 0.82  GLUCOSE 127* 121* 93   Electrolytes Recent Labs  Lab 11/30/17 0353 11/30/17 1141 12/01/17 0312 12/02/17 0350  CALCIUM 8.9  --  9.0 9.1  MG  2.0  --  1.9 1.9  PHOS 4.8* 3.7 4.2 3.3   CBC Recent Labs  Lab 11/30/17 0353 12/01/17 0312 12/02/17 0350  WBC 17.7* 21.6* 15.7*  HGB 9.1* 9.2* 8.3*  HCT 28.9* 30.8* 28.5*  PLT 332 322 300   Coag's No results for input(s): APTT, INR in the last 168 hours.  Sepsis Markers Recent Labs  Lab 11/28/17 0644 11/29/17 0243 11/30/17 0353  PROCALCITON 0.37 0.29 0.18   ABG Recent Labs  Lab 11/27/17 1814 11/27/17 2010 12/01/17 0115  PHART 7.285* 7.462* 7.267*  PCO2ART 88.1* 55.0* 81.6*  PO2ART 405.0* 140.0* 140.0*   Liver Enzymes Recent Labs  Lab 12/06/2017 1837  ALBUMIN 2.6*   Cardiac Enzymes Recent Labs  Lab 11/30/2017 1837 11/27/17 0004 11/27/17 0623  TROPONINI <0.03 <0.03 <0.03   Glucose Recent Labs  Lab 11/29/17 1515 11/29/17 2004 11/30/17 0013 11/30/17 0351 11/30/17 0743 11/30/17 1141  GLUCAP 107* 101* 101* 119* 106* 108*   Imaging Dg Chest Port 1 View  Result Date: 12/01/2017 CLINICAL DATA:  Acute respiratory failure with hypercapnia EXAM: PORTABLE CHEST 1 VIEW COMPARISON:  Portable chest x-ray of November 29, 2017 FINDINGS: There has been interval extubation of the trachea and of the esophagus. The right lung is well-expanded. The interstitial markings remain increased in the mid and lower right lung and there is a small right pleural effusion. On the left  the hemidiaphragm remains elevated. The large left upper lobe mass persists. Pleural fluid on the left is slightly less conspicuous today. The cardiac silhouette remains enlarged. The mediastinum remains widened. The right internal jugular venous catheter tip projects over the proximal SVC. IMPRESSION: Interval extubation of the trachea and of the esophagus. Persistent volume loss on the left with basilar atelectasis or pneumonia. Increased density in the right mid and lower lung is consistent with interstitial edema or infiltrate. Persistent large left upper lobe mass with mediastinal widening. Electronically  Signed   By: David  Martinique M.D.   On: 12/01/2017 09:10   STUDIES:  CTA chest 12/15 > >>No PE, stable LUL mass of LUL pulmonary artery, possible lingular/left lower lobe pneumonia, large left pleural effusion  CULTURES: Blood cultures x2 12/15 >>NTD Tracheal aspirate 12/15 >>NTD Urine 12/15 >>NTD  ANTIBIOTICS: vanc 12/15>>> Zosyn 12/15 >>  SIGNIFICANT EVENTS: 12/15 Acute hypercapneic respiratory failure  LINES/TUBES: ETT 12/15>>>12/17 R IJ CVL 12/15>>>  I reviewed CXR myself, left sided opacifications noted  DISCUSSION: 61 year old male with history of stage IV non-small cell lung cancer with left-sided obstructing mediastinal mass admitted 12/15 with acute respiratory failure related to postobstructive pneumonia and atrial flutter.  Discussed with FPTS.  ASSESSMENT / PLAN:  PULMONARY A: Acute hypoxic/hypercapnic respiratory failure from post obstructive pneumonia, pleural effusion. Developed acute hypercarbic respiratory failure overnight requiring BiPAP P:   BiPAP for comfort at this point DNI Resume eliquis Antibiotics as above U/S of the chest performed yesterday, not enough fluid to tap to make a difference  CARDIOVASCULAR A:  Converted atrial flutter on amiodarone Hypotension P:  Tele monitoring D/C CVP checks Full DNR  RENAL A:   No active issues P: Hold further lasix No BMET ordered for AM  GASTROINTESTINAL A:   Nutrition P:   Regular diet and comfort feeds at this point, acknowledge respiratory failure potential and BiPAP but that is for comfort at this point  HEMATOLOGIC A:   Anemia-normocytic-likely related to underlying malignancy P:  Will not transfuse at this point, no CBC ordered for AM  INFECTIOUS A:   Post-obstructive HCAP P:   Continue Vanco and Zosyn as sisters are still hopeful that patient may improve Diagnostic evaluation of pleural fluid not doeable due to lack of accessible pockets of fluid  ENDOCRINE A:   Borderline  elevated TSH with nl free T4 P:   Will not address TSH at this point  NEUROLOGIC A:   Sedated post intubation Comfort measures P:   Morphine IV during the day for pain Ativan for anxiety Robinol for pain at night Appreciate input from palliative care  FAMILY  - Updates: Patient is a full DNR at this point, on BiPAP for comfort.  Palliative care following.  Family still wishes for abx for a chance the infection may "clear up".  Will not take that hope away from them but they clearly made the right decision of focusing on comfort.  Will transfer to SDU bed that can accommodate BiPAP and back to FPTS with PCCM off 12/21.  Rush Farmer, M.D. [facility] Pulmonary/Critical Care Medicine. Pager: [phone]. After hours pager: [phone].

## 2017-12-02 NOTE — Progress Notes (Signed)
Pharmacy Antibiotic Note  Levi Brown is a 61 y.o. male admitted on 12/08/2017 with pneumonia.  Pharmacy has been consulted for Vancomycin/Zosyn  dosing.  Day #6 of antibiotic therapy. WBC continues to remain elevated (15.7 today). Patient is afebrile. Renal function has been variable (0.5-0.8 today; uop 1.7 mL/kg/hr). No growth on cultures. Given no growth on cx and MRSA PCR negative, MD okay with discontinuing vancomycin. Trough was drawn prior to discussion. Level ~10 hours after last dose came back supratherapeutic at 27- anticipate slightly lower at true trough. Regardless will continue plan of discontinuing vancomycin. MD still wants to continue with zosyn at this time.   Plan: Discontinue vancomycin Continue Zosyn 3.375 grams iv Q 8 hours  Height: 5\' 6"  (167.6 cm) Weight: 157 lb 3 oz (71.3 kg) IBW/kg (Calculated) : 63.8  Temp (24hrs), Avg:98.2 F (36.8 C), Min:97.7 F (36.5 C), Max:99.2 F (37.3 C)  Recent Labs  Lab 11/28/17 0644 11/29/17 0243 11/29/17 1132 11/29/17 1930 11/30/17 0353 12/01/17 0312 12/02/17 0350 12/02/17 0836  WBC 11.4* 13.7*  --   --  17.7* 21.6* 15.7*  --   CREATININE 0.61 0.57* 0.53*  --  0.59* 0.63 0.82  --   VANCOTROUGH  --   --   --  9*  --   --   --  27*    Estimated Creatinine Clearance: 86.4 mL/min (by C-G formula based on SCr of 0.82 mg/dL).    No Known Allergies  Thank you for allowing pharmacy to be a part of this patient's care.  Doylene Canard, PharmD Clinical Pharmacist  Pager: [phone] Clinical Phone for 12/02/2017 until 3:30pm: [phone] If after 3:30pm, please call main pharmacy at [phone] 12/02/2017 10:06 AM
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
## 2017-12-03 LAB — VANCOMYCIN, TROUGH: VANCOMYCIN TR: 27 ug/mL — AB (ref 15–20)

## 2017-12-03 MED ORDER — AMIODARONE HCL 200 MG PO TABS
200.0000 mg | ORAL_TABLET | Freq: Every day | ORAL | Status: DC
  Administered 2017-12-03 – 2017-12-07 (×4): 200 mg via ORAL
  Filled 2017-12-03 (×5): qty 1

## 2017-12-03 MED ORDER — AMOXICILLIN-POT CLAVULANATE 875-125 MG PO TABS
1.0000 | ORAL_TABLET | Freq: Two times a day (BID) | ORAL | Status: DC
  Administered 2017-12-03 – 2017-12-05 (×4): 1 via ORAL
  Filled 2017-12-03 (×4): qty 1

## 2017-12-03 NOTE — Progress Notes (Signed)
RT on floor and asked by Caesar, RN to assess pt.  Pt sitting up in chair and appears to be breathing comfortably with an SpO2 of 100% on O2.  Sister at bedside states she wants the mask "to help patient get those secretions up."  I explained to her that the BiPAP isn't indicated at this time and there are medications ordered to assist patient with moving secretions and the BiPAP pressure would help push then "down".   I suggested a dose of Robinul that is ordered.  Caesar, RN at bedside and states he will get dose of Robinul for pt.

## 2017-12-03 NOTE — Progress Notes (Signed)
RT called again by Monica Becton, RN for pt "asking for BIPAP."  RT asked if pt was in distress and if BiPAP is indicated and was told "well there is an order."  RT asked to speak with charge RN who went in to look at patient.  Patient told charge RN (with RT on the phone hearing response) that he did not want the BiPAP mask and was not having trouble breathing.  Sister at bedside overheard saying "he was just coughing a lot so we thought he needed the mask."  RN reiterated to sister that BiPAP with coughing is not indicated unless pt gets into distress.  RT suggested cough medicine at this time.  Per Charge RN SpO2 good and pt is not in distress at this time.

## 2017-12-03 NOTE — Progress Notes (Signed)
Family Medicine Teaching Service Daily Progress Note Intern Pager: [phone]  Patient name: Levi Brown Medical record number: [id number] Date of birth: [birth year] Age: 61 y.o. Gender: male  Primary Care Provider: [name], FNP Consultants: CCM Code Status: Full  Pt Overview and Major Events to Date:  Levi Brown is a 61 y.o. male presenting with shortness of breath . PMH is significant for non-small cell lung cancer diagnosed in July 2018, HTN, hx tobacco abuse, hx alcohol abuse, COPD  Assessment and Plan:  NSCLC: Chronic. Worsening. Patient reports being dianosed on July 23rd, 2018. He is following with Dr. Inda Merlin with oncology. - currently had the maximum radiation treatment, oncology note says chemotherapy will not help him at this point.  - Palliative has been consulted; appreciate their recs and assistance with this patient - Oncology will see patient on Monday if he is still here, if patient is discharged he will be seen outpatient. - He is not completely palliative but family is pursing comfort care measures and focusing on that. - BiPAP for comfort - Morphine and ativan prn for comfort measures  Atrial Flutter: Resolved. Converted amiodarone. Currently still intermittently tachycardic. - Continue telemetry - Continue amiodarone 200mg  oral  COPD: Chronic. 30 pack year history, quit smoking 4 years ago. Patient states he has inhalers at home but he has not needed them recently. He is on 2L O2 at home. Requiring 8L O2 currently. -albuterol nebs PRN -duonebs q4 PRN -cont to monitor pulse ox -discont zosyn; convert to Augmentin 875-125mg  BID  Severe Protein Calorie Malnutrition: Patient has [place] and has been requiring boost supplementation. His appetite has been normal but he has been unable to eat as much due to early satiety. Probably due to radiation therapy for [place]. - cont boost supplements that he has been eating at home.  FEN/GI: Reg diet with  Boost supplementation PPx: SCDs  Disposition: hospice vs home care  Subjective:  Levi Brown states he is feeling better today and feels like his breathing is easier. He is considering getting chemotherapy and I have discussed with the patient that he will need to speak with oncology about his options. He is pleasant and in good spirits but his prognosis is poor. Patient is somewhat hopeful that he may get better but it is unclear that he understands how poor his prognosis truly is after having it explained to him.  Objective: Temp:  [98.8 F (37.1 C)-99.2 F (37.3 C)] 98.8 F (37.1 C) (12/20 2300) Pulse Rate:  [104-114] 104 (12/21 0400) Resp:  [17-29] 20 (12/21 0400) BP: (100-128)/(74-99) 103/81 (12/21 0400) SpO2:  [100 %] 100 % (12/21 0400) FiO2 (%):  [50 %] 50 % (12/21 0302) Physical Exam: General: Ill-appearing male, A&O x 3 Cardiovascular: Tachycardia with S3 gallop Respiratory: Increased work of breathing on 8L O2 [place], no lung sounds on the right, decreased on the left Abdomen: soft, mildly distended, non-tender, +bs Extremities: no clubbing, cyanosis, trace edema  Laboratory: Recent Labs  Lab 11/30/17 0353 12/01/17 0312 12/02/17 0350  WBC 17.7* 21.6* 15.7*  HGB 9.1* 9.2* 8.3*  HCT 28.9* 30.8* 28.5*  PLT 332 322 300   Recent Labs  Lab 11/30/17 0353 12/01/17 0312 12/02/17 0350  NA 136 132* 136  K 3.4* 4.7 3.7  CL 92* 90* 87*  CO2 36* 36* 42*  BUN 5* 11 11  CREATININE 0.59* 0.63 0.82  CALCIUM 8.9 9.0 9.1  GLUCOSE 127* 121* 93   12/20 - Magnesium: 1.9 12/20 - Phos:  4.2  Imaging/Diagnostic Tests: 12/15 - CT Angio: IMPRESSION: Large left upper lobe mass again noted, invading the mediastinum, unchanged since prior study.  There is enlarging large left effusion and new small right effusion. Only a small amount of aerated left upper lobe now present. Airspace disease within the lingula and left lower lobe could reflect atelectasis or  pneumonia.  Cardiomegaly, small pericardial effusion.  Severe attenuation/narrowing of the left upper lobe pulmonary artery by the left upper lobe mass.  No evidence of pulmonary embolus.  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 Recommend attention on follow-up imaging.  12/20 - CXR (portable 1-view): IMPRESSION: 1. Improved pulmonary edema. 2. Left mediastinal mass and volume loss.  Nuala Alpha, DO 12/03/2017, 7:34 AM PGY-1, [facility] Intern pager: [phone], text pages welcome

## 2017-12-03 NOTE — Progress Notes (Signed)
1125- RT called by Caesar, RN and asked for BiPAP for pt coming to floor.  I spoke with the sending RT who stated he has not needed nor wanted BiPAP today.  I called Caesar, RN back and instructed him to call RT once pt is on floor if he looks like he needs BiPAP at that time.  1151-  RT called by Monica Becton, RN who stated that Mr. Breeze was requesting BiPAP.  Erasmo Downer, RRT, RCP took BiPAP to bedside and pt refused BiPAP stating that he did not ask for it and doesn't need it.  BiPAP left in room at this time for future use.  1209-  RT received another call from [place], RN who stated Mr. Litt was again requesting BiPAP.  I arrived at room to observe Mr. Lamadrid sitting up in a chair with a nasal cannula in.  Mr. Piedra states that he is breathing ok and does not want BiPAP at this time.  BiPAP remains at bedside for future use.

## 2017-12-04 DIAGNOSIS — Z515 Encounter for palliative care: Secondary | ICD-10-CM

## 2017-12-04 LAB — BLOOD GAS, ARTERIAL
Acid-Base Excess: 20.3 mmol/L — ABNORMAL HIGH (ref 0.0–2.0)
Bicarbonate: 47.6 mmol/L — ABNORMAL HIGH (ref 20.0–28.0)
O2 Content: 5 L/min
O2 SAT: 84.1 %
PCO2 ART: 99.5 mmHg — AB (ref 32.0–48.0)
Patient temperature: 98.6
pH, Arterial: 7.302 — ABNORMAL LOW (ref 7.350–7.450)
pO2, Arterial: 52.3 mmHg — ABNORMAL LOW (ref 83.0–108.0)

## 2017-12-04 MED ORDER — TRAZODONE HCL 50 MG PO TABS: 25.0000 mg | ORAL_TABLET | Freq: Every evening | ORAL | Status: DC | PRN

## 2017-12-04 MED ORDER — GLYCOPYRROLATE 0.2 MG/ML IJ SOLN
0.2000 mg | Freq: Three times a day (TID) | INTRAMUSCULAR | Status: DC
  Administered 2017-12-04 – 2017-12-09 (×17): 0.2 mg via INTRAVENOUS
  Filled 2017-12-04 (×17): qty 1

## 2017-12-04 MED ORDER — LORAZEPAM 0.5 MG PO TABS: 0.5000 mg | ORAL_TABLET | ORAL | Status: DC | PRN

## 2017-12-04 MED ORDER — LORAZEPAM 2 MG/ML IJ SOLN
0.5000 mg | INTRAMUSCULAR | Status: DC | PRN
  Administered 2017-12-04 – 2017-12-05 (×4): 1 mg via INTRAVENOUS
  Administered 2017-12-05: 0.5 mg via INTRAVENOUS
  Administered 2017-12-06 – 2017-12-08 (×8): 1 mg via INTRAVENOUS
  Filled 2017-12-04 (×15): qty 1

## 2017-12-04 MED ORDER — HYDRALAZINE HCL 20 MG/ML IJ SOLN
5.0000 mg | Freq: Four times a day (QID) | INTRAMUSCULAR | Status: DC | PRN
  Administered 2017-12-04: 5 mg via INTRAVENOUS
  Filled 2017-12-04: qty 1

## 2017-12-04 NOTE — Progress Notes (Signed)
Critical Result paged to Resident - CO2 = 99.5.

## 2017-12-04 NOTE — Progress Notes (Signed)
Patient impulsiveness to get out of bed, having episodes of agitation and not able to breath per his face expression (trying to catch his breath).  Repeated commands were given to patient to stay in bed, he was trying to get his O2 [place] off and other tubings.  Patient was being helped by 3 RNs to the bedside, mittens were placed on him due to his safety.  MD was called to bedside to assess and evaluate.  Respiratory was also called in to assess patient as he was having this current episode.  Ativan  1mg  was given to relax the patient.  BIPAP was not use at this time due to safety concerns.  Patient has relaxed, RR 15, will keep monitoring the patient.   Previously patient was given Ambien for sleep, which he did relax after 30 to 40 minutes.

## 2017-12-04 NOTE — Progress Notes (Signed)
Palliative:  Called by RN for concern for symptoms and prns. Discussed management and went to bedside where Mr. Bosler was resting (I did not awaken). His girlfriend is at bedside and says that his coughing and secretions have been making him uncomfortable and that he is too weak to expectorate (so I will not add guaifenesin or flutter valve as this may just add to his discomfort). Educated about scheduled Robinul and d/c Ambien (made his confusion/agitation worse) but added trazodone 25 mg qhs prn. Also adjusted prn ativan to 0.5-1 mg IV as they may be more willing to accept a lower dose. Has not been using scheduled morphine but will leave for now. Will ask my partner [name], NP to continue discussions with patient and family when she returns tomorrow. Sister not in room at time of my visit.    15 min  Vinie Sill, NP Palliative Medicine Team Pager # [phone] (M-F 8a-5p) Team Phone # [phone] (Nights/Weekends)

## 2017-12-04 NOTE — Progress Notes (Signed)
Rt called to bedside at beginning of shift to request bipap for pt. RT from day shift had previously seen pt a couple of times and already notified nurse and charge that it wasn't indicated at the time. Pt was saturating 98% and never was in distress. RT let the nurse know that pt was not in distress and bipap was still not indicated. Pt family member left bedside and pt began to climb out of the bed and had to be put in mittens due to pt pulling lines and trying to take oxygen off. Nurse called doctors to bedside for further assessment and ativan was given and pt laid back and went to sleep. Pt is agonal breathing but maintaining saturations of 100%. RT will follow per protocol.

## 2017-12-04 NOTE — Progress Notes (Signed)
Family Medicine Teaching Service Daily Progress Note Intern Pager: [phone]  Patient name: Levi Brown Medical record number: [id number] Date of birth: 02/06/1956 Age: 61 y.o. Gender: male  Primary Care Provider: [name], FNP Consultants: CCM Code Status: Full  Pt Overview and Major Events to Date:  Levi Brown is a 61 y.o. male presenting with shortness of breath . PMH is significant for non-small cell lung cancer diagnosed in July 2018, HTN, hx tobacco abuse, hx alcohol abuse, COPD  Assessment and Plan:  Dyspnea, chronic, worsening Patient with worsening dyspnea in the past 24 hours in the setting of of terminal NSCLC. Seen by palliative team which suggested morphine for dyspnea and air hunger. Patient and family members were opposed to it overnight, leading to worsening respiratory status. Patient was also agitated overnight due to worsening respiratory status, agonal breathing noted at times also likely secondary to anxiety. Patient also was unable to use bipap. Given ativan which improve status. This morning discussed with patient need for morphine and agreeable to using on a prn basis. Palliative team also started Robinul to aid with secretion management. Patient will be transitioned from 7L [place] to bipap.  --Continue Sublingual morphine prn --Continue ativan as needed for anxiety --Bipap for support/comfort (DNR) --Continue robinul for secretion management --Continue NST per RT --ABG/VBG as needed  Insomnia Patient previoulsy on Ambien. Given confusion and AMS overnight, thought to be a contributing factor. Discuss with palliative care which discontinued it and started on trazodone. Will continue to monitor --Continue trazadone 25 mg qhs prn   Agitation/AMS Likely secondary to worsening respiratory status. Patient also receive Lorrin Mais which could be contributing to acute mental change. Hypercabia could also be contributing to mental status change.  --Continue  ativan as needed. --ABG/VBGas needed --Bipap prn  NSCLC: Chronic. Worsening. Patient reports being dianosed on July 23rd, 2018. He is following with Dr. Inda Merlin with oncology. - currently had the maximum radiation treatment, oncology note says chemotherapy will not help him at this point.  - Palliative has been consulted; appreciate their recs and assistance with this patient - Oncology will see patient on Monday if he is still here, if patient is discharged he will be seen outpatient. - He is not completely palliative but family is pursing comfort care measures and focusing on that. - BiPAP for comfort - Morphine and ativan prn for comfort measures  Atrial Flutter: Resolved. Converted amiodarone. Currently still intermittently tachycardic. - Continue telemetry - Continue amiodarone 200mg  oral  COPD: Chronic. 30 pack year history, quit smoking 4 years ago. Patient states he has inhalers at home but he has not needed them recently. He is on 2L O2 at home. Requiring 8L O2 currently. -albuterol nebs PRN -duonebs q4 PRN -cont to monitor pulse ox -discont zosyn; convert to Augmentin 875-125mg  BID  Severe Protein Calorie Malnutrition: Patient has [place] and has been requiring boost supplementation. His appetite has been normal but he has been unable to eat as much due to early satiety. Probably due to radiation therapy for [place]. - cont boost supplements that he has been eating at home.  FEN/GI: Reg diet with Boost supplementation PPx: SCDs  Disposition: hospice vs home care  Subjective:  Patient was more alert this morning than last night, though sleepy and difficult to arouse at times. Girlfriend and sister at bedside and attentive to patient need. Patient sitting up in the chair.  Objective: Temp:  [97.7 F (36.5 C)-98.2 F (36.8 C)] 97.7 F (36.5 C) (12/22 0040)  Pulse Rate:  [97-114] 101 (12/22 1619) Resp:  [13-20] 20 (12/22 1619) BP: (101-164)/(76-107) 114/78 (12/22  1324) SpO2:  [96 %-100 %] 100 % (12/22 1619) Physical Exam: General: Ill-appearing male, A&O x 3 Cardiovascular: Tachycardia with S3 gallop Respiratory: Increased work of breathing on 8L O2 [place], no lung sounds on the right, decreased on the left Abdomen: soft, mildly distended, non-tender, +bs Extremities: no clubbing, cyanosis, trace edema  Laboratory: Recent Labs  Lab 11/30/17 0353 12/01/17 0312 12/02/17 0350  WBC 17.7* 21.6* 15.7*  HGB 9.1* 9.2* 8.3*  HCT 28.9* 30.8* 28.5*  PLT 332 322 300   Recent Labs  Lab 11/30/17 0353 12/01/17 0312 12/02/17 0350  NA 136 132* 136  K 3.4* 4.7 3.7  CL 92* 90* 87*  CO2 36* 36* 42*  BUN 5* 11 11  CREATININE 0.59* 0.63 0.82  CALCIUM 8.9 9.0 9.1  GLUCOSE 127* 121* 93   12/20 - Magnesium: 1.9 12/20 - Phos: 4.2  Imaging/Diagnostic Tests: 12/15 - CT Angio: IMPRESSION: Large left upper lobe mass again noted, invading the mediastinum, unchanged since prior study.  There is enlarging large left effusion and new small right effusion. Only a small amount of aerated left upper lobe now present. Airspace disease within the lingula and left lower lobe could reflect atelectasis or pneumonia.  Cardiomegaly, small pericardial effusion.  Severe attenuation/narrowing of the left upper lobe pulmonary artery by the left upper lobe mass.  No evidence of pulmonary embolus.  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 Recommend attention on follow-up imaging.  12/20 - CXR (portable 1-view): IMPRESSION: 1. Improved pulmonary edema. 2. Left mediastinal mass and volume loss.  [name], MD 12/04/2017, 6:15 PM PGY-1, [place] Intern pager: [phone], text pages welcome

## 2017-12-04 NOTE — Progress Notes (Signed)
Patient seen this morning with his girlfriend by his bedside. He was unable to communicate with me. Seems to be in moderate respiratory distress. He was agitated over the night but seems be better now.  Exam: Gen: Propped up in bed. HEENT: EOMI, PERRLA. Neuro: Awake and alert, unable to assess orientation. Heart: S1 S2 normal,rapid rate and normal rhythm. No murmur. Lungs: In respiratory distress using accessory muscles of respiration, on 5 L O2 via [place]. Sounds coarse. Air entry equal. Abd: Soft, NT/ND, BS+ and normal. Ext: No edema.  A/P; 1. Acute Hypoxic/Hypercapneic respiratory failure: S/P intubation and extubation. He has post-obstructive pneumonia per pulm.     Currently on 5 L O2, might need to go up on this or place on BiPAP for comfort.     Continue A/B treatment.     Palliative to reassess and meet with family to readdress goals of care.     He is currently DNI/DNR. ?? Need for full comfort care.  2. COPD: Likely contributing to above.     Continue current treatment regimen and monitor for improvement.  3. Afib: Continue Amiodarone.

## 2017-12-04 NOTE — Evaluation (Signed)
Clinical/Bedside Swallow Evaluation Patient Details  Name: Levi Brown MRN: [id number] Date of Birth: 04/10/1956  Today's Date: 12/04/2017 Time: SLP Start Time (ACUTE ONLY): 2505 SLP Stop Time (ACUTE ONLY): 1545 SLP Time Calculation (min) (ACUTE ONLY): 15 min  Past Medical History:  Past Medical History:  Diagnosis Date  . Former smoker    Quit smoking in early 2017  . Non-small cell cancer of left lung ([place]) 07/13/2017  . Stroke [facility]) 07/07/2017   [place] - pt unaware, but imaging suggests old strokes   Past Surgical History:  Past Surgical History:  Procedure Laterality Date  . VIDEO BRONCHOSCOPY Bilateral 07/07/2017   Procedure: VIDEO BRONCHOSCOPY WITHOUT FLUORO;  Surgeon: Collene Gobble, MD;  [facility] END[facility]COPY;  Service: Cardiopulmonary;  Laterality: Bilateral;   HPI:  61 y.o.malewith past medical history of stroke, COPD, non-small cell lung cancer diagnosed in July 2018, home oxygen 2L, ETOH, and past smokeradmitted on 12/13/2018with shortness of breath.Followed by Dr. 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 Patient was considering starting palliative chemotherapy. This hospitalization, found to have post-obstructive pneumonia and receiving IV antibiotics. Required intubation 12/15 for worsening respiratory distress. Extubated 12/18. Patient decompensated 12/19 AM requiring BiPAP. Patient and family determined goals of DNR/DNI and comfort care. Palliative following.    Assessment / Plan / Recommendation Clinical Impression  Patient presents with moderate risk for aspiration in the setting of decreased respiratory status, PNA, s/p 3 day intubation. His voice is aphonic and cough weak, concerning for decreased airway protection. Per RN, pt became SOB after eating jello. He presents with multiple swallows, delayed and immediate throat clearing following thin and nectar thick liquids, respectively, suggestive of aspiration. No overt signs of aspiration with puree.  Discussed goals of care with family as well as MBS to aid in determining safest, least restrictive diet for pt. Pending instrumental assessment, recommend NPO with exception of crushed meds in puree, may have ice chips, sips of water after oral care as long as he remains free of respiratory distress. Will follow up for MBS next date. SLP Visit Diagnosis: Dysphagia, unspecified (R13.10)    Aspiration Risk  Moderate aspiration risk    Diet Recommendation NPO except meds;Ice chips PRN after oral care;Free water protocol after oral care   Liquid Administration via: Cup Medication Administration: Crushed with puree Supervision: Full supervision/cueing for compensatory strategies Compensations: Slow rate;Small sips/bites    Other  Recommendations Oral Care Recommendations: Oral care QID;Oral care prior to ice chip/H20 Other Recommendations: Have oral suction available   Follow up Recommendations Other (comment)(tbd)      Frequency and Duration            Prognosis Prognosis for Safe Diet Advancement: Good Barriers to Reach Goals: Other (Comment)(overall medical prognosis)      Swallow Study   General Date of Onset: 11/29/2017 HPI: 61 y.o.malewith past medical history of stroke, COPD, non-small cell lung cancer diagnosed in July 2018, home oxygen 2L, ETOH, and past smokeradmitted on 12/14/2018with shortness of breath.Followed by Dr. 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 Patient was considering starting palliative chemotherapy. This hospitalization, found to have post-obstructive pneumonia and receiving IV antibiotics. Required intubation 12/15 for worsening respiratory distress. Extubated 12/18. Patient decompensated 12/19 AM requiring BiPAP. Patient and family determined goals of DNR/DNI and comfort care. Palliative following.  Type of Study: Bedside Swallow Evaluation Previous Swallow Assessment: none in chart Diet Prior to this Study: Regular;Thin liquids Temperature  Spikes Noted: No Respiratory Status: Nasal cannula History of Recent Intubation:  Yes Length of Intubations (days): 3 days Date extubated: 11/30/17 Behavior/Cognition: Alert;Cooperative Oral Cavity Assessment: Within Functional Limits Oral Care Completed by SLP: Yes Oral Cavity - Dentition: Missing dentition;Poor condition Vision: Functional for self-feeding Self-Feeding Abilities: Able to feed self Patient Positioning: Upright in chair Baseline Vocal Quality: Aphonic;Low vocal intensity Volitional Cough: Weak Volitional Swallow: Unable to elicit    Oral/Motor/Sensory Function Overall Oral Motor/Sensory Function: Within functional limits   Ice Chips Ice chips: Within functional limits   Thin Liquid Thin Liquid: Impaired Presentation: Cup Pharyngeal  Phase Impairments: Suspected delayed Swallow;Multiple swallows;Throat Clearing - Delayed    Nectar Thick Nectar Thick Liquid: Impaired Presentation: Cup Pharyngeal Phase Impairments: Throat Clearing - Immediate   Honey Thick Honey Thick Liquid: Not tested   Puree Puree: Within functional limits   Solid   GO   Deneise Lever, [place], CCC-SLP Speech-Language Pathologist [phone] Solid: Not tested        Aliene Altes 12/04/2017,4:19 PM

## 2017-12-04 NOTE — Plan of Care (Signed)
CO2 on Venous Gas = 99.5. Placed back on BiPap. Robinol given as scheduled dose.

## 2017-12-04 NOTE — Progress Notes (Addendum)
Paged by patient's nurse and RT about patient having increased agitation and spells where he tries to get out of bed and will not respond to questions verbally. Family previously very intent for patient to try bipap, but patient refusing today when he was more alert. He now is requiring mitts for agitation, so bipap not an option. Patient's family did not want morphine used when visiting earlier today. Attempted to reach sister [name], but phone number listed goes straight to a mailbox option without voicemail. Reached cousin Langley Gauss at number for patient's uncle Trilby Drummer who will try to get contact information. Asked that I call back shortly. Want to discuss use of morphine for air hunger. Patient saturating 100% on 7L [place] but having retractions and is agitated. Coarse breath sounds at bases of posterior lung fields with poor air movement and belly breathing. Mildly tachycardic. Gave 1 mg IV ativan with improvement in agitation. Patient repositioned in bed. Last EKG showed QTc in 490s, so use of agent like haldol would need to be done sparingly. Would prefer continued use of prn ativan. Will reach back out to cousin.   Called [place] back at home number listed for [place]. She has been unable to get in contact with any family members. Provided number for nurse's station. Stated our team would like to discuss use of medications to make his breathing more comfortable.   Olene Floss, MD [place], PGY-3

## 2017-12-05 ENCOUNTER — Inpatient Hospital Stay (HOSPITAL_COMMUNITY): Payer: Medicaid Other

## 2017-12-05 DIAGNOSIS — J9621 Acute and chronic respiratory failure with hypoxia: Secondary | ICD-10-CM

## 2017-12-05 DIAGNOSIS — K5900 Constipation, unspecified: Secondary | ICD-10-CM | POA: Diagnosis present

## 2017-12-05 MED ORDER — RESOURCE THICKENUP CLEAR PO POWD
ORAL | Status: DC | PRN
  Filled 2017-12-05 (×2): qty 125

## 2017-12-05 MED ORDER — BISACODYL 5 MG PO TBEC: 5.0000 mg | DELAYED_RELEASE_TABLET | Freq: Every day | ORAL | Status: DC | PRN

## 2017-12-05 MED ORDER — SENNA 8.6 MG PO TABS
2.0000 | ORAL_TABLET | Freq: Once | ORAL | Status: AC
  Administered 2017-12-05: 17.2 mg via ORAL
  Filled 2017-12-05: qty 2

## 2017-12-05 MED ORDER — BISACODYL 10 MG RE SUPP
10.0000 mg | Freq: Every day | RECTAL | Status: DC | PRN
  Administered 2017-12-07: 10 mg via RECTAL
  Filled 2017-12-05: qty 1

## 2017-12-05 MED ORDER — SENNOSIDES-DOCUSATE SODIUM 8.6-50 MG PO TABS
2.0000 | ORAL_TABLET | Freq: Two times a day (BID) | ORAL | Status: DC
  Administered 2017-12-05 – 2017-12-07 (×4): 2 via ORAL
  Filled 2017-12-05 (×5): qty 2

## 2017-12-05 NOTE — Addendum Note (Signed)
Addended by: [name] on: 12/05/2017 05:11 PM   Modules accepted: Level of Service

## 2017-12-05 NOTE — Plan of Care (Signed)
Swallow study done today. Dysphagia diet ordered.

## 2017-12-05 NOTE — Progress Notes (Signed)
Family Medicine Teaching Service Daily Progress Note Intern Pager: [phone]  Patient name: Levi Brown Medical record number: [id number] Date of birth: 01/12/1956 Age: 61 y.o. Gender: male  Primary Care Provider: [name], FNP Consultants: CCM (signed off) Code Status: Full  Pt Overview and Major Events to Date:  Levi Brown is a 61 y.o. male presenting with shortness of breath . PMH is significant for non-small cell lung cancer diagnosed in July 2018, HTN, hx tobacco abuse, hx alcohol abuse, COPD  Assessment and Plan:  Dyspnea 2/2 terminal NSCLC, worsening. Acutely worsening respiratory status this admit. Of note, NSCLC is recent diagnosis on 07/05/17. Requiring morphine for dyspnea and air hunger and prn BiPAP. Family members have been resistant to moving to full comfort care but are focusing more on comfort for patient. Followed by oncology Dr. Inda Merlin, had the maximum radiation treatment and not candidate for chemotherapy. -Palliative following, appreciate recommendations -Oncology will see patient on Monday if he is still here, if patient is discharged he will be seen outpatient. -Continue Sublingual morphine prn -Continue ativan as needed for anxiety -Bipap prn for support/comfort (DNR)  - Morphine and ativan prn for comfort measures -Continue robinul for secretion management -Continue NST per RT -ABG/VBG as needed  Insomnia Patient previoulsy on Ambien. Given confusion and AMS overnight, thought to be a contributing factor. Discuss with palliative care which discontinued it and started on trazodone. Will continue to monitor -Continue trazadone 25 mg qhs prn   Agitation/AMS Likely secondary to worsening respiratory status. Hypercabia could also be contributing to mental status change.  -Continue ativan as needed. -ABG/VBGas needed -Bipap prn  Atrial Flutter: Currently still intermittently tachycardic. - Continue telemetry - Continue amiodarone 200mg   oral  COPD, ?HCAP: Chronic. 30 pack year history, quit smoking 4 years ago. He is on 2L O2 at home. Requiring 8L O2 currently. Treated for a possible post obstructive HCAP per pulm note on 12/15. -albuterol nebs PRN -duonebs q4 PRN -cont to monitor pulse ox -s/p Zosyn (12/16-12/21), Augmentin 875-125mg  BID (12/21-12/23), today is day 8 of ABX. Stop ABX today as has more than completed 7 day course.  Severe Protein Calorie Malnutrition: Patient has [place] and has been requiring boost supplementation. His appetite has been normal but he has been unable to eat as much due to early satiety. Probably due to radiation therapy for [place]. -cont boost supplements that he has been eating at home.  Constipation, resolved with enema today -senna-docusate BID -dulcolax prn  FEN/GI: Dysphagia 2 diet PPx: none, transitioning to comfort  Disposition: hospice vs home care  Subjective:  Shakes his head no when asked if he has any pain or trouble breathing or any concerns this morning. Sleepy but easily aroused and responding appropriately  Objective: Temp:  [97.4 F (36.3 C)-98.1 F (36.7 C)] 97.8 F (36.6 C) (12/23 0801) Pulse Rate:  [101-112] 109 (12/23 0733) Resp:  [17-21] 17 (12/23 0420) BP: (93-155)/(72-97) 125/87 (12/23 0733) SpO2:  [98 %-100 %] 100 % (12/23 0801) FiO2 (%):  [50 %] 50 % (12/23 0420) Weight:  [73.3 kg (161 lb 9.6 oz)] 73.3 kg (161 lb 9.6 oz) (12/23 0422) Physical Exam:  General: Ill-appearing male, in NAD Cardiovascular: Tachycardia with S3 gallop Respiratory: Increased work of breathing on 8L O2 [place], no lung sounds on the right, decreased on the left Abdomen: soft, mildly distended, non-tender, +bs Extremities: no clubbing, cyanosis, trace edema  Laboratory: Recent Labs  Lab 11/30/17 0353 12/01/17 0312 12/02/17 0350  WBC 17.7* 21.6*  15.7*  HGB 9.1* 9.2* 8.3*  HCT 28.9* 30.8* 28.5*  PLT 332 322 300   Recent Labs  Lab 11/30/17 0353 12/01/17 0312  12/02/17 0350  NA 136 132* 136  K 3.4* 4.7 3.7  CL 92* 90* 87*  CO2 36* 36* 42*  BUN 5* 11 11  CREATININE 0.59* 0.63 0.82  CALCIUM 8.9 9.0 9.1  GLUCOSE 127* 121* 93   12/20 - Magnesium: 1.9 12/20 - Phos: 4.2  Imaging/Diagnostic Tests: 12/15 - CT Angio: IMPRESSION: Large left upper lobe mass again noted, invading the mediastinum, unchanged since prior study.  There is enlarging large left effusion and new small right effusion. Only a small amount of aerated left upper lobe now present. Airspace disease within the lingula and left lower lobe could reflect atelectasis or pneumonia.  Cardiomegaly, small pericardial effusion.  Severe attenuation/narrowing of the left upper lobe pulmonary artery by the left upper lobe mass.  No evidence of pulmonary embolus.  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 Recommend attention on follow-up imaging.  12/20 - CXR (portable 1-view): IMPRESSION: 1. Improved pulmonary edema. 2. Left mediastinal mass and volume loss.  Bufford Lope, DO 12/05/2017, 10:22 AM PGY-2, [place] Intern pager: [phone], text pages welcome

## 2017-12-05 NOTE — Progress Notes (Signed)
Daily Progress Note   Patient Name: Levi Brown       Date: 12/05/2017 DOB: 1956-07-22  Age: 61 y.o. MRN#: [id number] Attending Physician: Kinnie Feil, MD Primary Care Physician: [name], FNP Admit Date: 11/19/2017  Reason for Consultation/Follow-up: Establishing goals of care, Non pain symptom management and Terminal Care  Subjective: Patient very drowsy this afternoon. He will wake to voice and answer some questions. Does not participate in conversation with friend, Levi Brown, who is at bedside. Denies pain or dyspnea during my visit. C/o abdominal discomfort and feels like he could have another BM.    GOC:   9842: JIZXY with good friend, Levi Brown who had questions/concerns. Levi Brown and the patient dated "on and off" for many years. Levi Brown told Timmothy Sours she will be here with him, which he appreciates. In the hallway, she tells me the sister are in "denial" and continue to give Timmothy Sours "false hope." Levi Brown is concerned about discharge including where he will go and who will take care of him. I reassured Levi Brown that I would continue discussions with family.   1535: F/u this afternoon with sister, Levi Brown who I met with last Thursday. Mr. Schoenfelder was recently given ativan and resting comfortably. We discussed course of hospitalization and how he did over the weekend. Celeste feels the ativan is helping. She is still resistant to him receiving low dose morphine for dyspnea. We again discussed how he will not be able to discharge with BiPAP and the need to treat symptoms with medications.   Levi Brown asks "what is he a candidate for?" in regards to oncology treatments. I explained that he has completed radiation and his functional status inhibits him from being a candidate for chemotherapy. Explained  focus on comfort and symptom management when we can no longer 'aggressively' manage with medical interventions.   Levi Brown is a very spiritual individual and struggles with accepting poor prognosis because of this. She repeatedly speaks of him being healed and "in the Lord's hands."    I questioned Celeste on where we go after this hospitalization? Also, if she gave any more thought to hospice services after our last conversation. Explained concern that he will not be able to participate in aggressive therapy at SNF because of his dyspnea on exertion. Celeste states "we will cross that bridge when it  comes" and "he's not finished yet." Explained that antibiotics have been transitioned to PO and that we will be faced with discharge decisions soon.   Reassured of continued support from palliative through hospitalization. Emotional/spiritual support provided.    Length of Stay: 9  Current Medications: Scheduled Meds:  . amiodarone  200 mg Oral Daily  . chlorhexidine  15 mL Mouth Rinse BID  . feeding supplement (PRO-STAT SUGAR FREE 64)  30 mL Oral BID  . glycopyrrolate  0.2 mg Intravenous TID  . mouth rinse  15 mL Mouth Rinse q12n4p  . morphine CONCENTRATE  5 mg Sublingual Q8H  . pantoprazole (PROTONIX) IV  40 mg Intravenous Q24H  . senna  2 tablet Oral Once  . senna-docusate  2 tablet Oral BID  . sodium chloride flush  10-40 mL Intracatheter Q12H    Continuous Infusions: . sodium chloride      PRN Meds: Place/Maintain arterial line **AND** sodium chloride, acetaminophen (TYLENOL) oral liquid 160 mg/5 mL, albuterol, atropine, bisacodyl, hydrALAZINE, Influenza vac split quadrivalent PF, LORazepam **OR** LORazepam, morphine injection, morphine CONCENTRATE, sodium chloride flush, traZODone  Physical Exam  Constitutional: He is easily aroused. He appears ill.  HENT:  Head: Normocephalic and atraumatic.  Cardiovascular: Regular rhythm.  Pulmonary/Chest: No accessory muscle usage. No  tachypnea. No respiratory distress.  [place]  Abdominal: He exhibits distension. There is no tenderness.  Neurological: He is alert and easily aroused.  Drowsy, periods of confusion  Skin: Skin is warm and dry.  Psychiatric: His speech is delayed. He is inattentive.  Nursing note and vitals reviewed.          Vital Signs: BP 122/74 (BP Location: Left Arm)   Pulse (!) 111   Temp 97.9 F (36.6 C) (Oral)   Resp 17   Ht '5\' 6"'$  (1.676 m)   Wt 73.3 kg (161 lb 9.6 oz)   SpO2 100%   BMI 26.08 kg/m  SpO2: SpO2: 100 % O2 Device: O2 Device: Nasal Cannula O2 Flow Rate: O2 Flow Rate (L/min): 4 L/min  Intake/output summary:   Intake/Output Summary (Last 24 hours) at 12/05/2017 1645 Last data filed at 12/05/2017 0900 Gross per 24 hour  Intake 60 ml  Output 550 ml  Net -490 ml   LBM: Last BM Date: 12/08/2017 Baseline Weight: Weight: 72.6 kg (160 lb 0.9 oz) Most recent weight: Weight: 73.3 kg (161 lb 9.6 oz)       Palliative Assessment/Data: PPS 40%   Flowsheet Rows     Most Recent Value  Intake Tab  Referral Department  Critical care  Unit at Time of Referral  ICU  Palliative Care Primary Diagnosis  Cancer  Date Notified  11/30/17  Palliative Care Type  New Palliative care  Reason for referral  Clarify Goals of Care  Date of Admission  11/16/2017  Date first seen by Palliative Care  12/01/17  # of days Palliative referral response time  1 Day(s)  # of days IP prior to Palliative referral  4  Clinical Assessment  Palliative Performance Scale Score  40%  Psychosocial & Spiritual Assessment  Palliative Care Outcomes  Patient/Family meeting held?  Yes  Who was at the meeting?  patient and sister Levi Brown)   Palliative Care Outcomes  Clarified goals of care, Provided end of life care assistance, Provided psychosocial or spiritual support, ACP counseling assistance, Improved pain interventions, Improved non-pain symptom therapy, Counseled regarding hospice      Patient Active Problem  List   Diagnosis Date Noted  .  Palliative care encounter   . Acute on chronic respiratory failure ([place])   . Pneumonia due to infectious organism   . Shortness of breath   . Acute respiratory failure with hypercapnia ([place])   . HCAP (healthcare-associated pneumonia)   . Atrial flutter ([place]) 12/02/2017  . Atrial fibrillation with RVR ([place])   . Arterial hypotension   . Alcohol abuse   . Palliative care by specialist   . Goals of care, counseling/discussion   . Edema 08/31/2017  . Hypokalemia 08/29/2017  . Acute respiratory failure with hypoxia and hypercapnia ([place]) 08/26/2017  . Pleural effusion on left 08/26/2017  . Collapse of left lung 08/26/2017  . Hyponatremia 08/26/2017  . COPD exacerbation ([place]) 08/26/2017  . Obstructive pneumonia 08/26/2017  . Non-small cell carcinoma of lung ([place])   . Non-small cell cancer of left lung ([place]) 07/13/2017  . Elevated blood pressure reading 07/05/2017    Palliative Care Assessment & Plan   Patient Profile:  61 y.o. male  with past medical history of stroke, COPD, non-small cell lung cancer diagnosed in July 2018, home oxygen 2L, ETOH, and past smoker admitted on 11/25/2017 with shortness of breath. Followed by Dr. 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 Patient was considering starting palliative chemotherapy. This hospitalization, found to have post-obstructive pneumonia and receiving IV antibiotics. Required intubation 12/15 for worsening respiratory distress. Extubated 12/18. Patient decompensated 12/19 AM requiring BiPAP. PCCM met with family for [place]. Temporary intubation versus comfort. Patient and family determined goals of DNR/DNI and comfort care. Palliative medicine consultation placed.   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   Scheduled Senna 2tab BID. Dulcolax suppository prn.  Encourage prn morphine for dyspnea over BIPAP use.   Sisters struggle with accepting poor prognosis and full comfort approach. Recommended hospice services on discharge. Sister not ready to make this decision yet.   Possible oncology visit tomorrow? This may help family with decision making.   PMT will continue to support patient/family through hospitalization.   Code Status: DNR/DNI   Code Status Orders  (From admission, onward)        Start     Ordered   12/01/17 1000  Do not attempt resuscitation (DNR)  Continuous    Question Answer Comment  Maintain current active treatments No   Do not initiate new interventions Yes      12/01/17 0959    Code Status History    Date Active Date Inactive Code Status Order ID Comments User Context   11/30/2017 09:51 12/01/2017 09:59 Partial Code [id number]  Rush Farmer, MD Inpatient   12/01/2017 17:53 11/30/2017 09:51 Full Code [id number]  Steve Rattler, DO Inpatient   08/26/2017 11:03 09/04/2017 17:12 DNR [id number]  Juanito Doom, MD Inpatient   08/26/2017 00:24 08/26/2017 11:03 Full Code [id number]  Aldean Jewett, MD ED   07/05/2017 22:13 07/08/2017 20:30 Full Code [id number]  Ivor Costa, MD ED       Prognosis:   Unable to determine: guarded with acute on chronic respiratory failure secondary to NSCLC, obstructing mediastinal mass, and post obstructive pneumonia. High risk for respiratory decompensation.   Discharge Planning:  To Be Determined  Care plan was discussed with patient, friend Levi Brown), sister Levi Brown), RN, Attending  Thank you for allowing the Palliative Medicine Team to  assist in the care of this patient.   Time In: 1230- 1535 Time Out: 1315 1600 Total Time 26mn Prolonged Time Billed yes      Greater than 50%  of this time was spent counseling and coordinating care related to the above  assessment and plan.  MIhor Dow FNP-C Palliative Medicine Team  Phone: [phone]: [phone] Please contact Palliative Medicine Team phone at [phone] questions and concerns.

## 2017-12-05 NOTE — Progress Notes (Signed)
Modified Barium Swallow Progress Note  Patient Details  Name: Levi Brown MRN: [id number] Date of Birth: Dec 24, 1955  Today's Date: 12/05/2017  Modified Barium Swallow completed.  Full report located under Chart Review in the Imaging Section.  Brief recommendations include the following:  Clinical Impression  Patient presents with moderate oral and mild pharyngeal dysphagia due to cognitive/sensory deficits. Oral stage characterized by bolus holding, prolonged bolus formation, and significantly delayed anterior to posterior transit. Premature spillage of liquids to pyriform sinuses before the swallow. This resulted in 2 instances of aspiration with thin liquids which spilled into the airway prior to laryngeal closure and were aspirated during the swallow. Pt sensed aspiration events, however cough is weak and was not protective. Swallow initiation timely at the base of tongue/valleculae for solids. Trace, shallow penetration of larger sip of nectar thick liquid x1, otherwise no aspiration or other penetration even with challenging of consecutive straw sips of nectar. With solids, pt required several minutes for mastication and oral transit. Mild vallecular residue clears with wash of nectar thick liquid. Will initiate dys 2 with nectar thick liquids, meds crushed in applesauce. Supervision/assistance for slow rate, small bites and sips. If pt exhibiting difficulty with solids, may downgrade to dys 1 to reduce work of eating. Suspect pt's dysphagia is more likely chronic than related to intubation, however given pt's respiratory status, acute illness and weak cough post-extubation, short-term modification of liquids to nectar recommended. Will follow for tolerance at bedside and for advancement of liquids pending improvements in cough strength and respiratory status.   Swallow Evaluation Recommendations       SLP Diet Recommendations: Dysphagia 2 (Fine chop) solids;Nectar thick liquid   Liquid  Administration via: Cup;Straw   Medication Administration: Crushed with puree   Supervision: Staff to assist with self feeding;Full supervision/cueing for compensatory strategies   Compensations: Slow rate;Small sips/bites       Oral Care Recommendations: Oral care BID   Other Recommendations: Have oral suction available  Deneise Lever, [place], [place] Speech-Language Pathologist [phone]  Aliene Altes 12/05/2017,1:40 PM

## 2017-12-05 NOTE — Progress Notes (Signed)
Tap water enema given and pt. Now on bedpan. Pt tolerated enema fairly well and started to have green liquid in bedpan as soon as enema finished. Pt states that he feels much better now, will continue to monitor. B/P elevated along with HR but gave his 5 mg S/L Morphine as per order, will continue to monitor.

## 2017-12-05 NOTE — Plan of Care (Signed)
Pt was medicated with 0.5 mg IV Ativan for agitation and some combativeness while doing bedside report and trying to assist pt from his chair to his bed. Pt unable to understand situation and where he is, just his name and that it's Christmas time, attempted to reorient but pt becoming apoxic from dropping O2 sats, will place on 4L N/C while attempting to get him back to bed and after Ativan given will place back on Bipap and continue to monitor. Pt has bed alarm on sensitive setting to help keep him safe, will closely monitor.

## 2017-12-05 NOTE — Progress Notes (Signed)
Pt woke up and was trying to get OOB, bed alarm went off. Pt irritated and now C/O abdomen hurting. Pt's abdomen is very taut at this time. MD paged and called right back, will give a tap water enema since he's NPO for now, will continue to monitor.

## 2017-12-06 DIAGNOSIS — F411 Generalized anxiety disorder: Secondary | ICD-10-CM

## 2017-12-06 DIAGNOSIS — C3412 Malignant neoplasm of upper lobe, left bronchus or lung: Secondary | ICD-10-CM

## 2017-12-06 MED ORDER — PANTOPRAZOLE SODIUM 40 MG PO TBEC
40.0000 mg | DELAYED_RELEASE_TABLET | Freq: Every day | ORAL | Status: DC
  Administered 2017-12-06: 40 mg via ORAL
  Filled 2017-12-06: qty 1

## 2017-12-06 NOTE — Progress Notes (Signed)
PROGRESS NOTE   Maintaining NSR,  Amiodarone to 200 mg daily, and tolerating well.  Please call if questions or concerns.

## 2017-12-06 NOTE — Progress Notes (Signed)
Family Medicine Teaching Service Daily Progress Note Intern Pager: [phone]  Patient name: Levi Brown Medical record number: [id number] Date of birth: Apr 11, 1956 Age: 61 y.o. Gender: male  Primary Care Provider: [name], FNP Consultants: CCM (signed off) Code Status: Full  Pt Overview and Major Events to Date:  [name] is a 61 y.o. male presenting with shortness of breath . PMH is significant for non-small cell lung cancer diagnosed in July 2018, HTN, hx tobacco abuse, hx alcohol abuse, COPD  Assessment and Plan:  Dyspnea 2/2 terminal NSCLC, worsening. Acutely worsening respiratory status this admit with agonal breathing. Requiring morphine for dyspnea and air hunger and prn BiPAP. Sister has been resistant to moving to full comfort care. Followed by oncology Dr. Inda Merlin, had the maximum radiation treatment and not candidate for chemotherapy. -Palliative following, encourage prn morphine over BiPAP. Sister struggling with accepting poor prognosis. -Oncology to see patient today. -Continue Sublingual morphine prn -Continue ativan as needed for anxiety -Bipap prn for support/comfort (DNR)  -Morphine and ativan prn for comfort measures -Continue robinul for secretion management  Insomnia. Patient previously on Ambien which caused worsening confusion and AMS.  -Avoid ambien -Continue trazadone 25 mg qhs prn per palliative  Agitation/AMS Likely secondary to worsening respiratory status. -Continue ativan as needed. -Bipap prn  Atrial Flutter: Currently still intermittently tachycardic HR to 110s - Continue amiodarone 200mg  oral  COPD, ?HCAP: Chronic. 30 pack year history, quit 4 years ago. Requiring 8L O2 currently from baseline 2L. Completed treatment for a possible post obstructive HCAP. -albuterol nebs PRN -duonebs q4 PRN -cont to monitor pulse ox -s/p Zosyn (12/16-12/21), Augmentin 875-125mg  BID (12/21-12/23), has more than completed 7 day  course.  Severe Protein Calorie Malnutrition: Patient has [place] and has been requiring boost supplementation.  -cont boost supplements  Constipation, resolved with enema -senna-docusate BID -dulcolax prn  FEN/GI: Dysphagia 2 diet PPx: none, transitioning to comfort  Disposition: hospice vs home care  Subjective:  No acute events overnight. This morning shook his head no when asked if he was in any pain.   Objective: Temp:  [97.6 F (36.4 C)-98.6 F (37 C)] 97.6 F (36.4 C) (12/24 0821) Pulse Rate:  [103-117] 103 (12/24 0821) Resp:  [15-16] 15 (12/24 0350) BP: (112-135)/(74-88) 132/85 (12/24 0821) SpO2:  [99 %-100 %] 99 % (12/24 0821) Weight:  [72.5 kg (159 lb 13.3 oz)] 72.5 kg (159 lb 13.3 oz) (12/24 0350) Physical Exam:  General: Ill-appearing male, laying in bed with eyes closed Cardiovascular: Tachycardia with S3 gallop Respiratory: agonal breathing on  4L via nasal cannula, no lung sounds on the right, decreased on the left Abdomen: soft, nondistended, non-tender, +bs Extremities: no LE edema  Laboratory: Recent Labs  Lab 11/30/17 0353 12/01/17 0312 12/02/17 0350  WBC 17.7* 21.6* 15.7*  HGB 9.1* 9.2* 8.3*  HCT 28.9* 30.8* 28.5*  PLT 332 322 300   Recent Labs  Lab 11/30/17 0353 12/01/17 0312 12/02/17 0350  NA 136 132* 136  K 3.4* 4.7 3.7  CL 92* 90* 87*  CO2 36* 36* 42*  BUN 5* 11 11  CREATININE 0.59* 0.63 0.82  CALCIUM 8.9 9.0 9.1  GLUCOSE 127* 121* 93    Imaging/Diagnostic Tests: 12/15 - CT Angio: IMPRESSION: Large left upper lobe mass again noted, invading the mediastinum, unchanged since prior study.  There is enlarging large left effusion and new small right effusion. Only a small amount of aerated left upper lobe now present. Airspace disease within the lingula and left  lower lobe could reflect atelectasis or pneumonia.  Cardiomegaly, small pericardial effusion.  Severe attenuation/narrowing of the left upper lobe pulmonary  artery by the left upper lobe mass.  No evidence of pulmonary embolus.  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 Recommend attention on follow-up imaging.  12/20 - CXR (portable 1-view): IMPRESSION: 1. Improved pulmonary edema. 2. Left mediastinal mass and volume loss.  Bufford Lope, DO 12/06/2017, 8:38 AM PGY-2, [facility] Intern pager: [phone], text pages welcome

## 2017-12-06 NOTE — Plan of Care (Signed)
Reviewed situation surrounding patient's admission and tried to provide reassurance and comfort. Pt was very anxious and agitated, HR and B/P elevated, will continue to give him Ativan IV and Morphine as ordered and continue to monitor.

## 2017-12-06 NOTE — Progress Notes (Signed)
  Speech Language Pathology Treatment: Dysphagia  Patient Details Name: Levi Brown MRN: [id number] DOB: September 10, 1956 Today's Date: 12/06/2017 Time: 8250-5397 SLP Time Calculation (min) (ACUTE ONLY): 22 min  Assessment / Plan / Recommendation Clinical Impression  Pt seen for dysphagia treatment to assess tolerance of dys 2 with nectar thick liquids and for pt/family education after MBS yesterday. Pt just waking up, sister and girlfriend at bedside. SLP educated re: recommendations and rationale for diet modifications; family verbalizes understanding and agreement. Pt appears more lethargic today; he is requiring occasional cues to remain alert. Demonstrated assisting pt with self-feeding to improve tactile feedback. Oral holding continues, and pt requiring extended time, multiple swallows to clear oral cavity of dys 2 solids. Timing improves with puree texture. No overt signs of aspiration noted. As pt became more drowsy, anterior spillage of liquids x1. SLP educated family that this was a sign pt becoming less alert and recommended holding further PO until he is fully alert. Will downgrade to dys 1, nectar thick liquids to facilitate ease of intake. Family in agreement. Will continue to follow.     HPI HPI: 61 y.o.malewith past medical history of stroke, COPD, non-small cell lung cancer diagnosed in July 2018, home oxygen 2L, ETOH, and past smokeradmitted on 12/14/2018with shortness of breath.Followed by Dr. 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 Patient was considering starting palliative chemotherapy. This hospitalization, found to have post-obstructive pneumonia and receiving IV antibiotics. Required intubation 12/15 for worsening respiratory distress. Extubated 12/18. Patient decompensated 12/19 AM requiring BiPAP. Patient and family determined goals of DNR/DNI and comfort care. Palliative following.       SLP Plan  Continue with current plan of care       Recommendations   Diet recommendations: Dysphagia 1 (puree);Nectar-thick liquid Liquids provided via: Cup Medication Administration: Crushed with puree Supervision: Staff to assist with self feeding Compensations: Slow rate;Small sips/bites;Minimize environmental distractions                Oral Care Recommendations: Oral care BID Follow up Recommendations: Other (comment)(tbd) SLP Visit Diagnosis: Dysphagia, oral phase (R13.11) Plan: Continue with current plan of care       [place], [place], [place] Speech-Language Pathologist [phone]  Aliene Altes 12/06/2017, 12:06 PM

## 2017-12-06 NOTE — Progress Notes (Signed)
Daily Progress Note   Patient Name: Levi Brown       Date: 12/06/2017 DOB: 1956/10/12  Age: 61 y.o. MRN#: [id number] Attending Physician: Dickie La, MD Primary Care Physician: [name], FNP Admit Date: 12/08/2017  Reason for Consultation/Follow-up: Establishing goals of care, Non pain symptom management and Terminal Care  Subjective: Patient sleeping and appears comfortable during my visit. Per friend at bedside, episode of coughing/anxiety. He was given prn ativan and placed back on BiPAP. Large BM today.   GOC:  Good friend, Inez Catalina at bedside who I spoke with yesterday. She is very worried about Levi Brown and feels he is suffering. She again speaks of "denial" from the sisters and the importance of "doing what is best for him." Inez Catalina tells me Levi Brown continues to be resistant with morphine for SOB. Inez Catalina speaks of two other sisters that are rarely involved, but seem more understanding of poor prognosis. I told her I would try and facilitate a meeting with all sisters and FMTS.   Length of Stay: 10  Current Medications: Scheduled Meds:  . amiodarone  200 mg Oral Daily  . chlorhexidine  15 mL Mouth Rinse BID  . feeding supplement (PRO-STAT SUGAR FREE 64)  30 mL Oral BID  . glycopyrrolate  0.2 mg Intravenous TID  . mouth rinse  15 mL Mouth Rinse q12n4p  . morphine CONCENTRATE  5 mg Sublingual Q8H  . pantoprazole  40 mg Oral QHS  . senna-docusate  2 tablet Oral BID  . sodium chloride flush  10-40 mL Intracatheter Q12H    Continuous Infusions: . sodium chloride      PRN Meds: Place/Maintain arterial line **AND** sodium chloride, acetaminophen (TYLENOL) oral liquid 160 mg/5 mL, albuterol, atropine, bisacodyl, hydrALAZINE, Influenza vac split quadrivalent PF,  LORazepam **OR** LORazepam, morphine injection, morphine CONCENTRATE, RESOURCE THICKENUP CLEAR, sodium chloride flush, traZODone  Physical Exam  Constitutional: He is easily aroused. He appears ill.  HENT:  Head: Normocephalic and atraumatic.  Cardiovascular: Regular rhythm.  Pulmonary/Chest: No accessory muscle usage. No tachypnea. No respiratory distress.  [place]  Abdominal: He exhibits distension. There is no tenderness.  Neurological: He is alert and easily aroused.  Drowsy, periods of confusion  Skin: Skin is warm and dry.  Psychiatric: His speech is delayed. He is inattentive.  Nursing note and  vitals reviewed.          Vital Signs: BP 126/79 (BP Location: Right Arm)   Pulse (!) 110   Temp 97.7 F (36.5 C) (Axillary)   Resp 15   Ht '5\' 6"'$  (1.676 m)   Wt 72.5 kg (159 lb 13.3 oz)   SpO2 95%   BMI 25.80 kg/m  SpO2: SpO2: 95 % O2 Device: O2 Device: Bi-PAP O2 Flow Rate: O2 Flow Rate (L/min): 4 L/min  Intake/output summary:   Intake/Output Summary (Last 24 hours) at 12/06/2017 1439 Last data filed at 12/06/2017 1421 Gross per 24 hour  Intake 702 ml  Output 1100 ml  Net -398 ml   LBM: Last BM Date: 12/05/17 Baseline Weight: Weight: 72.6 kg (160 lb 0.9 oz) Most recent weight: Weight: 72.5 kg (159 lb 13.3 oz)       Palliative Assessment/Data: PPS 40%   Flowsheet Rows     Most Recent Value  Intake Tab  Referral Department  Critical care  Unit at Time of Referral  ICU  Palliative Care Primary Diagnosis  Cancer  Date Notified  11/30/17  Palliative Care Type  New Palliative care  Reason for referral  Clarify Goals of Care  Date of Admission  11/30/2017  Date first seen by Palliative Care  12/01/17  # of days Palliative referral response time  1 Day(s)  # of days IP prior to Palliative referral  4  Clinical Assessment  Palliative Performance Scale Score  40%  Psychosocial & Spiritual Assessment  Palliative Care Outcomes  Patient/Family meeting held?  Yes  Who was at  the meeting?  patient and sister Levi Brown)   Palliative Care Outcomes  Clarified goals of care, Provided end of life care assistance, Provided psychosocial or spiritual support, ACP counseling assistance, Improved pain interventions, Improved non-pain symptom therapy, Counseled regarding hospice      Patient Active Problem List   Diagnosis Date Noted  . Constipation   . Palliative care encounter   . Acute on chronic respiratory failure ([place])   . Pneumonia due to infectious organism   . Shortness of breath   . Acute respiratory failure with hypercapnia ([place])   . HCAP (healthcare-associated pneumonia)   . Atrial flutter ([place]) 11/25/2017  . Atrial fibrillation with RVR ([place])   . Arterial hypotension   . Alcohol abuse   . Palliative care by specialist   . Goals of care, counseling/discussion   . Edema 08/31/2017  . Hypokalemia 08/29/2017  . Acute respiratory failure with hypoxia and hypercapnia ([place]) 08/26/2017  . Pleural effusion on left 08/26/2017  . Collapse of left lung 08/26/2017  . Hyponatremia 08/26/2017  . COPD exacerbation ([place]) 08/26/2017  . Obstructive pneumonia 08/26/2017  . Non-small cell carcinoma of lung ([place])   . Non-small cell cancer of left lung ([place]) 07/13/2017  . Elevated blood pressure reading 07/05/2017    Palliative Care Assessment & Plan   Patient Profile:  61 y.o. male  with past medical history of stroke, COPD, non-small cell lung cancer diagnosed in July 2018, home oxygen 2L, ETOH, and past smoker admitted on 12/07/2017 with shortness of breath. Followed by Dr. 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 Patient was considering starting palliative chemotherapy. This hospitalization, found to have post-obstructive pneumonia and receiving IV antibiotics. Required intubation 12/15 for worsening respiratory distress. Extubated 12/18. Patient decompensated 12/19 AM requiring BiPAP. PCCM met with family for [place]. Temporary intubation versus comfort. Patient  and family determined goals of DNR/DNI and comfort care. Palliative  medicine consultation placed.   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   Encourage prn morphine for dyspnea over BIPAP use.   Sisters struggle with accepting poor prognosis and full comfort approach. Recommended hospice services on discharge. Sister not ready to make this decision yet.   Will try and facilitate a meeting with sisters, palliative, and FMTS.  Code Status: DNR/DNI   Code Status Orders  (From admission, onward)        Start     Ordered   12/01/17 1000  Do not attempt resuscitation (DNR)  Continuous    Question Answer Comment  Maintain current active treatments No   Do not initiate new interventions Yes      12/01/17 0959    Code Status History    Date Active Date Inactive Code Status Order ID Comments User Context   11/30/2017 09:51 12/01/2017 09:59 Partial Code [id number]  Rush Farmer, MD Inpatient   11/13/2017 17:53 11/30/2017 09:51 Full Code [id number]  Steve Rattler, DO Inpatient   08/26/2017 11:03 09/04/2017 17:12 DNR [id number]  Juanito Doom, MD Inpatient   08/26/2017 00:24 08/26/2017 11:03 Full Code [id number]  Aldean Jewett, MD ED   07/05/2017 22:13 07/08/2017 20:30 Full Code [id number]  Ivor Costa, MD ED       Prognosis:   Unable to determine: poor prognosis with acute on chronic respiratory failure secondary to NSCLC, obstructing mediastinal mass, and post obstructive pneumonia. High risk for respiratory decompensation.   Discharge Planning:  To Be Determined  Care plan was discussed with patient, friend Inez Catalina)  Thank you for allowing the Palliative Medicine Team to assist in the care of this patient.   Time  In: 6761 Time Out: 1520 Total Time 28mn Prolonged Time Billed no      Greater than 50%  of this time was spent counseling and coordinating care related to the above assessment and plan.  MIhor Dow FNP-C Palliative Medicine Team  Phone: [phone]: [phone] Please contact Palliative Medicine Team phone at [phone] questions and concerns.

## 2017-12-06 NOTE — Progress Notes (Signed)
Subjective: The patient is seen and examined today.  His girlfriend was at the bedside.  He is a very pleasant 61 years old African-American male with metastatic non-small cell lung cancer, squamous cell carcinoma status post a course of palliative radiotherapy to the large left upper lobe mass with mediastinal invasion.  The patient was considered for systemic chemotherapy for his lung cancer but he was reluctant to consider this treatment for several months.  Unfortunately his disease progressed to the point that he is not a good candidate for treatment anymore.  He was intubated recently for shortness of breath.  He is currently on CPAP.  He is poorly responsive.  The family requested a visit with me to discuss his prognosis and treatment options.  Objective: Vital signs in last 24 hours: Temp:  [97.6 F (36.4 C)-98.6 F (37 C)] 97.7 F (36.5 C) (12/24 1211) Pulse Rate:  [102-117] 110 (12/24 1300) Resp:  [15-16] 15 (12/24 0350) BP: (112-135)/(75-88) 126/79 (12/24 1211) SpO2:  [95 %-100 %] 95 % (12/24 1300) FiO2 (%):  [50 %] 50 % (12/24 1300) Weight:  [159 lb 13.3 oz (72.5 kg)] 159 lb 13.3 oz (72.5 kg) (12/24 0350)  Intake/Output from previous day: 12/23 0701 - 12/24 0700 In: 222 [P.O.:222] Out: 700 [Urine:700] Intake/Output this shift: Total I/O In: 480 [P.O.:480] Out: 400 [Urine:400]  General appearance: alert, cooperative, fatigued and moderate distress Resp: diminished breath sounds LLL and LUL and dullness to percussion LLL and LUL Cardio: regular rate and rhythm, S1, S2 normal, no murmur, click, rub or gallop GI: soft, non-tender; bowel sounds normal; no masses,  no organomegaly Extremities: extremities normal, atraumatic, no cyanosis or edema  Lab Results:  No results for input(s): WBC, HGB, HCT, PLT in the last 72 hours. BMET No results for input(s): NA, K, CL, CO2, GLUCOSE, BUN, CREATININE, CALCIUM in the last 72 hours.  Studies/Results: Dg Swallowing Func-speech  Pathology  Result Date: 12/05/2017 Objective Swallowing Evaluation: Type of Study: MBS-Modified Barium Swallow Study  Patient Details Name: Levi Brown MRN: [id number] Date of Birth: [birth year] Today's Date: 12/05/2017 Time: SLP Start Time (ACUTE ONLY): 1000 -SLP Stop Time (ACUTE ONLY): 1025 SLP Time Calculation (min) (ACUTE ONLY): 25 min Past Medical History: Past Medical History: Diagnosis Date . Former smoker   Quit smoking in early 2017 . Non-small cell cancer of left lung ([place]) 07/13/2017 . Stroke [facility]) 07/07/2017  [place] - pt unaware, but imaging suggests old strokes Past Surgical History: Past Surgical History: Procedure Laterality Date . VIDEO BRONCHOSCOPY Bilateral 07/07/2017  Procedure: VIDEO BRONCHOSCOPY WITHOUT FLUORO;  Surgeon: Collene Gobble, MD;  Location: [facility] ENDOSCOPY;  Service: Cardiopulmonary;  Laterality: Bilateral; HPI: 61 y.o.malewith past medical history of stroke, COPD, non-small cell lung cancer diagnosed in July 2018, home oxygen 2L, ETOH, and past smokeradmitted on 12/14/2018with shortness of breath.Followed by Dr. 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 Patient was considering starting palliative chemotherapy. This hospitalization, found to have post-obstructive pneumonia and receiving IV antibiotics. Required intubation 12/15 for worsening respiratory distress. Extubated 12/18. Patient decompensated 12/19 AM requiring BiPAP. Patient and family determined goals of DNR/DNI and comfort care. Palliative following.  Subjective: "can i have some water?" Assessment / Plan / Recommendation CHL IP CLINICAL IMPRESSIONS 12/05/2017 Clinical Impression Patient presents with moderate oral and mild pharyngeal dysphagia due to cognitive/sensory deficits. Oral stage characterized by bolus holding, prolonged bolus formation, and significantly delayed anterior to posterior transit. Premature spillage of liquids to pyriform sinuses before the swallow. This resulted in 2  instances of  aspiration with thin liquids which spilled into the airway prior to laryngeal closure and were aspirated during the swallow. Pt sensed aspiration events, however cough is weak and was not protective. Swallow initiation timely at the base of tongue/valleculae for solids. Trace, shallow penetration of larger sip of nectar thick liquid x1. With solids, pt required several minutes for mastication and oral transit. Mild vallecular residue clears with wash of nectar thick liquid. Will initiate dys 2 with nectar thick liquids, meds crushed in applesauce. Supervision/assistance for slow rate, small bites and sips. If pt exhibiting difficulty with solids, may downgrade to dys 1 to reduce work of eating. Suspect pt's dysphagia is more likely chronic than related to intubation, however given pt's respiratory status, acute illness and weak cough post-extubation, short-term modification of liquids to nectar recommended. Will follow for tolerance at bedside and for advancement of liquids pending improvements in cough strength and respiratory status. SLP Visit Diagnosis Dysphagia, oral phase (R13.11) Attention and concentration deficit following -- Frontal lobe and executive function deficit following -- Impact on safety and function Mild aspiration risk   CHL IP TREATMENT RECOMMENDATION 12/05/2017 Treatment Recommendations Therapy as outlined in treatment plan below   Prognosis 12/05/2017 Prognosis for Safe Diet Advancement Good Barriers to Reach Goals Other (Comment);Cognitive deficits Barriers/Prognosis Comment -- CHL IP DIET RECOMMENDATION 12/05/2017 SLP Diet Recommendations Dysphagia 2 (Fine chop) solids;Nectar thick liquid Liquid Administration via Cup;Straw Medication Administration Crushed with puree Compensations Slow rate;Small sips/bites Postural Changes --   CHL IP OTHER RECOMMENDATIONS 12/05/2017 Recommended Consults -- Oral Care Recommendations Oral care BID Other Recommendations Have oral suction available   CHL IP  FOLLOW UP RECOMMENDATIONS 12/05/2017 Follow up Recommendations Other (comment)   CHL IP FREQUENCY AND DURATION 12/05/2017 Speech Therapy Frequency (ACUTE ONLY) min 2x/week Treatment Duration 2 weeks      CHL IP ORAL PHASE 12/05/2017 Oral Phase Impaired Oral - Pudding Teaspoon -- Oral - Pudding Cup -- Oral - Honey Teaspoon Reduced posterior propulsion;Holding of bolus;Lingual pumping;Premature spillage;Delayed oral transit Oral - Honey Cup -- Oral - Nectar Teaspoon Lingual pumping;Reduced posterior propulsion;Holding of bolus;Delayed oral transit;Premature spillage Oral - Nectar Cup Lingual pumping;Delayed oral transit;Premature spillage;Reduced posterior propulsion;Holding of bolus Oral - Nectar Straw Lingual pumping;Holding of bolus;Reduced posterior propulsion;Delayed oral transit;Premature spillage Oral - Thin Teaspoon Lingual pumping;Holding of bolus;Reduced posterior propulsion;Delayed oral transit;Premature spillage Oral - Thin Cup Lingual pumping;Reduced posterior propulsion;Holding of bolus;Delayed oral transit;Premature spillage Oral - Thin Straw Lingual pumping;Reduced posterior propulsion;Holding of bolus;Delayed oral transit;Premature spillage Oral - Puree Lingual pumping;Impaired mastication;Reduced posterior propulsion;Holding of bolus;Delayed oral transit Oral - Mech Soft -- Oral - Regular Impaired mastication;Lingual pumping;Holding of bolus;Reduced posterior propulsion;Delayed oral transit Oral - Multi-Consistency -- Oral - Pill Impaired mastication;Lingual pumping;Holding of bolus;Reduced posterior propulsion;Delayed oral transit Oral Phase - Comment --  CHL IP PHARYNGEAL PHASE 12/05/2017 Pharyngeal Phase Impaired Pharyngeal- Pudding Teaspoon -- Pharyngeal -- Pharyngeal- Pudding Cup -- Pharyngeal -- Pharyngeal- Honey Teaspoon -- Pharyngeal -- Pharyngeal- Honey Cup -- Pharyngeal -- Pharyngeal- Nectar Teaspoon -- Pharyngeal -- Pharyngeal- Nectar Cup Delayed swallow initiation-pyriform  sinuses;Penetration/Aspiration during swallow Pharyngeal Material enters airway, remains ABOVE vocal cords and not ejected out Pharyngeal- Nectar Straw -- Pharyngeal -- Pharyngeal- Thin Teaspoon Delayed swallow initiation-pyriform sinuses;Penetration/Aspiration before swallow;Trace aspiration;Penetration/Aspiration during swallow Pharyngeal Material enters airway, passes BELOW cords and not ejected out despite cough attempt by patient Pharyngeal- Thin Cup Delayed swallow initiation-pyriform sinuses Pharyngeal -- Pharyngeal- Thin Straw Delayed swallow initiation-pyriform sinuses;Penetration/Aspiration before swallow;Penetration/Aspiration during swallow;Moderate aspiration Pharyngeal Material enters airway,  passes BELOW cords and not ejected out despite cough attempt by patient Pharyngeal- Puree -- Pharyngeal -- Pharyngeal- Mechanical Soft -- Pharyngeal -- Pharyngeal- Regular -- Pharyngeal -- Pharyngeal- Multi-consistency -- Pharyngeal -- Pharyngeal- Pill -- Pharyngeal -- Pharyngeal Comment --  CHL IP CERVICAL ESOPHAGEAL PHASE 12/05/2017 Cervical Esophageal Phase WFL Pudding Teaspoon -- Pudding Cup -- Honey Teaspoon -- Honey Cup -- Nectar Teaspoon -- Nectar Cup -- Nectar Straw -- Thin Teaspoon -- Thin Cup -- Thin Straw -- Puree -- Mechanical Soft -- Regular -- Multi-consistency -- Pill -- Cervical Esophageal Comment -- Deneise Lever, MS, CCC-SLP Speech-Language Pathologist [phone] No flowsheet data found. Aliene Altes 12/05/2017, 1:43 PM               Medications: I have reviewed the patient's current medications.  CODE STATUS: No CODE BLUE  Assessment/Plan: This is a very pleasant 61 years old African-American male with metastatic non-small cell lung cancer, squamous cell carcinoma status post palliative radiotherapy to the left lung mass and mediastinum.  It was recommended for the patient in the past to consider palliative systemic chemotherapy but he declined several times and did not show up for his  treatment. Unfortunately his disease progressed to the point that further treatment would not be a good option at this point.  His condition is terminal and the patient is likely to die during this admission. I had a lengthy discussion with the girlfriend who was available at the bedside about his condition.  I strongly recommended for them to continue on palliative care and hospice. She agreed with the current plan.  The patient was not responsive enough to make a decision. Thank you so much for taking good care of Mr. Morey Hummingbird.  Please call if you have any questions.   LOS: 10 days    Eilleen Kempf 12/06/2017

## 2017-12-07 DIAGNOSIS — Z515 Encounter for palliative care: Secondary | ICD-10-CM

## 2017-12-07 DIAGNOSIS — Z9911 Dependence on respirator [ventilator] status: Secondary | ICD-10-CM

## 2017-12-07 NOTE — Progress Notes (Signed)
RT come in the room to check to do a BIPAP check.  Pt became agitated and pulled the mask off his face.  RT placed pt back on 4L/[place], SPO2-100% RR-16 and HR-110.  Pt did not seem in any discomfort and not increased work of breathing noted.  RT will continue to monitor.

## 2017-12-07 NOTE — Progress Notes (Signed)
Daily Progress Note   Patient Name: Levi Brown       Date: 12/07/2017 DOB: Mar 06, 1956  Age: 61 y.o. MRN#: [id number] Attending Physician: Dickie La, MD Primary Care Physician: [name], FNP Admit Date: 12/04/2017  Reason for Consultation/Follow-up: Establishing goals of care, Non pain symptom management and Terminal Care  Subjective: Patient with clinical decline today. Lethargic with agonal breathing. Appears comfortable on 4L.   No family at bedside.   Length of Stay: 11  Current Medications: Scheduled Meds:  . amiodarone  200 mg Oral Daily  . chlorhexidine  15 mL Mouth Rinse BID  . feeding supplement (PRO-STAT SUGAR FREE 64)  30 mL Oral BID  . glycopyrrolate  0.2 mg Intravenous TID  . mouth rinse  15 mL Mouth Rinse q12n4p  . morphine CONCENTRATE  5 mg Sublingual Q8H  . pantoprazole  40 mg Oral QHS  . senna-docusate  2 tablet Oral BID  . sodium chloride flush  10-40 mL Intracatheter Q12H    Continuous Infusions: . sodium chloride      PRN Meds: Place/Maintain arterial line **AND** sodium chloride, acetaminophen (TYLENOL) oral liquid 160 mg/5 mL, albuterol, atropine, bisacodyl, hydrALAZINE, Influenza vac split quadrivalent PF, LORazepam **OR** LORazepam, morphine injection, morphine CONCENTRATE, RESOURCE THICKENUP CLEAR, sodium chloride flush, traZODone  Physical Exam  Constitutional: He appears lethargic. He appears ill.  HENT:  Head: Normocephalic and atraumatic.  Cardiovascular: Regular rhythm.  Pulmonary/Chest: No accessory muscle usage. No tachypnea. No respiratory distress.  Agonal  Abdominal: He exhibits distension. There is no tenderness.  Neurological: He appears lethargic.  Skin: Skin is warm and dry.  Psychiatric: He is inattentive.    Nursing note and vitals reviewed.          Vital Signs: BP 118/74   Pulse (!) 108   Temp 98.5 F (36.9 C) (Oral)   Resp 16   Ht '5\' 6"'$  (1.676 m)   Wt 70.5 kg (155 lb 6.8 oz)   SpO2 98%   BMI 25.09 kg/m  SpO2: SpO2: 98 % O2 Device: O2 Device: Nasal Cannula O2 Flow Rate: O2 Flow Rate (L/min): 4 L/min  Intake/output summary:   Intake/Output Summary (Last 24 hours) at 12/07/2017 1606 Last data filed at 12/07/2017 1350 Gross per 24 hour  Intake 120 ml  Output 800 ml  Net -680 ml   LBM: Last BM Date: 12/05/17 Baseline Weight: Weight: 72.6 kg (160 lb 0.9 oz) Most recent weight: Weight: 70.5 kg (155 lb 6.8 oz)       Palliative Assessment/Data: PPS 20%    Patient Active Problem List   Diagnosis Date Noted  . Anxiety state   . Constipation   . Palliative care encounter   . Acute on chronic respiratory failure ([place])   . Pneumonia due to infectious organism   . Shortness of breath   . Acute respiratory failure with hypercapnia ([place])   . HCAP (healthcare-associated pneumonia)   . Atrial flutter ([place]) 12/06/2017  . Atrial fibrillation with RVR ([place])   . Arterial hypotension   . Alcohol abuse   . Palliative care by specialist   . Goals of care, counseling/discussion   . Edema 08/31/2017  . Hypokalemia 08/29/2017  . Acute respiratory failure with hypoxia and hypercapnia ([place]) 08/26/2017  . Pleural effusion on left 08/26/2017  . Collapse of left lung 08/26/2017  . Hyponatremia 08/26/2017  . COPD exacerbation ([place]) 08/26/2017  . Obstructive pneumonia 08/26/2017  . Non-small cell carcinoma of lung ([place])   . Non-small cell cancer of left lung ([place]) 07/13/2017  . Elevated blood pressure reading 07/05/2017    Palliative Care Assessment & Plan   Patient Profile:  61 y.o. male  with past medical history of stroke, COPD, non-small cell lung cancer diagnosed in July 2018, home oxygen 2L, ETOH, and past smoker admitted on 12/09/2017 with shortness of breath. Followed by Dr.  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 Patient was considering starting palliative chemotherapy. This hospitalization, found to have post-obstructive pneumonia and receiving IV antibiotics. Required intubation 12/15 for worsening respiratory distress. Extubated 12/18. Patient decompensated 12/19 AM requiring BiPAP. PCCM met with family for [place]. Temporary intubation versus comfort. Patient and family determined goals of DNR/DNI and comfort care. Palliative medicine consultation placed.   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   Encourage prn morphine for dyspnea over BIPAP use.   Sisters struggle with accepting poor prognosis and full comfort approach.   Patient appears to be actively dying. No family at bedside. Discussed with attending who plans to update family.   Code Status: DNR/DNI   Code Status Orders  (From admission, onward)        Start     Ordered   12/01/17 1000  Do not attempt resuscitation (DNR)  Continuous    Question Answer Comment  Maintain current active treatments No   Do not initiate new interventions Yes      12/01/17 0959    Code Status History    Date Active Date Inactive Code Status Order ID Comments User Context   11/30/2017 09:51 12/01/2017 09:59 Partial Code [id number]  Rush Farmer, MD Inpatient   12/01/2017 17:53 11/30/2017 09:51 Full Code [id number]  Steve Rattler, DO Inpatient   08/26/2017 11:03 09/04/2017 17:12 DNR [id number]  Juanito Doom, MD Inpatient   08/26/2017 00:24 08/26/2017 11:03 Full Code [id number]  Aldean Jewett, MD ED   07/05/2017 22:13 07/08/2017 20:30 Full Code [id number]  Ivor Costa, MD ED       Prognosis:   Hours - Days: poor prognosis with acute on  chronic respiratory failure secondary to NSCLC, obstructing mediastinal mass,  and post obstructive pneumonia. High risk for respiratory decompensation.   Discharge Planning:  To Be Determined  Care plan was discussed with patient and attending   Thank you for allowing the Palliative Medicine Team to assist in the care of this patient.   Time In: 1435 Time Out: 1450 Total Time 50mn Prolonged Time Billed no      Greater than 50%  of this time was spent counseling and coordinating care related to the above assessment and plan.  MIhor Dow FNP-C Palliative Medicine Team  Phone: [phone]: [phone] Please contact Palliative Medicine Team phone at [phone] questions and concerns.

## 2017-12-07 NOTE — Progress Notes (Signed)
  Patient was nasotracheally suction down the left nare with copious white;clear return.    12/07/17 2035  Respiratory Assessment  Assessment Type Assess only  Respiratory Pattern Regular;Unlabored  Chest Assessment Chest expansion symmetrical  Cough Non-productive;Congested  Sputum Amount Copious  Sputum Color Clear;White;Pink tinged  Sputum Consistency Thick  Bilateral Breath Sounds Rhonchi;Diminished  Oxygen Therapy/Pulse Ox  O2 Device Nasal Cannula  O2 Flow Rate (L/min) 4 L/min

## 2017-12-07 NOTE — Progress Notes (Signed)
Family Medicine Teaching Service Daily Progress Note Intern Pager: [phone]  Patient name: Levi Brown Medical record number: [id number] Date of birth: 1956-08-10 Age: 61 y.o. Gender: male  Primary Care Provider: [name], FNP Consultants: CCM (signed off) Code Status: Full  Pt Overview and Major Events to Date:  Levi Brown is a 61 y.o. male presenting with shortness of breath . PMH is significant for non-small cell lung cancer diagnosed in July 2018, HTN, hx tobacco abuse, hx alcohol abuse, COPD  Assessment and Plan:  Dyspnea 2/2 terminal NSCLC, worsening. Acutely worsening respiratory status this admit with agonal breathing. Requiring morphine for dyspnea and air hunger and prn BiPAP. Sister has been resistant to moving to full comfort care. Followed by oncology Dr. Inda Merlin, had the maximum radiation treatment and not candidate for chemotherapy. -Palliative following, encourage prn morphine over BiPAP. -Oncology to see patient today. -Continue Sublingual morphine prn -Continue ativan as needed for anxiety -Bipap prn for support/comfort (DNR)  -Morphine and ativan prn for comfort measures -Continue robinul for secretion management  Insomnia. Patient previously on Ambien which caused worsening confusion and AMS.  -Avoid ambien -Continue trazadone 25 mg qhs prn per palliative  Agitation/AMS Likely secondary to worsening respiratory status. -Continue ativan as needed. -Bipap prn  Atrial Flutter: Currently still intermittently tachycardic HR to 110s - Continue amiodarone 200mg  oral  COPD, ?HCAP: Chronic. 30 pack year history, quit 4 years ago. Requiring 8L O2 currently from baseline 2L. Completed treatment for a possible post obstructive HCAP. -albuterol nebs PRN -duonebs q4 PRN -cont to monitor pulse ox -s/p Zosyn (12/16-12/21), Augmentin 875-125mg  BID (12/21-12/23), has more than completed 7 day course.  Severe Protein Calorie Malnutrition: Patient has  [place] and has been requiring boost supplementation.  -cont boost supplements  Constipation, resolved with enema -senna-docusate BID -dulcolax prn  FEN/GI: Dysphagia 2 diet PPx: none, transitioning to comfort  Disposition: hospice vs home care  Subjective:  No acute events overnight.  Completely altered. Agonal breathing.  Objective: Temp:  [97.6 F (36.4 C)-98.6 F (37 C)] 98.5 F (36.9 C) (12/25 1226) Pulse Rate:  [81-115] 108 (12/25 1226) Resp:  [15-20] 16 (12/25 0400) BP: (102-131)/(74-91) 118/74 (12/25 1226) SpO2:  [93 %-100 %] 98 % (12/25 1226) Weight:  [155 lb 6.8 oz (70.5 kg)] 155 lb 6.8 oz (70.5 kg) (12/25 0530) Physical Exam:  General: Ill-appearing male, laying in bed with eyes closed Cardiovascular: Tachycardic Respiratory: agonal breathing on  4L via nasal cannula, no lung sounds on the right, decreased on the left Abdomen: soft, nondistended, non-tender, +bs Extremities: no LE edema  Laboratory: Recent Labs  Lab 12/01/17 0312 12/02/17 0350  WBC 21.6* 15.7*  HGB 9.2* 8.3*  HCT 30.8* 28.5*  PLT 322 300   Recent Labs  Lab 12/01/17 0312 12/02/17 0350  NA 132* 136  K 4.7 3.7  CL 90* 87*  CO2 36* 42*  BUN 11 11  CREATININE 0.63 0.82  CALCIUM 9.0 9.1  GLUCOSE 121* 93    Imaging/Diagnostic Tests: 12/15 - CT Angio: IMPRESSION: Large left upper lobe mass again noted, invading the mediastinum, unchanged since prior study.  There is enlarging large left effusion and new small right effusion. Only a small amount of aerated left upper lobe now present. Airspace disease within the lingula and left lower lobe could reflect atelectasis or pneumonia.  Cardiomegaly, small pericardial effusion.  Severe attenuation/narrowing of the left upper lobe pulmonary artery by the left upper lobe mass.  No evidence of pulmonary embolus.  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 Recommend attention on follow-up  imaging.  12/20 - CXR (portable 1-view): IMPRESSION: 1. Improved pulmonary edema. 2. Left mediastinal mass and volume loss.  Tonette Bihari, MD 12/07/2017, 2:38 PM PGY-3, [place] Intern pager: [phone], text pages welcome

## 2017-12-08 ENCOUNTER — Ambulatory Visit: Payer: Medicaid Other | Admitting: Family Medicine

## 2017-12-08 MED ORDER — LORAZEPAM 2 MG/ML IJ SOLN
1.0000 mg | INTRAMUSCULAR | Status: DC | PRN
  Administered 2017-12-08 – 2017-12-09 (×7): 1 mg via INTRAVENOUS
  Filled 2017-12-08 (×8): qty 1

## 2017-12-08 MED ORDER — MORPHINE SULFATE (PF) 2 MG/ML IV SOLN
1.0000 mg | INTRAVENOUS | Status: DC
  Administered 2017-12-08 – 2017-12-09 (×7): 1 mg via INTRAVENOUS
  Filled 2017-12-08 (×7): qty 1

## 2017-12-08 NOTE — Plan of Care (Signed)
  Clinical Measurements: Ability to maintain clinical measurements within normal limits will improve. Pt has poor po intake. Pt rhythm is sinus tach, current O2 at 95% on bipap all other vs stable at this time. Pt is agitated and restless at times, trying to pull out tubing and climb out of bed. Prn medications given per orders. Telesitter in room per family request.  12/08/2017 1610 - Not Progressing by Mellissa Kohut, RN   Skin Integrity: Risk for impaired skin integrity will decrease. Pt repositioned q2h, sacral pad changed, heel protectors on, heels floated, bony prominences supported with pillows. Will continue to monitor and assess pt this shift.  12/08/2017 0342 - Progressing by Mellissa Kohut, RN

## 2017-12-08 NOTE — Progress Notes (Signed)
Family Medicine Teaching Service Daily Progress Note Intern Pager: [phone]  Patient name: EBER FERRUFINO Medical record number: [id number] Date of birth: Jun 25, 1956 Age: 61 y.o. Gender: male  Primary Care Provider: [name], FNP Consultants: CCM (signed off) Code Status: Full  Pt Overview and Major Events to Date:  CATO LIBURD is a 61 y.o. male presenting with shortness of breath . PMH is significant for non-small cell lung cancer diagnosed in July 2018, HTN, hx tobacco abuse, hx alcohol abuse, COPD  Assessment and Plan:  Dyspnea 2/2 terminal NSCLC, worsening. Acutely worsening respiratory status this admit with agonal breathing. Requiring morphine for dyspnea and air hunger and prn BiPAP. Sister has been resistant to moving to full comfort care. Followed by oncology Dr. Inda Merlin, had the maximum radiation treatment and not candidate for chemotherapy. -Palliative following, encourage prn morphine over BiPAP. -Continue Sublingual morphine on schedule -Continue ativan as needed for anxiety -[place] as respiratory support instead of bipap since he seems aggitated by bipap -Continue robinul for secretion management  Insomnia. Patient previously on Ambien which caused worsening confusion and AMS.  -Avoid ambien -Continue trazadone 25 mg qhs prn per palliative  Agitation/AMS Likely secondary to worsening respiratory status. -Continue ativan as needed. -bipap seems to cause aggitation so using [place] for respiratory support  Atrial Flutter: Currently still intermittently tachycardic HR to 110-120s - Continue amiodarone 200mg  oral  COPD, ?HCAP: Chronic. 30 pack year history, quit 4 years ago. Requiring 8L O2 currently from baseline 2L. Completed treatment for a possible post obstructive HCAP. -albuterol nebs PRN -duonebs q4 PRN -cont to monitor pulse ox -s/p Zosyn (12/16-12/21), Augmentin 875-125mg  BID (12/21-12/23), has more than completed 7 day course.  Severe Protein Calorie  Malnutrition: Patient has [place] and has been requiring boost supplementation.  -cont boost supplements  Constipation, resolved with enema -senna-docusate BID -dulcolax prn  FEN/GI: Dysphagia 2 diet PPx: none, transitioning to comfort  Disposition: hospice vs home care  Subjective:  Patient was non-verbal during rounds but was able to make eye contact.  His girlfriend betty was bedside and he seemed comforted by her.   We discussed that there was pain medication available and she could tell the nurse if she thought he looked uncomfortable.  Objective: Temp:  [97.6 F (36.4 C)-98.5 F (36.9 C)] 97.6 F (36.4 C) (12/26 0446) Pulse Rate:  [106-119] 106 (12/26 0649) Resp:  [17-18] 18 (12/25 2351) BP: (115-134)/(74-107) 134/92 (12/26 0446) SpO2:  [88 %-100 %] 98 % (12/26 0649) FiO2 (%):  [40 %] 40 % (12/25 2351) Weight:  [149 lb 11.1 oz (67.9 kg)] 149 lb 11.1 oz (67.9 kg) (12/26 0446) Physical Exam:  General: Ill-appearing male, laying in bed with eyes closed Cardiovascular: Tachycardic Respiratory: agonal breathing on  4L via nasal cannula, no lung sounds on the right, decreased on the left Abdomen: soft, nondistended, non-tender, +bs Extremities: no LE edema  Laboratory: Recent Labs  Lab 12/02/17 0350  WBC 15.7*  HGB 8.3*  HCT 28.5*  PLT 300   Recent Labs  Lab 12/02/17 0350  NA 136  K 3.7  CL 87*  CO2 42*  BUN 11  CREATININE 0.82  CALCIUM 9.1  GLUCOSE 93    Imaging/Diagnostic Tests: 12/15 - CT Angio: IMPRESSION: Large left upper lobe mass again noted, invading the mediastinum, unchanged since prior study.  There is enlarging large left effusion and new small right effusion. Only a small amount of aerated left upper lobe now present. Airspace disease within the lingula and left  lower lobe could reflect atelectasis or pneumonia.  Cardiomegaly, small pericardial effusion.  Severe attenuation/narrowing of the left upper lobe pulmonary artery by the left  upper lobe mass.  No evidence of pulmonary embolus.  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 Recommend attention on follow-up imaging.  12/20 - CXR (portable 1-view): IMPRESSION: 1. Improved pulmonary edema. 2. Left mediastinal mass and volume loss.  Sherene Sires, DO 12/08/2017, 7:26 AM PGY-1, [place] Intern pager: [phone], text pages welcome

## 2017-12-08 NOTE — Progress Notes (Signed)
RT was called to room to NTS patient.  RT attempted to NTS patient twice and the patient starting swinging his arms and hitting at RT. Patient unable to maintain his O2 saturations on [place].  RT placed patient back on Bipap and informed RN of events.

## 2017-12-08 NOTE — Progress Notes (Addendum)
Daily Progress Note   Patient Name: Levi Brown       Date: 12/08/2017 DOB: 1956-07-02  Age: 61 y.o. MRN#: [id number] Attending Physician: Dickie La, MD Primary Care Physician: [name], FNP Admit Date: 11/16/2017  Reason for Consultation/Follow-up: Establishing goals of care, Non pain symptom management and Terminal Care  Subjective:  1791: Upon arrival to room, patient agitated and pulling at BiPAP. Mitts on and with tele sitter. Changed ativan to q2h prn. Sister, Levi Brown coming to bedside.   1130: Patient remains uncomfortable on BiPAP. RN at bedside giving prn Ativan.   GOC:  Levi Brown and sister, Levi Brown at bedside. Levi Brown is very tearful and withdrawn. Explained that [name] remains uncomfortable despite frequent prn medications. Continues to pull at mask with with mitts. Educated on focusing on interventions that provide him comfort and relief from suffering, including removing BiPAP and mitts and giving more medications if needed.   I offered to talk with her and all sisters together this afternoon. Levi Brown states "we all know what is happening" and declines a meeting. Levi Brown has struggled with accepting her brother's poor prognosis throughout his hospitalization.    Levi Brown gives me permission to remove the mask and mitts. "I don't want the mask anymore." She remains very tearful.   Educated on s/s of discomfort and encouraged Levi Brown to ask RN for further medication if needed. Levi Brown is comfortable when I left the room. On 6L [place] and mitts off. Oral care performed. Encouraged swabs for comfort.   Emotional and spiritual support provided.    Length of Stay: 12  Current Medications: Scheduled Meds:  . chlorhexidine  15 mL Mouth Rinse BID  .  glycopyrrolate  0.2 mg Intravenous TID  . mouth rinse  15 mL Mouth Rinse q12n4p  .  morphine injection  1 mg Intravenous Q4H  . senna-docusate  2 tablet Oral BID  . sodium chloride flush  10-40 mL Intracatheter Q12H    Continuous Infusions: . sodium chloride      PRN Meds: Place/Maintain arterial line **AND** sodium chloride, acetaminophen (TYLENOL) oral liquid 160 mg/5 mL, albuterol, atropine, bisacodyl, [DISCONTINUED] LORazepam **OR** LORazepam, morphine injection, morphine CONCENTRATE, RESOURCE THICKENUP CLEAR, sodium chloride flush  Physical Exam  Constitutional: He appears lethargic. He appears ill.  HENT:  Head: Normocephalic and atraumatic.  Cardiovascular: Regular rhythm.  Pulmonary/Chest: No accessory muscle usage. No tachypnea. No respiratory distress.  Agonal  Abdominal: He exhibits distension. There is no tenderness.  Neurological: He appears lethargic. He is disoriented.  Skin: Skin is warm and dry.  Psychiatric: He is agitated. Cognition and memory are impaired. He is noncommunicative.  Nursing note and vitals reviewed.          Vital Signs: BP 129/82 (BP Location: Right Arm)   Pulse (!) 106   Temp 98.1 F (36.7 C) (Axillary)   Resp 18   Ht '5\' 6"'$  (1.676 m)   Wt 67.9 kg (149 lb 11.1 oz)   SpO2 98%   BMI 24.16 kg/m  SpO2: SpO2: 98 % O2 Device: O2 Device: High Flow Nasal Cannula O2 Flow Rate: O2 Flow Rate (L/min): 5 L/min  Intake/output summary:   Intake/Output Summary (Last 24 hours) at 12/08/2017 1321 Last data filed at 12/08/2017 0400 Gross per 24 hour  Intake 0 ml  Output 875 ml  Net -875 ml   LBM: Last BM Date: 12/07/17 Baseline Weight: Weight: 72.6 kg (160 lb 0.9 oz) Most recent weight: Weight: 67.9 kg (149 lb 11.1 oz)       Palliative Assessment/Data: PPS 20%    Patient Active Problem List   Diagnosis Date Noted  . Terminal care   . Anxiety state   . Constipation   . Palliative care encounter   . Acute on chronic respiratory failure  ([place])   . Pneumonia due to infectious organism   . Shortness of breath   . Acute respiratory failure with hypercapnia ([place])   . HCAP (healthcare-associated pneumonia)   . Atrial flutter ([place]) 12/12/2017  . Atrial fibrillation with RVR ([place])   . Arterial hypotension   . Alcohol abuse   . Palliative care by specialist   . Goals of care, counseling/discussion   . Edema 08/31/2017  . Hypokalemia 08/29/2017  . Acute respiratory failure with hypoxia and hypercapnia ([place]) 08/26/2017  . Pleural effusion on left 08/26/2017  . Collapse of left lung 08/26/2017  . Hyponatremia 08/26/2017  . COPD exacerbation ([place]) 08/26/2017  . Obstructive pneumonia 08/26/2017  . Non-small cell carcinoma of lung ([place])   . Non-small cell cancer of left lung ([place]) 07/13/2017  . Elevated blood pressure reading 07/05/2017    Palliative Care Assessment & Plan   Patient Profile:  61 y.o. male  with past medical history of stroke, COPD, non-small cell lung cancer diagnosed in July 2018, home oxygen 2L, ETOH, and past smoker admitted on 11/25/2017 with shortness of breath. Followed by Dr. 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 Patient was considering starting palliative chemotherapy. This hospitalization, found to have post-obstructive pneumonia and receiving IV antibiotics. Required intubation 12/15 for worsening respiratory distress. Extubated 12/18. Patient decompensated 12/19 AM requiring BiPAP. PCCM met with family for [place]. Temporary intubation versus comfort. Patient and family determined goals of DNR/DNI and comfort care. Palliative medicine consultation placed.   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 After discussion with sister, Levi Brown, transition to comfort focused care and discontinuation of BiPAP  (which has been more agitating to patient).   Symptom management  Morphine '1mg'$  q4h IV scheduled   Morphine 2-'4mg'$  IV q1h prn pain/dyspnea/air hunger  Ativan '1mg'$  IV q2h prn anxiety/agitation  Continue scheduled robinul   Discontinued labs/interventions/medications not aimed at comfort.  Frequent oral care.   Comfort feeds per patient/family request.   Anticipate hospital death. Hours-days.   Code Status: DNR/DNI   Code Status Orders  (From admission, onward)        Start     Ordered   12/01/17 1000  Do not attempt resuscitation (DNR)  Continuous    Question Answer Comment  Maintain current active treatments No   Do not initiate new interventions Yes      12/01/17 0959    Code Status History    Date Active Date Inactive Code Status Order ID Comments User Context   11/30/2017 09:51 12/01/2017 09:59 Partial Code [id number]  Rush Farmer, MD Inpatient   11/25/2017 17:53 11/30/2017 09:51 Full Code [id number]  Steve Rattler, DO Inpatient   08/26/2017 11:03 09/04/2017 17:12 DNR [id number]  Juanito Doom, MD Inpatient   08/26/2017 00:24 08/26/2017 11:03 Full Code [id number]  Aldean Jewett, MD ED   07/05/2017 22:13 07/08/2017 20:30 Full Code [id number]  Levi Costa, MD ED       Prognosis:   Hours - Days: poor prognosis with acute on chronic respiratory failure secondary to NSCLC, obstructing mediastinal mass, and post obstructive pneumonia. High risk for respiratory decompensation.   Discharge Planning:  To Be Determined Anticipate hospital death.   Care plan was discussed with Inez Catalina and Levi Brown, Dr. Criss Rosales, RN  Thank you for allowing the Palliative Medicine Team to assist in the care of this patient.   Time In: 1030- 1130 Time Out: 1050 1200 Total Time 55mn Prolonged Time Billed no      Greater than 50%  of this time was spent counseling and coordinating care related to the above assessment and plan.  MIhor Dow FNP-C Palliative Medicine Team  Phone:  [phone]: [phone] Please contact Palliative Medicine Team phone at [phone] questions and concerns.

## 2017-12-09 MED ORDER — MORPHINE SULFATE (PF) 2 MG/ML IV SOLN
2.0000 mg | INTRAVENOUS | Status: DC
  Administered 2017-12-09 (×2): 2 mg via INTRAVENOUS
  Filled 2017-12-09 (×2): qty 1

## 2017-12-09 MED ORDER — LORAZEPAM 2 MG/ML IJ SOLN
2.0000 mg | INTRAMUSCULAR | Status: AC
  Administered 2017-12-09: 2 mg via INTRAVENOUS

## 2017-12-09 MED ORDER — LORAZEPAM 2 MG/ML IJ SOLN: 1.0000 mg | Freq: Four times a day (QID) | INTRAMUSCULAR | Status: DC

## 2017-12-09 MED ORDER — LORAZEPAM 2 MG/ML IJ SOLN
1.0000 mg | INTRAMUSCULAR | Status: DC
  Administered 2017-12-09 (×2): 1 mg via INTRAVENOUS
  Filled 2017-12-09 (×2): qty 1

## 2017-12-09 NOTE — Progress Notes (Signed)
Palliative Medicine RN Note: Symptom check. Girlfriend of 25 years is at the bedside. Reviewed notes from PMT. Girlfriend and charge RN report that sats have been dropping, so pt has been placed on and off bipap overnight despite family wishes that this not be continued. He remains on monitors but is comfort measures only.  Levi Brown is sleeping but arousable. Denies pain, SOB. Currently on [place]. He does not like the bipap and actively tries to remove it. Confirmed order to keep on  and remove bipap from room. Obtained orders for comfort measures, d/c monitors, VS daily.   Girlfriend verbalizes understanding that Levi Brown is dying. Goal is confirmed as comfort. Medications are available for pain, shortness of breath, secretions. PMT will follow up this afternoon to ensure symptoms remain controlled.  Levi Brown Levi Stockinger, RN, BSN, [facility] 12/09/2017 10:49 AM Office [phone]

## 2017-12-09 NOTE — Progress Notes (Signed)
[facility] Liaison:  RN visit  Per Threasa Beards, RN, PMT request, I went to talk with patient's sister, [name], to give more information on hospice services.  She asked about residential hospice and I explained services.  She declined wanting any services at this time and states "God is the ultimate healer and I know he's going to pull out of this".  I did give her contact information if she had any additional questions.  She advised that her sister would be here tomorrow and she will discuss with her and she will follow up with CSW here once they have decided what they'd like to do.   Please feel free to contact me with any other questions.   Edyth Gunnels, RN, BSN [facility] Liaison [phone]  All hospital liaisons are now on [place].

## 2017-12-09 NOTE — Progress Notes (Signed)
Daily Progress Note   Patient Name: Levi Brown       Date: 12/09/2017 DOB: 1955/12/24  Age: 61 y.o. MRN#: [id number] Attending Physician: Dickie La, MD Primary Care Physician: [name], FNP Admit Date: 12/06/2017  Reason for Consultation/Follow-up: Terminal Care  Subjective: Called to room for assistance with symptom management by palliative RN Melanie. Patient in bed, tachypneic, restless, pulling at gown and O2 tubing, audible secretions- '4mg'$  IV morphine did not provide relief. Family requesting further intervention. Reviewed chart and adjusted medications- observed administration of '2mg'$  IV lorazepam that provided patient relief.  Noted Hospice note stating patient's sister declining Hospice and expecting patient to recover. Discussed GOC with Celeste and Inez Catalina and confirmed GOC are comfort with expected outcome natural death without suffering. Celeste denies declining Hospice. Spoke with [place] and she states that [place] did decline Hospice placement this morning, but is "coming around now". Expressed to Inez Catalina my concern that patient may not survive through tonight.  ROS  Length of Stay: 13  Current Medications: Scheduled Meds:  . chlorhexidine  15 mL Mouth Rinse BID  . glycopyrrolate  0.2 mg Intravenous TID  . LORazepam  1 mg Intravenous Q4H  . mouth rinse  15 mL Mouth Rinse q12n4p  .  morphine injection  2 mg Intravenous Q4H  . sodium chloride flush  10-40 mL Intracatheter Q12H    Continuous Infusions: . sodium chloride      PRN Meds: Place/Maintain arterial line **AND** sodium chloride, acetaminophen (TYLENOL) oral liquid 160 mg/5 mL, albuterol, atropine, bisacodyl, [DISCONTINUED] LORazepam **OR** LORazepam, morphine injection, morphine CONCENTRATE,  sodium chloride flush  Physical Exam  Constitutional: He appears well-developed. He appears distressed.  Pulmonary/Chest:  tachypnea   Neurological:   Unresponsive to voice  Nursing note and vitals reviewed.           Vital Signs: BP 100/73 (BP Location: Right Arm)   Pulse (!) 117   Temp 98 F (36.7 C) (Axillary)   Resp 18   Ht '5\' 6"'$  (1.676 m)   Wt 67.9 kg (149 lb 11.1 oz)   SpO2 94%   BMI 24.16 kg/m  SpO2: SpO2: 94 % O2 Device: O2 Device: Nasal Cannula O2 Flow Rate: O2 Flow Rate (L/min): 5 L/min  Intake/output summary:   Intake/Output Summary (Last 24 hours) at  12/09/2017 1617 Last data filed at 12/08/2017 2300 Gross per 24 hour  Intake -  Output 600 ml  Net -600 ml   LBM: Last BM Date: 12/07/17 Baseline Weight: Weight: 72.6 kg (160 lb 0.9 oz) Most recent weight: Weight: 67.9 kg (149 lb 11.1 oz)       Palliative Assessment/Data: PPS: 10%    Flowsheet Rows     Most Recent Value  Intake Tab  Referral Department  Critical care  Unit at Time of Referral  ICU  Palliative Care Primary Diagnosis  Cancer  Date Notified  11/30/17  Palliative Care Type  New Palliative care  Reason for referral  Clarify Goals of Care  Date of Admission  12/08/2017  Date first seen by Palliative Care  12/01/17  # of days Palliative referral response time  1 Day(s)  # of days IP prior to Palliative referral  4  Clinical Assessment  Palliative Performance Scale Score  40%  Psychosocial & Spiritual Assessment  Palliative Care Outcomes  Patient/Family meeting held?  Yes  Who was at the meeting?  patient and sister [name])   Palliative Care Outcomes  Clarified goals of care, Provided end of life care assistance, Provided psychosocial or spiritual support, ACP counseling assistance, Improved pain interventions, Improved non-pain symptom therapy, Counseled regarding hospice      Patient Active Problem List   Diagnosis Date Noted  . Terminal care   . Anxiety state   . Constipation     . Palliative care encounter   . Acute on chronic respiratory failure ([place])   . Pneumonia due to infectious organism   . Shortness of breath   . Acute respiratory failure with hypercapnia ([place])   . HCAP (healthcare-associated pneumonia)   . Atrial flutter ([place]) 11/20/2017  . Atrial fibrillation with RVR ([place])   . Arterial hypotension   . Alcohol abuse   . Palliative care by specialist   . Goals of care, counseling/discussion   . Edema 08/31/2017  . Hypokalemia 08/29/2017  . Acute respiratory failure with hypoxia and hypercapnia ([place]) 08/26/2017  . Pleural effusion on left 08/26/2017  . Collapse of left lung 08/26/2017  . Hyponatremia 08/26/2017  . COPD exacerbation ([place]) 08/26/2017  . Obstructive pneumonia 08/26/2017  . Non-small cell carcinoma of lung ([place])   . Non-small cell cancer of left lung ([place]) 07/13/2017  . Elevated blood pressure reading 07/05/2017    Palliative Care Assessment & Plan   Patient Profile: 61 y.o.malewith past medical history of stroke, COPD, non-small cell lung cancer diagnosed in July 2018, home oxygen 2L, ETOH, and past smokeradmitted on 12/14/2018with shortness of breath.Followed by Dr. 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 Patient was considering starting palliative chemotherapy. This hospitalization, found to have post-obstructive pneumonia and receiving IV antibiotics. Required intubation 12/15 for worsening respiratory distress. Extubated 12/18. Patient decompensated 12/19 AM requiring BiPAP. PCCM met with family for [place]. Temporary intubation versus comfort. Patient and family determined goals of DNR/DNI and comfort care. Palliative medicine consultation placed.  Assessment/Recommendations/Plan   Ativan '2mg'$  q3hr  Morphine '2mg'$  every 4 hours  Continue comfort care measures  If patient survives tonight- will readdress potential residential hospice referral with Celeste  Goals of Care and Additional Recommendations:  Limitations  on Scope of Treatment: Full Comfort Care  Code Status:  DNR  Prognosis:   Hours - Days  Discharge Planning:  Anticipated Hospital Death  Care plan was discussed with patient's sister [name] and significant other- Inez Catalina.  Thank you for allowing  the Palliative Medicine Team to assist in the care of this patient.   Time In: 1500 Time Out: 1545 Total Time 45 mins Prolonged Time Billed no      Greater than 50%  of this time was spent counseling and coordinating care related to the above assessment and plan.  [name], AGNP-C Palliative Medicine   Please contact Palliative Medicine Team phone at [phone] for questions and concerns.

## 2017-12-09 NOTE — Progress Notes (Signed)
Family Medicine Teaching Service Daily Progress Note Intern Pager: [phone]  Patient name: Levi Brown Medical record number: [id number] Date of birth: Apr 23, 1956 Age: 61 y.o. Gender: male  Primary Care Provider: [name], FNP Consultants: CCM  Code Status: Full   Pt Overview and Major Events to Date:  Admitted to [facility] on 12/06/2017   Assessment and Plan: Levi Maring Currieis a 61 y.o.malepresenting with shortness of breath. PMH is significant for non-small cell lung cancer diagnosed in July 2018,HTN,hx tobacco abuse, hx alcohol abuse, COPD  Dyspnea 2/2 terminal NSCLC, worsening Acutely worsening respiratory status this admit with agonal breathing. Patient is comfort measures. Monitors will be discontinued. Requiring morphine for dyspnea, have discontinued bipap. Followed byoncology Dr. Inda Merlin, had the maximum radiation treatment and not candidate for chemotherapy. -Palliative following, appreciate recommendations: comfort care, discontinue bipap, symptoms management with morphine, ativan, and robinul, comfort feeds. Have discontinued monitors. VS daily. Anticipate death within hours to days -Continue Sublingual morphine on schedule -Continue ativan as needed for anxiety  -[place] as respiratory support instead of bipap since he seems aggitated by bipap -Continue robinul for secretion management  Insomnia Patient previously on Ambien which caused worsening confusion and AMS.  -Avoid ambien -have discontinued trazadone 25 mg qhs prn per palliative  Agitation/AMS  Likely secondary to worsening respiratory status. -Continue ativan as needed. -bipap seems to cause aggitation so using [place] for respiratory support  Atrial Flutter Currently still intermittently tachycardic HR to 110-120s -have discontinued amiodarone 200mg  oral  COPD, ?HCAP Chronic.30 pack year history, quit 4 years ago. Requiring 6L O2 currently from baseline 2L. Completed treatment for a possible post  obstructive HCAP. -albuterolnebs PRN -cont to monitor pulse ox -s/p Zosyn (12/16-12/21), Augmentin 875-125mg  BID (12/21-12/23), has more than completed 7 day course.  Severe Protein Calorie Malnutrition Patient has [place] and has been requiring boost supplementation.  -cont boost supplements  Constipation Resolved with enema -dulcolax suppository prn   FEN/GI: Dysphagia 1 diet PPx: none, transitioning to comfort  Disposition: will remain in hospital   Subjective:  Patient today laying in bed with girlfriend and family member at bedside. Per family/girfriend patient is in better spirits and smiling more. Patient's GF was distraught that patient was placed back on bipap overnight despite clear instructions from family that patient should not be placed back on bipap. This was discussed with day nurse and advised that palliative recs of discontinuing bipap.   Objective: Temp:  [98 F (36.7 C)-98.1 F (36.7 C)] 98 F (36.7 C) (12/26 2300) Pulse Rate:  [124] 124 (12/26 2300) BP: (113-129)/(80-82) 113/80 (12/26 2300) SpO2:  [93 %-98 %] 93 % (12/26 2300) Physical Exam: General: awake, laying in bed, smiling  Cardiovascular: RRR, no MRG Respiratory: CTAB, no wheezes, rales, or rhonchi  Abdomen: soft, non distended, bowel sounds normal  Extremities: no edema   Laboratory: No results for input(s): WBC, HGB, HCT, PLT in the last 168 hours. No results for input(s): NA, K, CL, CO2, BUN, CREATININE, CALCIUM, PROT, BILITOT, ALKPHOS, ALT, AST, GLUCOSE in the last 168 hours.  Invalid input(s): LABALBU   Imaging/Diagnostic Tests: Dg Chest 2 View  Result Date: 11/13/2017 CLINICAL DATA:  Shortness of Breath.  Lung cancer. EXAM: CHEST  2 VIEW COMPARISON:  CT 09/03/2017.  Chest x-ray 09/01/2017 FINDINGS: Large left upper lobe mass noted. Elevation of the left hemidiaphragm with small left effusion. No focal opacity on the right. Cardiomegaly. Fullness in the right paratracheal region is  related to a right aortic arch as seen on  prior CT IMPRESSION: Large left upper lobe mass again noted. Elevated left hemidiaphragm with small left effusion. Cardiomegaly.  Right aortic arch. Electronically Signed   By: Rolm Baptise M.D.   On: 11/13/2017 13:08   Ct Angio Chest Pe W Or Wo Contrast  Result Date: 11/27/2017 CLINICAL DATA:  Shortness of breath, tachycardia EXAM: CT ANGIOGRAPHY CHEST WITH CONTRAST TECHNIQUE: Multidetector CT imaging of the chest was performed using the standard protocol during bolus administration of intravenous contrast. Multiplanar CT image reconstructions and MIPs were obtained to evaluate the vascular anatomy. CONTRAST:  74mL ISOVUE-370 IOPAMIDOL (ISOVUE-370) INJECTION 76% COMPARISON:  11/13/2017 FINDINGS: Cardiovascular: No filling defects in the pulmonary artery is to suggest pulmonary emboli. Right side aortic arch noted. There is a retroesophageal left subclavian artery. Cardiomegaly, small pericardial effusion. 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 Left pulmonary artery is markedly narrowed by the left upper lobe mass, stable. Mediastinum/Nodes: No visible measurable mediastinal, right hilar or axillary adenopathy. Lungs/Pleura: Large anterior left upper lung and mediastinal mass is again noted measuring 9.0 x 6.6 cm compared to 9.0 x 6.2 cm previously, likely not significantly changed. Large left pleural effusion now noted, increased since prior study compressive atelectasis or pneumonia noted in the lingula and left lower lobe with only a small amount of aerated left upper lobe noted. Underlying emphysema. Small right pleural effusion is new since prior study. Upper Abdomen: Imaging into the upper abdomen shows no acute findings. Musculoskeletal: Chest wall soft tissues are unremarkable. No acute bony abnormality. Review of the MIP images confirms the above findings. IMPRESSION: Large left upper lobe mass again noted, invading the  mediastinum, unchanged since prior study. There is enlarging large left effusion and new small right effusion. Only a small amount of aerated left upper lobe now present. Airspace disease within the lingula and left lower lobe could reflect atelectasis or pneumonia. Cardiomegaly, small pericardial effusion. Severe attenuation/narrowing of the left upper lobe pulmonary artery by the left upper lobe mass. No evidence of pulmonary embolus. 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 Recommend attention on follow-up imaging. Electronically Signed   By: Rolm Baptise M.D.   On: 11/27/2017 16:10   Ct Angio Chest Pe W And/or Wo Contrast  Result Date: 11/13/2017 CLINICAL DATA:  Fatigue and shortness of breath. History of non-small-cell lung cancer EXAM: CT ANGIOGRAPHY CHEST WITH CONTRAST TECHNIQUE: Multidetector CT imaging of the chest was performed using the standard protocol during bolus administration of intravenous contrast. Multiplanar CT image reconstructions and MIPs were obtained to evaluate the vascular anatomy. CONTRAST:  189mL ISOVUE-370 IOPAMIDOL (ISOVUE-370) INJECTION 76% COMPARISON:  09/03/2017 FINDINGS: Cardiovascular: Heart is enlarged. 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 There is no filling defect within the opacified pulmonary arteries to suggest the presence of an acute pulmonary embolus. The left main pulmonary artery is dramatically attenuated by the mass in the left upper lung and the pulmonary artery to the left upper lobe is occluded. Mediastinum/Nodes: Stable 11 mm short axis subcarinal lymph node. No right hilar lymphadenopathy. The esophagus has normal imaging features. There is no axillary lymphadenopathy. Lungs/Pleura: Anterior left upper lung and mediastinal mass has decreased in the interval measuring 9 x 6.2 cm today compared to 10 x 7.3 cm previously. Small left pleural effusion on today's  study has decreased. Left upper lobe collapse again noted. There is dramatic mass-effect on the left lower lobe bronchus and  left upper lobe bronchus is obliterated. Upper Abdomen: Unremarkable. Musculoskeletal: Bone windows reveal no worrisome lytic or sclerotic osseous lesions. Review of the MIP images confirms the above findings. IMPRESSION: 1. No CT evidence for acute pulmonary embolus. 2. Interval decrease in large left upper lung mass invading the mediastinum. The mass lesion markedly attenuates the left main pulmonary artery and likely obliterates the left upper lobe pulmonary arterial branch. Lesion also generates substantial mass-effect on the airway to the left lung with obliteration of the left upper lobe bronchus and marked attenuation of the airway to the left lower lobe. 3. 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 4. Interval decrease in left pleural effusion. Electronically Signed   By: Misty Stanley M.D.   On: 11/13/2017 13:12   Ct Abdomen W Contrast  Result Date: 11/17/2017 CLINICAL DATA:  Non-small cell lung cancer. EXAM: CT ABDOMEN WITH CONTRAST TECHNIQUE: Multidetector CT imaging of the abdomen was performed using the standard protocol following bolus administration of intravenous contrast. CONTRAST:  1105mL ISOVUE-300 IOPAMIDOL (ISOVUE-300) INJECTION 61% COMPARISON:  CT chest 11/13/2017 and PET 07/29/2017. CT abdomen pelvis 07/06/2017. FINDINGS: Lower chest: Small left pleural effusion, partially imaged. Left hemidiaphragm is elevated. Heart is at the upper limits of normal in size. Small amount of pericardial fluid may be physiologic. Hepatobiliary: Liver measures at the upper limits of normal, 18.0 cm. Liver and gallbladder are otherwise unremarkable. No biliary ductal dilatation. Pancreas: Negative. Spleen: Negative. Adrenals/Urinary Tract: Right adrenal gland is unremarkable. Thickening of the medial limb left adrenal gland. Kidneys are  unremarkable. Stomach/Bowel: Stomach is decompressed. Visualized portions of the small bowel and colon are unremarkable. Vascular/Lymphatic: Atherosclerotic calcification of the arterial vasculature without abdominal aortic aneurysm. No pathologically enlarged lymph nodes. Other: No free fluid.  Mesenteries and peritoneum are unremarkable. Musculoskeletal: No worrisome lytic or sclerotic lesions. IMPRESSION: 1. Small pleural effusion, partially imaged.  Up 2.  Aortic atherosclerosis (ICD10-170.0). Electronically Signed   By: Lorin Picket M.D.   On: 11/17/2017 15:33   Dg Chest Port 1 View  Result Date: 12/02/2017 CLINICAL DATA:  Shortness of breath and respiratory failure EXAM: PORTABLE CHEST 1 VIEW COMPARISON:  Yesterday FINDINGS: There is a right IJ central line with tip at the SVC level. Catheter is distorted by a right-sided aortic arch that is also aneurysmal. Left-sided mediastinal mass/malignancy. Elevated left diaphragm with atelectasis. Interstitial edema seen previously is improved. No pneumothorax. Cardiomegaly. IMPRESSION: 1. Improved pulmonary edema. 2. Left mediastinal mass and volume loss. Electronically Signed   By: Monte Fantasia M.D.   On: 12/02/2017 08:10   Dg Chest Port 1 View  Result Date: 12/01/2017 CLINICAL DATA:  Acute respiratory failure with hypercapnia EXAM: PORTABLE CHEST 1 VIEW COMPARISON:  Portable chest x-ray of November 29, 2017 FINDINGS: There has been interval extubation of the trachea and of the esophagus. The right lung is well-expanded. The interstitial markings remain increased in the mid and lower right lung and there is a small right pleural effusion. On the left the hemidiaphragm remains elevated. The large left upper lobe mass persists. Pleural fluid on the left is slightly less conspicuous today. The cardiac silhouette remains enlarged. The mediastinum remains widened. The right internal jugular venous catheter tip projects over the proximal SVC. IMPRESSION:  Interval extubation of the trachea and of the esophagus. Persistent volume loss on the left with basilar atelectasis or pneumonia. Increased density in the right mid and lower lung is consistent with interstitial edema or infiltrate. Persistent large  left upper lobe mass with mediastinal widening. Electronically Signed   By: David  Martinique M.D.   On: 12/01/2017 09:10   Dg Chest Port 1 View  Result Date: 11/29/2017 CLINICAL DATA:  Respiratory failure, intubated patient. History of CVA, lung malignancy, former smoker. EXAM: PORTABLE CHEST 1 VIEW COMPARISON:  Portable chest x-ray of November 28, 2017 FINDINGS: The right lung is well-expanded. The inflation of the left lung remains decreased due to an elevated hemidiaphragm. There are small bilateral pleural effusions. Interstitial density in the lower 1/2 of the right lung persist but some clearing of the right upper lobe has occurred. There remains soft tissue fullness obscuring the left hilar region. The left heart border is obscured. The endotracheal tube tip lies 3 cm above the carina. The esophagogastric tube tip in proximal port project below the left hemidiaphragm. The right internal jugular venous catheter tip projects over the junction of the proximal and midportions of the SVC. IMPRESSION: The support tubes are in reasonable position. Mild interval improvement in the appearance of the pulmonary interstitium bilaterally suggests decreasing interstitial edema or less likely pneumonia. Persistent atelectasis or interstitial pneumonia noted at the right lung base. There is chronic elevation of the left hemidiaphragm with atelectasis or infiltrate in the retrocardiac region. Small bilateral pleural effusions. Electronically Signed   By: David  Martinique M.D.   On: 11/29/2017 08:05   Dg Chest Port 1 View  Result Date: 11/28/2017 CLINICAL DATA:  Initial evaluation status post central line placement. EXAM: PORTABLE CHEST 1 VIEW COMPARISON:  Prior radiograph  from 11/27/2017. FINDINGS: Patient remains intubated with the tip of the endotracheal tube positioned approximately 2 cm above the carina. Enteric tube courses in the abdomen, side hole beyond the GE junction. There has been interval placement of a right IJ approach centra venous catheter with tip overlying the mid SVC allowing for patient rotation. Defibrillator pads in place. Stable cardiomegaly. Mediastinal silhouette unchanged. Elevated left hemidiaphragm. Small bilateral pleural effusions. Persistent hazy bibasilar opacities, which may reflect atelectasis or infiltrates. Underlying mild pulmonary interstitial congestion. No pneumothorax. Osseous structures unchanged. IMPRESSION: 1. Interval placement of right IJ approach centra venous catheter with tip overlying the mid SVC. 2. Remaining support apparatus in good position. 3. Persistent elevation of the left hemidiaphragm with small bilateral pleural effusions. Associated bibasilar opacities may reflect atelectasis or infiltrates. 4. Underlying mild pulmonary interstitial congestion. Electronically Signed   By: Jeannine Boga M.D.   On: 11/28/2017 05:36   Dg Chest Port 1 View  Result Date: 11/27/2017 CLINICAL DATA:  ETT placement EXAM: PORTABLE CHEST 1 VIEW COMPARISON:  Chest x-ray November 26, 2017 FINDINGS: The ETT terminates 2.6 cm above the carina in good position. Left chest is partially obscured by a transcutaneous pacer. No pneumothorax. The left hemidiaphragm is elevated. There is a left effusion with underlying opacity. Minimal opacity in the right base is seen as well. No other acute abnormalities. The NG tube terminates in the stomach. IMPRESSION: 1. The support apparatus is in good position. 2. Elevated left hemidiaphragm. There is a left effusion with underlying atelectasis. 3. Mild hazy opacity in the right base. 4. Electronically Signed   By: Dorise Bullion III M.D   On: 11/27/2017 18:06   Dg Chest Port 1 View  Result Date:  12/06/2017 CLINICAL DATA:  Shortness of breath EXAM: PORTABLE CHEST 1 VIEW COMPARISON:  11/13/2017 FINDINGS: The cardio pericardial silhouette is enlarged. Asymmetric elevation left hemidiaphragm. Large left hilar and mediastinal lung mass again noted. Pleural effusion again  noted on the left. Right lung relatively clear. The visualized bony structures of the thorax are intact. Telemetry leads overlie the chest. IMPRESSION: No substantial change. Asymmetric elevation left hemidiaphragm with large left hilar/mediastinal mass and left base collapse/consolidation with pleural effusion. Electronically Signed   By: Misty Stanley M.D.   On: 11/24/2017 12:19   Dg Swallowing Func-speech Pathology  Result Date: 12/05/2017 Objective Swallowing Evaluation: Type of Study: MBS-Modified Barium Swallow Study  Patient Details Name: MUAAD BOEHNING MRN: [id number] Date of Birth: May 05, 1956 Today's Date: 12/05/2017 Time: SLP Start Time (ACUTE ONLY): 1000 -SLP Stop Time (ACUTE ONLY): 1025 SLP Time Calculation (min) (ACUTE ONLY): 25 min Past Medical History: Past Medical History: Diagnosis Date . Former smoker   Quit smoking in early 2017 . Non-small cell cancer of left lung ([place]) 07/13/2017 . Stroke [facility]) 07/07/2017  [place] - pt unaware, but imaging suggests old strokes Past Surgical History: Past Surgical History: Procedure Laterality Date . VIDEO BRONCHOSCOPY Bilateral 07/07/2017  Procedure: VIDEO BRONCHOSCOPY WITHOUT FLUORO;  Surgeon: Collene Gobble, MD;  Location: [facility] ENDOSCOPY;  Service: Cardiopulmonary;  Laterality: Bilateral; HPI: 60 y.o.malewith past medical history of stroke, COPD, non-small cell lung cancer diagnosed in July 2018, home oxygen 2L, ETOH, and past smokeradmitted on 12/14/2018with shortness of breath.Followed by Dr. 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 Patient was considering starting palliative chemotherapy. This hospitalization, found to have post-obstructive pneumonia and receiving IV  antibiotics. Required intubation 12/15 for worsening respiratory distress. Extubated 12/18. Patient decompensated 12/19 AM requiring BiPAP. Patient and family determined goals of DNR/DNI and comfort care. Palliative following.  Subjective: "can i have some water?" Assessment / Plan / Recommendation CHL IP CLINICAL IMPRESSIONS 12/05/2017 Clinical Impression Patient presents with moderate oral and mild pharyngeal dysphagia due to cognitive/sensory deficits. Oral stage characterized by bolus holding, prolonged bolus formation, and significantly delayed anterior to posterior transit. Premature spillage of liquids to pyriform sinuses before the swallow. This resulted in 2 instances of aspiration with thin liquids which spilled into the airway prior to laryngeal closure and were aspirated during the swallow. Pt sensed aspiration events, however cough is weak and was not protective. Swallow initiation timely at the base of tongue/valleculae for solids. Trace, shallow penetration of larger sip of nectar thick liquid x1. With solids, pt required several minutes for mastication and oral transit. Mild vallecular residue clears with wash of nectar thick liquid. Will initiate dys 2 with nectar thick liquids, meds crushed in applesauce. Supervision/assistance for slow rate, small bites and sips. If pt exhibiting difficulty with solids, may downgrade to dys 1 to reduce work of eating. Suspect pt's dysphagia is more likely chronic than related to intubation, however given pt's respiratory status, acute illness and weak cough post-extubation, short-term modification of liquids to nectar recommended. Will follow for tolerance at bedside and for advancement of liquids pending improvements in cough strength and respiratory status. SLP Visit Diagnosis Dysphagia, oral phase (R13.11) Attention and concentration deficit following -- Frontal lobe and executive function deficit following -- Impact on safety and function Mild aspiration risk    CHL IP TREATMENT RECOMMENDATION 12/05/2017 Treatment Recommendations Therapy as outlined in treatment plan below   Prognosis 12/05/2017 Prognosis for Safe Diet Advancement Good Barriers to Reach Goals Other (Comment);Cognitive deficits Barriers/Prognosis Comment -- CHL IP DIET RECOMMENDATION 12/05/2017 SLP Diet Recommendations Dysphagia 2 (Fine chop) solids;Nectar thick liquid Liquid Administration via Cup;Straw Medication Administration Crushed with puree Compensations Slow rate;Small sips/bites Postural Changes --   CHL IP OTHER RECOMMENDATIONS 12/05/2017 Recommended Consults --  Oral Care Recommendations Oral care BID Other Recommendations Have oral suction available   CHL IP FOLLOW UP RECOMMENDATIONS 12/05/2017 Follow up Recommendations Other (comment)   CHL IP FREQUENCY AND DURATION 12/05/2017 Speech Therapy Frequency (ACUTE ONLY) min 2x/week Treatment Duration 2 weeks      CHL IP ORAL PHASE 12/05/2017 Oral Phase Impaired Oral - Pudding Teaspoon -- Oral - Pudding Cup -- Oral - Honey Teaspoon Reduced posterior propulsion;Holding of bolus;Lingual pumping;Premature spillage;Delayed oral transit Oral - Honey Cup -- Oral - Nectar Teaspoon Lingual pumping;Reduced posterior propulsion;Holding of bolus;Delayed oral transit;Premature spillage Oral - Nectar Cup Lingual pumping;Delayed oral transit;Premature spillage;Reduced posterior propulsion;Holding of bolus Oral - Nectar Straw Lingual pumping;Holding of bolus;Reduced posterior propulsion;Delayed oral transit;Premature spillage Oral - Thin Teaspoon Lingual pumping;Holding of bolus;Reduced posterior propulsion;Delayed oral transit;Premature spillage Oral - Thin Cup Lingual pumping;Reduced posterior propulsion;Holding of bolus;Delayed oral transit;Premature spillage Oral - Thin Straw Lingual pumping;Reduced posterior propulsion;Holding of bolus;Delayed oral transit;Premature spillage Oral - Puree Lingual pumping;Impaired mastication;Reduced posterior propulsion;Holding  of bolus;Delayed oral transit Oral - Mech Soft -- Oral - Regular Impaired mastication;Lingual pumping;Holding of bolus;Reduced posterior propulsion;Delayed oral transit Oral - Multi-Consistency -- Oral - Pill Impaired mastication;Lingual pumping;Holding of bolus;Reduced posterior propulsion;Delayed oral transit Oral Phase - Comment --  CHL IP PHARYNGEAL PHASE 12/05/2017 Pharyngeal Phase Impaired Pharyngeal- Pudding Teaspoon -- Pharyngeal -- Pharyngeal- Pudding Cup -- Pharyngeal -- Pharyngeal- Honey Teaspoon -- Pharyngeal -- Pharyngeal- Honey Cup -- Pharyngeal -- Pharyngeal- Nectar Teaspoon -- Pharyngeal -- Pharyngeal- Nectar Cup Delayed swallow initiation-pyriform sinuses;Penetration/Aspiration during swallow Pharyngeal Material enters airway, remains ABOVE vocal cords and not ejected out Pharyngeal- Nectar Straw -- Pharyngeal -- Pharyngeal- Thin Teaspoon Delayed swallow initiation-pyriform sinuses;Penetration/Aspiration before swallow;Trace aspiration;Penetration/Aspiration during swallow Pharyngeal Material enters airway, passes BELOW cords and not ejected out despite cough attempt by patient Pharyngeal- Thin Cup Delayed swallow initiation-pyriform sinuses Pharyngeal -- Pharyngeal- Thin Straw Delayed swallow initiation-pyriform sinuses;Penetration/Aspiration before swallow;Penetration/Aspiration during swallow;Moderate aspiration Pharyngeal Material enters airway, passes BELOW cords and not ejected out despite cough attempt by patient Pharyngeal- Puree -- Pharyngeal -- Pharyngeal- Mechanical Soft -- Pharyngeal -- Pharyngeal- Regular -- Pharyngeal -- Pharyngeal- Multi-consistency -- Pharyngeal -- Pharyngeal- Pill -- Pharyngeal -- Pharyngeal Comment --  CHL IP CERVICAL ESOPHAGEAL PHASE 12/05/2017 Cervical Esophageal Phase WFL Pudding Teaspoon -- Pudding Cup -- Honey Teaspoon -- Honey Cup -- Nectar Teaspoon -- Nectar Cup -- Nectar Straw -- Thin Teaspoon -- Thin Cup -- Thin Straw -- Puree -- Mechanical Soft -- Regular  -- Multi-consistency -- Pill -- Cervical Esophageal Comment -- Deneise Lever, MS, CCC-SLP Speech-Language Pathologist [phone] No flowsheet data found. Aliene Altes 12/05/2017, 1:43 PM                Caroline More, DO 12/09/2017, 12:02 PM PGY-1, [place] Intern pager: [phone], text pages welcome

## 2017-12-09 NOTE — Progress Notes (Signed)
Palliative Medicine RN Note: Arrived for symptom check & to update sister. Girlfriend remains at bedside. Pt is agitated and restless. Got morphine and ativan less than an hour ago. Pt is able to shake his head "no" when asked about pain, trouble breathing, but he has resp rate of 24, labored breathing, and is sweating. Offered another dose of morphine, but his sister refused, stating, "let's see how he is doing in a few minutes." We discussed the bipap and how much he disliked it/continuously pulled on it. They confirm that they want him comfortable, but sister [name] disengaged from the conversation about goals and death.  Family expressed frustrations about some care issues. Unit director [name] came into room and met with family, and she arranged a sitter to start immediately. We discussed with family the idea of hospice in the setting of a patient who needs frequent checks for symptoms; pt is on a progressive cardiac unit, and he would receive care more in line with comfort goals at an inpt hospice. Family did not agree to a hospice consult but did agree to talk to a hospice rep to see what they could offer. Spoke with Dr Hilma Favors, who adjusted morphine and scheduled ativan. Pt had a BM, so pt rec'd a prn dose of morphine as a pre-med for personal care.  I called [name] with [facility], who will come see the patient. 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 [name] reports she was told much the same and that another sister is arriving tomorrow; they will start to consider things then.   Spoke with teaching service about possible move to [place], as they frequently get EOL patients; they will put in orders. I spoke with family about [facility] and that moving there does not mean there will be de-escalation in care or changes to care plan. During my discussion, [name] became more and more agitated. I called NP [name] to come see patient. Please see  her notes for subsequent changes & discussions.  [name] [name], RN, BSN, [facility] 12/09/2017 4:32 PM Office [phone]

## 2017-12-14 NOTE — Progress Notes (Signed)
Patient expired at 00:17.  On call Dr. Martinique made aware.  Family notified (Worthy Winslow). [facility] informed.  Patient will be transported to the morgue shortly.

## 2017-12-14 NOTE — Discharge Summary (Addendum)
Family Medicine Teaching Service Death Summary  Patient name: Levi Brown Medical record number: [id number] Date of birth: 1956-11-12 Age: 62 y.o. Gender: male Date of Admission: December 17, 2017  Date of Death: 12/31/2017 Admitting Physician: Leeanne Rio, MD  Primary Care Provider: [name], FNP Consultants: CCM, cardiology   Manner of Death: Natural Cause of death: Metastatic non-small cell lung cancer Autopsy performed: No Medical Examiner consulted: No Tobacco involved in cause of disease leading to death: Yes  Indication for Hospitalization: Shortness of Breath  Brief Hospital Course:  [name] is a 62 y.o. presenting initially on 12-17-2017 for SOB. Patient was sent to ED by his PCP after found to be tachycardic in PCP office, and was seen in ED to be in a flutter. Patient had chronic O2 need of 2L due to Stage IV non-small cell lung cancer, and needed to be on supplemental O2 during hospitalization as well.   Cardiology was consulted for atrial flutter. Recommended amiodarone. Patient on 12/18/2023 required increased oxygen need and needed bipap. Patient was transferred to [facility]. Patient was then transferred out of ICU back to primary team within next day. Patient however quickly decompensated and required re-admission to care of CCM.   While in CCM care patient found to have post obstructive pneumonia and pleural effusions. Antibiotics (Vanc, Zosyn, Augmentin) were started and completed a 10 day course. While in CCM patient became unstable and was not able to maintain his airway and required intubation. He was found to have large pulmonary edema with interstitial edema. Patient found to be anemic while in CCM. Was given 1 U PRBCs. CCM continued management for this patient until 12/17 when he was extubated. CCM then had discussions with family as Levi Brown's prognosis was very poor for recovery. Oncology was consulted and they said that chemotherapy would not be  beneficial and he was not a candidate at the time due to his unstable condition.  He had already received the maximum dose of radiation therapy. Patient and family were informed of his deteriorating condition and palliative was consulted. Family wanted comfort measures to be taken but no move to comfort care, at this time he was made DNR. Patient was requiring BiPAP but was for comfort only. His conditioned did not improve while in the hospital and he became less responsive over the course of several days requiring morphine for air hunger and BiPAP to breath. He expired at 00:17 on 12-31-2017 as a natural result of complications due to his NSCLC.  Significant Procedures: Required Intubation from 12/15-12/17     Nuala Alpha, DO 12/11/2017, 5:41 PM PGY-1, [place], MD 12/13/17 [place] Attending Family Medicine Teaching Service

## 2017-12-14 DEATH — deceased

## 2018-01-01 ENCOUNTER — Other Ambulatory Visit: Payer: Self-pay | Admitting: Nurse Practitioner

## 2019-06-30 IMAGING — CT CT CHEST W/ CM
2 of 3 series · 15 of 36 positions shown, 18 images · IV contrast (APPLIED)
Comparison: Chest x-ray obtained earlier today

CLINICAL DATA: 60-year-old male with shortness of breath for the
past month and hoarseness for the past month and a half

EXAM:
CT CHEST WITH CONTRAST
TECHNIQUE: Multidetector CT imaging of the chest was performed during
intravenous contrast administration.
CONTRAST:  100mL A6M10D-H88 IOPAMIDOL (A6M10D-H88) INJECTION 61%

[Series 4: thorax 2.0 i31f 2 · axial · 0.70mm/px · z∈[+1064,+1380]mm · 12 of 186 slices shown, 15 images]
[im 14/186  mediastinal]
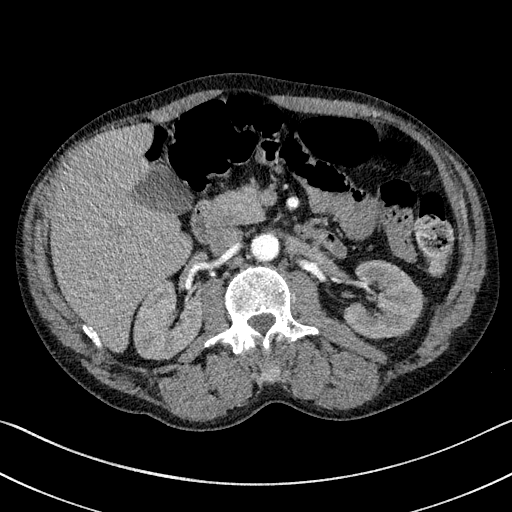
[im 14/186  lung]
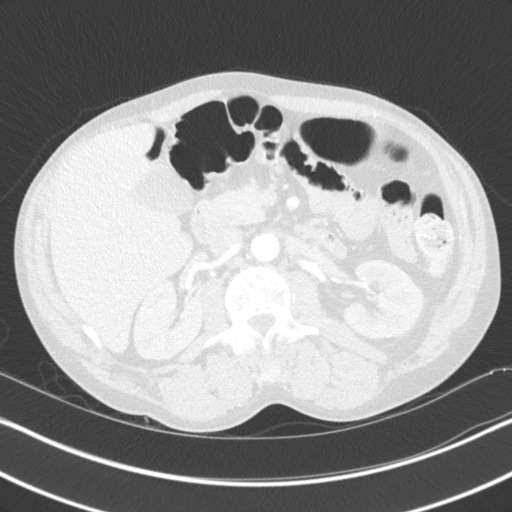
[im 28/186  lung]
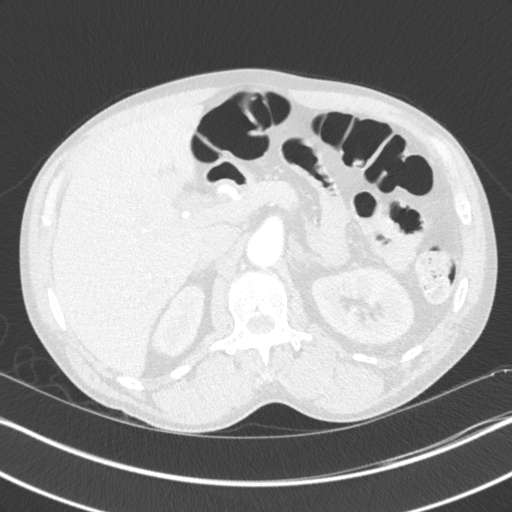
[im 42/186  lung]
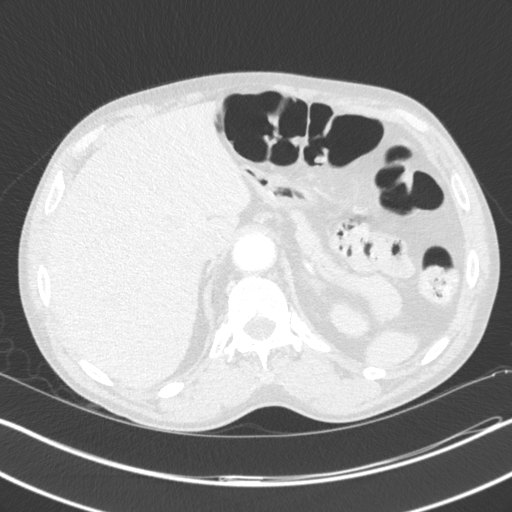
[im 55/186  lung]
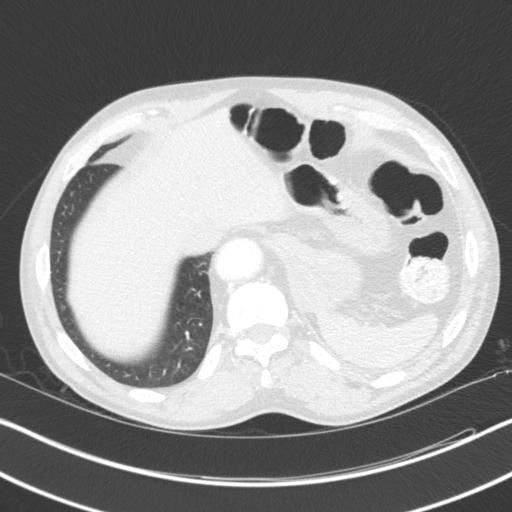
[im 69/186  mediastinal]
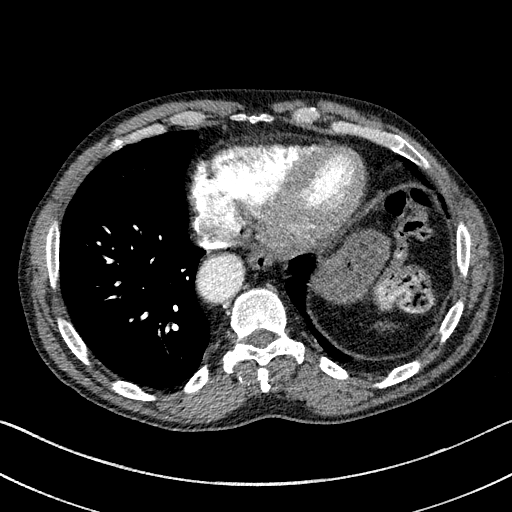
[im 69/186  lung]
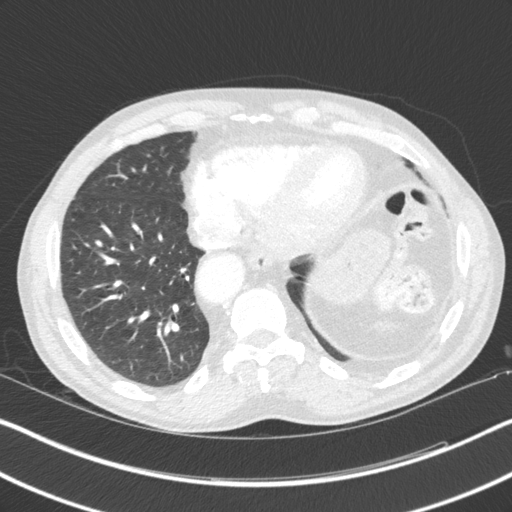
[im 83/186  lung]
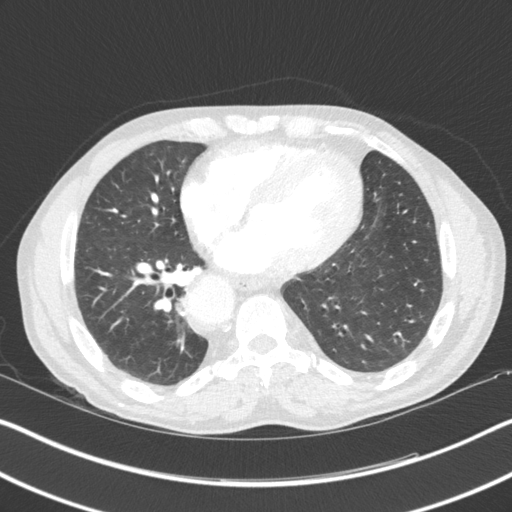
[im 103/186  lung]
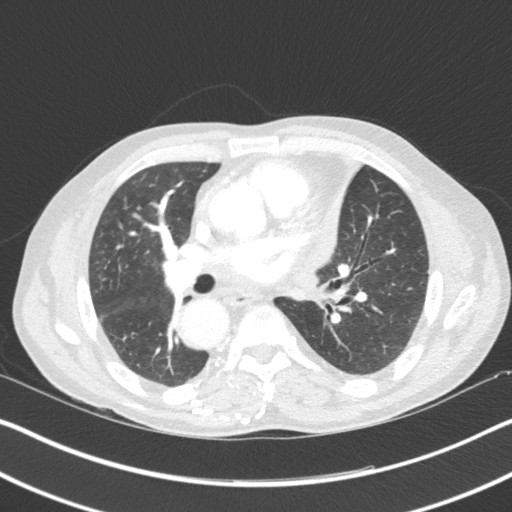
[im 117/186  lung]
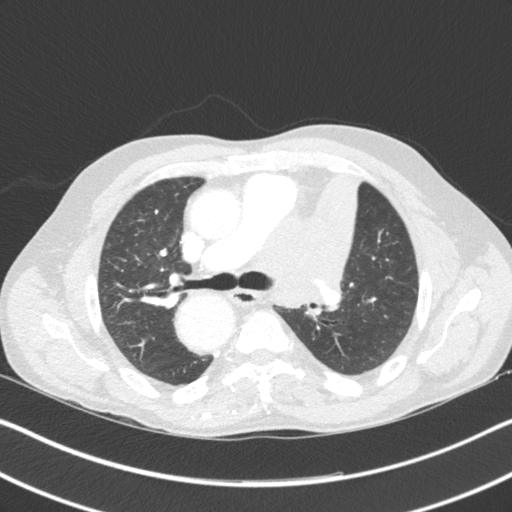
[im 131/186  mediastinal]
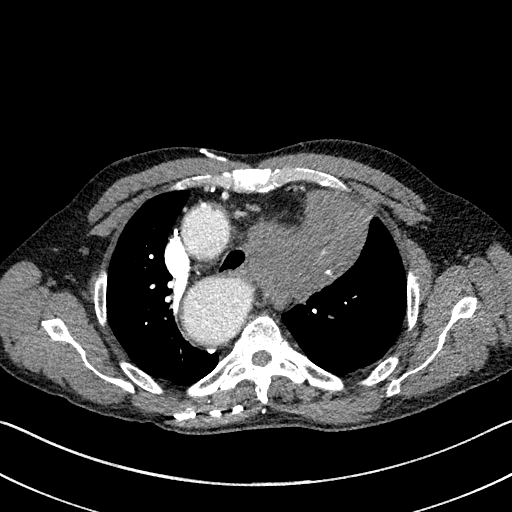
[im 131/186  lung]
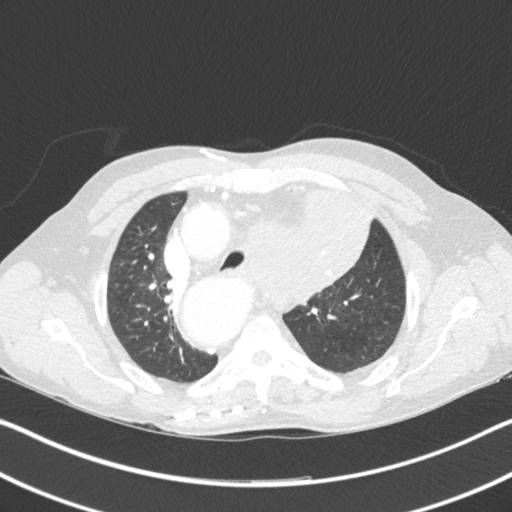
[im 144/186  lung]
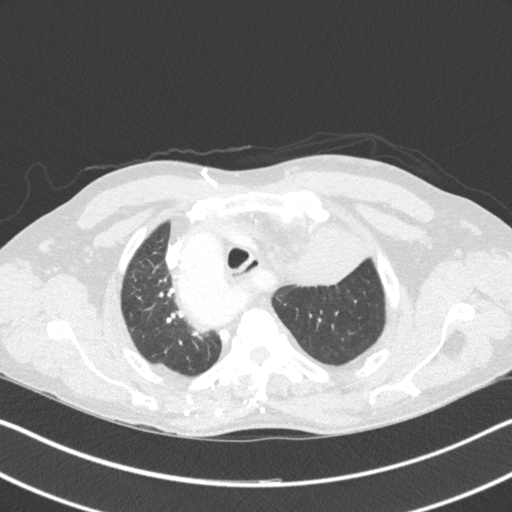
[im 158/186  lung]
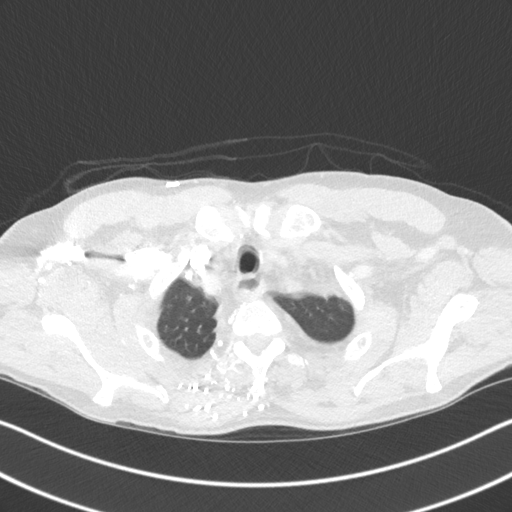
[im 172/186  lung]
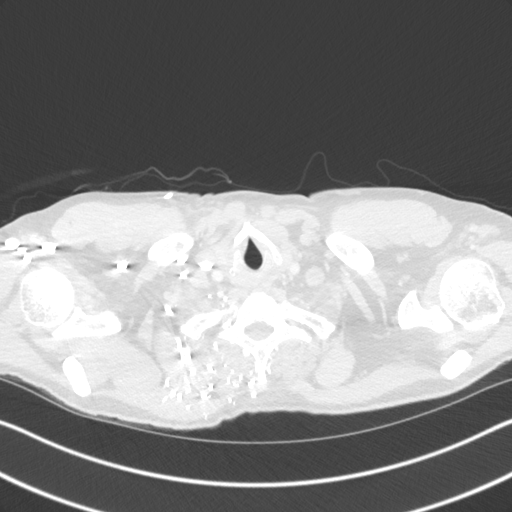

[Series 5: coronal · coronal · 0.73mm/px · 3 of 135 slices shown]
[im 27/135  lung]
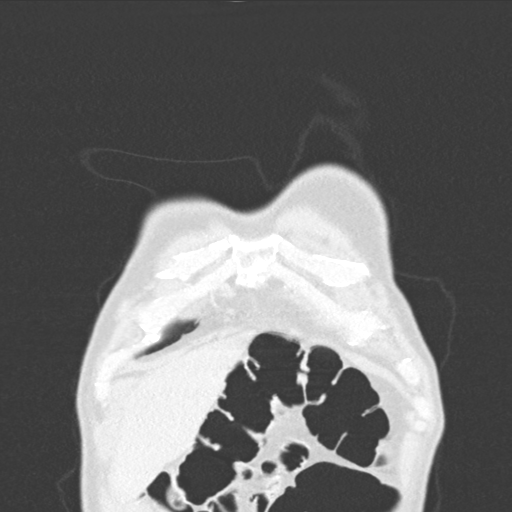
[im 54/135  lung]
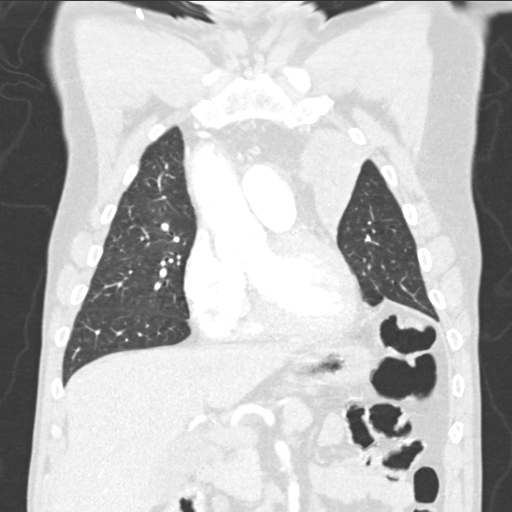
[im 81/135  lung]
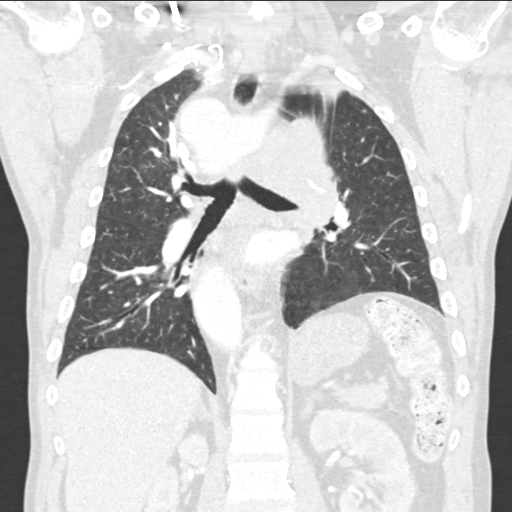

[15 of 36 positions shown; findings below may reference images not displayed]

FINDINGS: Cardiovascular: Right-sided aortic arch with aberrant left
subclavian artery. Diffuse narrowing of the left main pulmonary
artery secondary to local mass effect from the mediastinal mass. No
evidence of aortic aneurysm. The heart is normal in size. No
pericardial effusion.

Mediastinum/Nodes: Ill-defined mediastinal mass which is difficult
to measure precisely. The mass measures at least 6.5 x 5.2 x 6.6 cm
and is contiguous with collapse of the left upper lobe. The mass
completely obliterates the left upper lobe bronchus and likely
impinges upon the left lower lobe bronchus. No additional
mediastinal adenopathy identified.

Lungs/Pleura: Complete collapse of the left upper lobe. There is no
aeration within the left upper lobe. Secondary hyperinflation of the
left lower lobe. The aerated portion of the lungs are clear. No
pulmonary mass or nodules.

Upper Abdomen: Visualized upper abdominal organs are unremarkable.

Musculoskeletal: No acute fracture or aggressive appearing lytic or
blastic osseous lesion.
IMPRESSION: 1. Large, amorphous approximately 6.6 x 6.5 x 5.2 cm mass centered
in the left mediastinum which completely obliterates the left upper
lobe bronchus and impinges into the left lower lobe bronchus. There
is also mass effect and significant narrowing of the left main
pulmonary artery. Differential considerations include lymphoma,
thymic malignancy, and potentially small cell lung cancer. No
evidence of distant metastatic disease.
2. Complete collapse of the left upper lobe. It is difficult to
identify where the primary mediastinal mass ends and atelectatic
lung begins. Direct invasion into the lung is not excluded.
3. Right sided aortic arch with aberrant left subclavian artery.
